# Patient Record
Sex: Male | Born: 1935 | Race: White | Hispanic: No | Marital: Married | Smoking: Former smoker
Health system: Southern US, Community
[De-identification: ages and names within clinical notes are randomized; demographics above are authoritative.]

## PROBLEM LIST (undated history)

## (undated) DIAGNOSIS — J449 Chronic obstructive pulmonary disease, unspecified: Secondary | ICD-10-CM

## (undated) DIAGNOSIS — Z85820 Personal history of malignant melanoma of skin: Secondary | ICD-10-CM

## (undated) DIAGNOSIS — I10 Essential (primary) hypertension: Secondary | ICD-10-CM

## (undated) DIAGNOSIS — G8194 Hemiplegia, unspecified affecting left nondominant side: Secondary | ICD-10-CM

## (undated) DIAGNOSIS — M25559 Pain in unspecified hip: Secondary | ICD-10-CM

## (undated) DIAGNOSIS — R7881 Bacteremia: Secondary | ICD-10-CM

## (undated) DIAGNOSIS — I255 Ischemic cardiomyopathy: Secondary | ICD-10-CM

## (undated) DIAGNOSIS — K219 Gastro-esophageal reflux disease without esophagitis: Secondary | ICD-10-CM

## (undated) DIAGNOSIS — E079 Disorder of thyroid, unspecified: Secondary | ICD-10-CM

## (undated) DIAGNOSIS — I639 Cerebral infarction, unspecified: Secondary | ICD-10-CM

## (undated) DIAGNOSIS — R1310 Dysphagia, unspecified: Secondary | ICD-10-CM

## (undated) DIAGNOSIS — G8190 Hemiplegia, unspecified affecting unspecified side: Secondary | ICD-10-CM

## (undated) DIAGNOSIS — E119 Type 2 diabetes mellitus without complications: Secondary | ICD-10-CM

## (undated) DIAGNOSIS — M1990 Unspecified osteoarthritis, unspecified site: Secondary | ICD-10-CM

## (undated) DIAGNOSIS — I4891 Unspecified atrial fibrillation: Secondary | ICD-10-CM

## (undated) DIAGNOSIS — R0789 Other chest pain: Secondary | ICD-10-CM

## (undated) DIAGNOSIS — R21 Rash and other nonspecific skin eruption: Secondary | ICD-10-CM

## (undated) DIAGNOSIS — C801 Malignant (primary) neoplasm, unspecified: Secondary | ICD-10-CM

## (undated) DIAGNOSIS — Z7189 Other specified counseling: Secondary | ICD-10-CM

## (undated) DIAGNOSIS — I2510 Atherosclerotic heart disease of native coronary artery without angina pectoris: Secondary | ICD-10-CM

## (undated) HISTORY — DX: Other specified counseling: Z71.89

## (undated) HISTORY — PX: CORONARY STENT PLACEMENT: SHX1402

## (undated) HISTORY — PX: BACK SURGERY: SHX140

## (undated) HISTORY — PX: SPLENECTOMY: SUR1306

## (undated) HISTORY — DX: Hemiplegia, unspecified affecting left nondominant side: G81.94

## (undated) HISTORY — DX: Rash and other nonspecific skin eruption: R21

## (undated) HISTORY — PX: BRAIN SURGERY: SHX531

## (undated) HISTORY — DX: Pain in unspecified hip: M25.559

## (undated) HISTORY — DX: Other chest pain: R07.89

## (undated) HISTORY — DX: Bacteremia: R78.81

## (undated) HISTORY — PX: CARDIAC SURGERY: SHX584

---

## 2001-10-09 ENCOUNTER — Emergency Department (HOSPITAL_COMMUNITY): Admission: EM | Admit: 2001-10-09 | Discharge: 2001-10-09 | Payer: Self-pay | Admitting: Emergency Medicine

## 2013-03-13 DIAGNOSIS — J209 Acute bronchitis, unspecified: Secondary | ICD-10-CM | POA: Insufficient documentation

## 2013-03-13 DIAGNOSIS — J189 Pneumonia, unspecified organism: Secondary | ICD-10-CM | POA: Insufficient documentation

## 2013-06-16 ENCOUNTER — Encounter (HOSPITAL_COMMUNITY): Payer: Self-pay | Admitting: Emergency Medicine

## 2013-06-16 ENCOUNTER — Emergency Department (HOSPITAL_COMMUNITY)
Admission: EM | Admit: 2013-06-16 | Discharge: 2013-06-16 | Disposition: A | Discharge status: Home / Self Care | Payer: Medicare Other | Attending: Emergency Medicine | Admitting: Emergency Medicine

## 2013-06-16 ENCOUNTER — Emergency Department (HOSPITAL_COMMUNITY): Payer: Medicare Other

## 2013-06-16 DIAGNOSIS — Z87891 Personal history of nicotine dependence: Secondary | ICD-10-CM | POA: Insufficient documentation

## 2013-06-16 DIAGNOSIS — I10 Essential (primary) hypertension: Secondary | ICD-10-CM | POA: Insufficient documentation

## 2013-06-16 DIAGNOSIS — M129 Arthropathy, unspecified: Secondary | ICD-10-CM | POA: Insufficient documentation

## 2013-06-16 DIAGNOSIS — R0600 Dyspnea, unspecified: Secondary | ICD-10-CM

## 2013-06-16 DIAGNOSIS — Z9889 Other specified postprocedural states: Secondary | ICD-10-CM | POA: Insufficient documentation

## 2013-06-16 DIAGNOSIS — J441 Chronic obstructive pulmonary disease with (acute) exacerbation: Secondary | ICD-10-CM | POA: Insufficient documentation

## 2013-06-16 DIAGNOSIS — Z9861 Coronary angioplasty status: Secondary | ICD-10-CM | POA: Insufficient documentation

## 2013-06-16 DIAGNOSIS — R079 Chest pain, unspecified: Secondary | ICD-10-CM

## 2013-06-16 DIAGNOSIS — E119 Type 2 diabetes mellitus without complications: Secondary | ICD-10-CM | POA: Insufficient documentation

## 2013-06-16 HISTORY — DX: Unspecified osteoarthritis, unspecified site: M19.90

## 2013-06-16 HISTORY — DX: Essential (primary) hypertension: I10

## 2013-06-16 HISTORY — DX: Type 2 diabetes mellitus without complications: E11.9

## 2013-06-16 HISTORY — DX: Chronic obstructive pulmonary disease, unspecified: J44.9

## 2013-06-16 LAB — BASIC METABOLIC PANEL
BUN: 19 mg/dL (ref 6–23)
CO2: 26 mEq/L (ref 19–32)
CREATININE: 1.18 mg/dL (ref 0.50–1.35)
Calcium: 9.4 mg/dL (ref 8.4–10.5)
Chloride: 101 mEq/L (ref 96–112)
GFR calc Af Amer: 67 mL/min — ABNORMAL LOW (ref >90)
GFR, EST NON AFRICAN AMERICAN: 58 mL/min — AB (ref >90)
Glucose, Bld: 124 mg/dL — ABNORMAL HIGH (ref 70–99)
Potassium: 4.6 mEq/L (ref 3.7–5.3)
Sodium: 140 mEq/L (ref 137–147)

## 2013-06-16 LAB — TROPONIN I
Troponin I: <0.3 ng/mL (ref <0.30)
Troponin I: <0.3 ng/mL (ref <0.30)

## 2013-06-16 LAB — LACTIC ACID, PLASMA: Lactic Acid, Venous: 1.7 mmol/L (ref 0.5–2.2)

## 2013-06-16 LAB — CBC
HCT: 41.1 % (ref 39.0–52.0)
HEMOGLOBIN: 13.2 g/dL (ref 13.0–17.0)
MCH: 27.8 pg (ref 26.0–34.0)
MCHC: 32.1 g/dL (ref 30.0–36.0)
MCV: 86.5 fL (ref 78.0–100.0)
Platelets: 322 10*3/uL (ref 150–400)
RBC: 4.75 MIL/uL (ref 4.22–5.81)
RDW: 18.5 % — ABNORMAL HIGH (ref 11.5–15.5)
WBC: 12.6 10*3/uL — ABNORMAL HIGH (ref 4.0–10.5)

## 2013-06-16 LAB — PRO B NATRIURETIC PEPTIDE: Pro B Natriuretic peptide (BNP): 568.3 pg/mL — ABNORMAL HIGH (ref 0–450)

## 2013-06-16 MED ORDER — PREDNISONE 20 MG PO TABS
60.0000 mg | ORAL_TABLET | ORAL | Status: AC
  Administered 2013-06-16: 60 mg via ORAL
  Filled 2013-06-16: qty 3

## 2013-06-16 MED ORDER — ALBUTEROL SULFATE HFA 108 (90 BASE) MCG/ACT IN AERS: 1.0000 | INHALATION_SPRAY | Freq: Four times a day (QID) | RESPIRATORY_TRACT | Status: DC | PRN

## 2013-06-16 MED ORDER — PREDNISONE 20 MG PO TABS: 60.0000 mg | ORAL_TABLET | ORAL | Status: AC

## 2013-06-16 MED ORDER — ASPIRIN 325 MG PO TABS
325.0000 mg | ORAL_TABLET | ORAL | Status: AC
  Administered 2013-06-16: 325 mg via ORAL
  Filled 2013-06-16: qty 1

## 2013-06-16 NOTE — ED Notes (Signed)
Dr.Lockwood at bedside  

## 2013-06-16 NOTE — ED Notes (Signed)
Pt states he has bilateral toe (all toes) numbness and tingling.  Pt has free movement of toes.  Pt states he has had this feeling in the past, usually occurs after the chest pain subsides.  MD Vanita Panda notified.

## 2013-06-16 NOTE — Discharge Instructions (Signed)
As discussed, your evaluation today has been largely reassuring.  But, it is important that you monitor your condition carefully, and do not hesitate to return to the ED if you develop new, or concerning changes in your condition.  Otherwise, please follow-up with your physician for appropriate ongoing care.  Please take all medication as directed.   Chest Pain (Nonspecific) It is often hard to give a specific diagnosis for the cause of chest pain. There is always a chance that your pain could be related to something serious, such as a heart attack or a blood clot in the lungs. You need to follow up with your caregiver for further evaluation. CAUSES   Heartburn.  Pneumonia or bronchitis.  Anxiety or stress.  Inflammation around your heart (pericarditis) or lung (pleuritis or pleurisy).  A blood clot in the lung.  A collapsed lung (pneumothorax). It can develop suddenly on its own (spontaneous pneumothorax) or from injury (trauma) to the chest.  Shingles infection (herpes zoster virus). The chest wall is composed of bones, muscles, and cartilage. Any of these can be the source of the pain.  The bones can be bruised by injury.  The muscles or cartilage can be strained by coughing or overwork.  The cartilage can be affected by inflammation and become sore (costochondritis). DIAGNOSIS  Lab tests or other studies, such as X-rays, electrocardiography, stress testing, or cardiac imaging, may be needed to find the cause of your pain.  TREATMENT   Treatment depends on what may be causing your chest pain. Treatment may include:  Acid blockers for heartburn.  Anti-inflammatory medicine.  Pain medicine for inflammatory conditions.  Antibiotics if an infection is present.  You may be advised to change lifestyle habits. This includes stopping smoking and avoiding alcohol, caffeine, and chocolate.  You may be advised to keep your head raised (elevated) when sleeping. This reduces the  chance of acid going backward from your stomach into your esophagus.  Most of the time, nonspecific chest pain will improve within 2 to 3 days with rest and mild pain medicine. HOME CARE INSTRUCTIONS   If antibiotics were prescribed, take your antibiotics as directed. Finish them even if you start to feel better.  For the next few days, avoid physical activities that bring on chest pain. Continue physical activities as directed.  Do not smoke.  Avoid drinking alcohol.  Only take over-the-counter or prescription medicine for pain, discomfort, or fever as directed by your caregiver.  Follow your caregiver's suggestions for further testing if your chest pain does not go away.  Keep any follow-up appointments you made. If you do not go to an appointment, you could develop lasting (chronic) problems with pain. If there is any problem keeping an appointment, you must call to reschedule. SEEK MEDICAL CARE IF:   You think you are having problems from the medicine you are taking. Read your medicine instructions carefully.  Your chest pain does not go away, even after treatment.  You develop a rash with blisters on your chest. SEEK IMMEDIATE MEDICAL CARE IF:   You have increased chest pain or pain that spreads to your arm, neck, jaw, back, or abdomen.  You develop shortness of breath, an increasing cough, or you are coughing up blood.  You have severe back or abdominal pain, feel nauseous, or vomit.  You develop severe weakness, fainting, or chills.  You have a fever. THIS IS AN EMERGENCY. Do not wait to see if the pain will go away. Get medical help  at once. Call your local emergency services (911 in [place] Do not drive yourself to the hospital. MAKE SURE YOU:   Understand these instructions.  Will watch your condition.  Will get help right away if you are not doing well or get worse. Document Released: 12/16/2004 Document Revised: 05/31/2011 Document Reviewed:  10/12/2007 [facility] Patient Information 2014 [place].

## 2013-06-16 NOTE — ED Notes (Signed)
Pt presents from home via [facility] with c/o of chest pain that started at 2am and again at 6am, pt was awoken from sleep.  Pt did take 1 Nitro with each episode of chest pain.  Per EMS pt was having wheezing in all lung fields, given 5mg  Albuterol in route.  Pt was c/o of radiating pain down the left arm, SOB, and dizziness.   In the ED, pt is denying chest pain, no radiation.  Pt has mild expiratory wheezing present.  Pt denies being sick, no fevers.

## 2013-06-16 NOTE — ED Provider Notes (Signed)
CSN: [id number]     Arrival date & time 06/16/13  0703 History   First MD Initiated Contact with Patient 06/16/13 [phone]     Chief Complaint  Patient presents with  . Chest Pain  . Shortness of Breath     HPI  Patient presents with CP / SOB. Episode began approximately 5 hours prior to calling EMS.  The patient awoke with dyspnea, left-sided chest pain that radiated to his left arm. Symptoms improved with nitroglycerin, and then it recurred once more with similar resolution following nitroglycerin. On my exam the patient has no chest pain, does complain of mild ongoing dyspnea. Also complains of mild dizziness, with no syncope, no vomiting, no incontinence. Patient received one albuterol treatment in route via EMS, with mild improvement in his dyspnea.   Past Medical History  Diagnosis Date  . Diabetes mellitus without complication   . Hypertension   . Arthritis   . COPD (chronic obstructive pulmonary disease)    Past Surgical History  Procedure Laterality Date  . Coronary stent placement    . Cardiac surgery    . Brain surgery     History reviewed. No pertinent family history. History  Substance Use Topics  . Smoking status: Former Research scientist (life sciences)  . Smokeless tobacco: Never Used  . Alcohol Use: No    Review of Systems  Constitutional:       Per HPI, otherwise negative  HENT:       Per HPI, otherwise negative  Respiratory:       Per HPI, otherwise negative  Cardiovascular:       Per HPI, otherwise negative  Gastrointestinal: Negative for vomiting.  Endocrine:       Negative aside from HPI  Genitourinary:       Neg aside from HPI   Musculoskeletal:       Per HPI, otherwise negative  Skin: Negative.   Neurological: Negative for syncope.      Allergies  Review of patient's allergies indicates no known allergies.  Home Medications  No current outpatient prescriptions on file. BP 93/41  Temp(Src) 97.5 F (36.4 C) (Oral)  Resp 16  SpO2 96% Physical Exam   Nursing note and vitals reviewed. Constitutional: He is oriented to person, place, and time. He appears well-developed. No distress.  HENT:  Head: Normocephalic and atraumatic.  Eyes: Conjunctivae and EOM are normal.  Cardiovascular: Normal rate and regular rhythm.   Pulmonary/Chest: Effort normal. No stridor. No respiratory distress.  Abdominal: He exhibits no distension.  Musculoskeletal: He exhibits no edema and no tenderness.  Neurological: He is alert and oriented to person, place, and time.  Skin: Skin is warm and dry.  Psychiatric: He has a normal mood and affect.    ED Course  Procedures (including critical care time) Labs Review Labs Reviewed  CBC  BASIC METABOLIC PANEL  PRO B NATRIURETIC PEPTIDE  TROPONIN I  LACTIC ACID, PLASMA   Imaging Review No results found.   EKG Interpretation   Date/Time:  Saturday June 16 2013 07:09:04 EDT Ventricular Rate:  68 PR Interval:  180 QRS Duration: 96 QT Interval:  453 QTC Calculation: 482 R Axis:   79 Text Interpretation:  Sinus rhythm Low voltage, extremity leads  Nonspecific T abnormalities, lateral leads Borderline prolonged QT  interval Sinus rhythm T wave abnormality Artifact Abnormal ekg Confirmed  by Carmin Muskrat  MD ([id number]) on 06/16/2013 7:49:01 AM       9:19 AM On re-exam the patient appears comfortable. No new  CP VS improved Repeat trop at 1200  12:57 PM Trop #2 - negative No new complaints, VS stable  1:19 PM Patient sitting upright, in no complaints.  We had a lengthy conversation about presumptive diagnosis of COPD exacerbation, and the patient remained chest pain free. Patient will follow up with his cardiologist in 4 days.  He voices understanding of return precautions, as do his family members.   MDM   Final diagnoses:  Dyspnea  Chest pain    Patient presents after an episode of chest pain, but with more persistent dyspnea.  Patient has history of COPD, and asbestos  exposure. Patient's evaluation here including EKG, serial troponins, labs was largely reassuring, and he remained chest pain-free throughout his stay here. With his improvement here, he was discharged to follow up as previously scheduled with his cardiologist, after initiation of steroids, breathing treatment.     Carmin Muskrat, MD 06/16/13 1321

## 2013-06-20 DIAGNOSIS — I2510 Atherosclerotic heart disease of native coronary artery without angina pectoris: Secondary | ICD-10-CM | POA: Insufficient documentation

## 2013-07-20 DIAGNOSIS — I350 Nonrheumatic aortic (valve) stenosis: Secondary | ICD-10-CM | POA: Insufficient documentation

## 2013-07-20 DIAGNOSIS — E119 Type 2 diabetes mellitus without complications: Secondary | ICD-10-CM | POA: Insufficient documentation

## 2013-08-02 DIAGNOSIS — E1151 Type 2 diabetes mellitus with diabetic peripheral angiopathy without gangrene: Secondary | ICD-10-CM | POA: Insufficient documentation

## 2013-11-14 DIAGNOSIS — I714 Abdominal aortic aneurysm, without rupture, unspecified: Secondary | ICD-10-CM | POA: Insufficient documentation

## 2014-03-22 DIAGNOSIS — I639 Cerebral infarction, unspecified: Secondary | ICD-10-CM

## 2014-03-22 HISTORY — DX: Cerebral infarction, unspecified: I63.9

## 2014-03-28 DIAGNOSIS — I252 Old myocardial infarction: Secondary | ICD-10-CM

## 2014-03-28 DIAGNOSIS — I2510 Atherosclerotic heart disease of native coronary artery without angina pectoris: Secondary | ICD-10-CM | POA: Insufficient documentation

## 2014-04-21 DIAGNOSIS — IMO0002 Reserved for concepts with insufficient information to code with codable children: Secondary | ICD-10-CM | POA: Insufficient documentation

## 2014-06-12 DIAGNOSIS — I209 Angina pectoris, unspecified: Secondary | ICD-10-CM | POA: Diagnosis not present

## 2014-06-12 DIAGNOSIS — D649 Anemia, unspecified: Secondary | ICD-10-CM | POA: Diagnosis not present

## 2014-06-12 DIAGNOSIS — J449 Chronic obstructive pulmonary disease, unspecified: Secondary | ICD-10-CM | POA: Diagnosis not present

## 2014-06-12 DIAGNOSIS — R1310 Dysphagia, unspecified: Secondary | ICD-10-CM | POA: Diagnosis not present

## 2014-06-12 DIAGNOSIS — I69322 Dysarthria following cerebral infarction: Secondary | ICD-10-CM | POA: Diagnosis not present

## 2014-06-12 DIAGNOSIS — G8190 Hemiplegia, unspecified affecting unspecified side: Secondary | ICD-10-CM | POA: Diagnosis not present

## 2014-06-12 DIAGNOSIS — J189 Pneumonia, unspecified organism: Secondary | ICD-10-CM | POA: Diagnosis not present

## 2014-06-12 DIAGNOSIS — E559 Vitamin D deficiency, unspecified: Secondary | ICD-10-CM | POA: Diagnosis not present

## 2014-06-12 DIAGNOSIS — Z79899 Other long term (current) drug therapy: Secondary | ICD-10-CM | POA: Diagnosis not present

## 2014-06-12 DIAGNOSIS — E119 Type 2 diabetes mellitus without complications: Secondary | ICD-10-CM | POA: Diagnosis not present

## 2014-06-12 DIAGNOSIS — E785 Hyperlipidemia, unspecified: Secondary | ICD-10-CM | POA: Diagnosis not present

## 2014-06-12 DIAGNOSIS — I639 Cerebral infarction, unspecified: Secondary | ICD-10-CM | POA: Diagnosis not present

## 2014-06-12 DIAGNOSIS — D72829 Elevated white blood cell count, unspecified: Secondary | ICD-10-CM | POA: Diagnosis not present

## 2014-06-12 DIAGNOSIS — E039 Hypothyroidism, unspecified: Secondary | ICD-10-CM | POA: Diagnosis not present

## 2014-06-12 DIAGNOSIS — G4700 Insomnia, unspecified: Secondary | ICD-10-CM | POA: Diagnosis not present

## 2014-06-13 DIAGNOSIS — J449 Chronic obstructive pulmonary disease, unspecified: Secondary | ICD-10-CM | POA: Diagnosis not present

## 2014-06-13 DIAGNOSIS — J189 Pneumonia, unspecified organism: Secondary | ICD-10-CM | POA: Diagnosis not present

## 2014-06-13 DIAGNOSIS — E559 Vitamin D deficiency, unspecified: Secondary | ICD-10-CM | POA: Diagnosis not present

## 2014-06-13 DIAGNOSIS — D649 Anemia, unspecified: Secondary | ICD-10-CM | POA: Diagnosis not present

## 2014-06-14 DIAGNOSIS — R918 Other nonspecific abnormal finding of lung field: Secondary | ICD-10-CM | POA: Diagnosis not present

## 2014-06-14 DIAGNOSIS — J189 Pneumonia, unspecified organism: Secondary | ICD-10-CM | POA: Diagnosis not present

## 2014-06-14 DIAGNOSIS — E119 Type 2 diabetes mellitus without complications: Secondary | ICD-10-CM | POA: Diagnosis not present

## 2014-06-14 DIAGNOSIS — I10 Essential (primary) hypertension: Secondary | ICD-10-CM | POA: Diagnosis not present

## 2014-06-15 DIAGNOSIS — J449 Chronic obstructive pulmonary disease, unspecified: Secondary | ICD-10-CM | POA: Diagnosis not present

## 2014-06-15 DIAGNOSIS — D649 Anemia, unspecified: Secondary | ICD-10-CM | POA: Diagnosis not present

## 2014-06-15 DIAGNOSIS — G4700 Insomnia, unspecified: Secondary | ICD-10-CM | POA: Diagnosis not present

## 2014-06-15 DIAGNOSIS — R1310 Dysphagia, unspecified: Secondary | ICD-10-CM | POA: Diagnosis not present

## 2014-06-15 DIAGNOSIS — J189 Pneumonia, unspecified organism: Secondary | ICD-10-CM | POA: Diagnosis not present

## 2014-06-15 DIAGNOSIS — D72829 Elevated white blood cell count, unspecified: Secondary | ICD-10-CM | POA: Diagnosis not present

## 2014-06-18 ENCOUNTER — Ambulatory Visit (HOSPITAL_COMMUNITY)
Admission: RE | Admit: 2014-06-18 | Discharge: 2014-06-18 | Disposition: A | Discharge status: Home / Self Care | Payer: Medicare Other | Source: Ambulatory Visit | Attending: Internal Medicine | Admitting: Internal Medicine

## 2014-06-18 ENCOUNTER — Encounter (HOSPITAL_COMMUNITY)
Admission: RE | Admit: 2014-06-18 | Discharge: 2014-06-18 | Disposition: A | Discharge status: Home / Self Care | Payer: Medicare Other | Source: Ambulatory Visit | Attending: Internal Medicine | Admitting: Internal Medicine

## 2014-06-18 DIAGNOSIS — G8190 Hemiplegia, unspecified affecting unspecified side: Secondary | ICD-10-CM | POA: Diagnosis not present

## 2014-06-18 DIAGNOSIS — Z5189 Encounter for other specified aftercare: Secondary | ICD-10-CM | POA: Diagnosis present

## 2014-06-18 DIAGNOSIS — I209 Angina pectoris, unspecified: Secondary | ICD-10-CM | POA: Diagnosis not present

## 2014-06-18 DIAGNOSIS — D72829 Elevated white blood cell count, unspecified: Secondary | ICD-10-CM | POA: Diagnosis not present

## 2014-06-18 DIAGNOSIS — R1310 Dysphagia, unspecified: Secondary | ICD-10-CM | POA: Diagnosis not present

## 2014-06-18 DIAGNOSIS — J189 Pneumonia, unspecified organism: Secondary | ICD-10-CM | POA: Insufficient documentation

## 2014-06-18 DIAGNOSIS — G4700 Insomnia, unspecified: Secondary | ICD-10-CM | POA: Diagnosis not present

## 2014-06-18 DIAGNOSIS — J449 Chronic obstructive pulmonary disease, unspecified: Secondary | ICD-10-CM | POA: Diagnosis not present

## 2014-06-18 MED ORDER — SODIUM CHLORIDE 0.9 % IJ SOLN: 10.0000 mL | INTRAMUSCULAR | Status: DC | PRN

## 2014-06-18 NOTE — Discharge Instructions (Signed)
Peripherally Inserted Central Catheter/Midline Placement  The IV Nurse has discussed with the patient and/or persons authorized to consent for the patient, the purpose of this procedure and the potential benefits and risks involved with this procedure.  The benefits include less needle sticks, lab draws from the catheter and patient may be discharged home with the catheter.  Risks include, but not limited to, infection, bleeding, blood clot (thrombus formation), and puncture of an artery; nerve damage and irregular heat beat.  Alternatives to this procedure were also discussed.  PICC/Midline Placement Documentation  PICC / Midline Single Lumen 73/22/02 PICC Right Basilic 47 cm 0 cm (Active)  Indication for Insertion or Continuance of Line Prolonged intravenous therapies 06/18/2014 10:28 AM  Exposed Catheter (cm) 0 cm 06/18/2014 10:28 AM  Site Assessment Clean;Dry;Intact 06/18/2014 10:28 AM  Line Status Flushed;Saline locked;Blood return noted 06/18/2014 10:28 AM  Dressing Type Transparent;Securing device 06/18/2014 10:28 AM  Dressing Status Clean;Dry;Intact;Antimicrobial disc in place 06/18/2014 10:28 AM  Line Care Connections checked and tightened 06/18/2014 10:28 AM  Dressing Intervention New dressing 06/18/2014 10:28 AM  Dressing Change Due 06/25/14 06/18/2014 10:28 AM     PICC Insertion, Care After Refer to this sheet in the next few weeks. These instructions provide you with information on caring for yourself after your procedure. Your health care provider may also give you more specific instructions. Your treatment has been planned according to current medical practices, but problems sometimes occur. Call your health care provider if you have any problems or questions after your procedure. WHAT TO EXPECT AFTER THE PROCEDURE After your procedure, it is typical to have the following:  Mild discomfort at the insertion site. This should not last more than a day. HOME CARE INSTRUCTIONS  Rest at  home for the remainder of the day after the procedure.  You may bend your arm and move it freely. If your PICC is near or at the bend of your elbow, avoid activity with repeated motion at the elbow.  Avoid lifting heavy objects as instructed by your health care provider.  Avoid using a crutch with the arm on the same side as your PICC. You may need to use a walker. Bandage Care  Keep your PICC bandage (dressing) clean and dry to prevent infection.  Ask your health care provider when you may shower. To keep the dressing dry, cover the PICC with plastic wrap and tape before showering. If the dressing does become wet, replace it right after the shower.  Do not soak in the bath, swim, or use hot tubs when you have a PICC.  Change the PICC dressing as instructed by your health care provider.  Change your PICC dressing if it becomes loose or wet. General PICC Care  Check the PICC insertion site daily for leakage, redness, swelling, or pain.  Flush the PICC as directed by your health care provider. Let your health care provider know right away if the PICC is difficult to flush or does not flush. Do not use force to flush the PICC.  Do not use a syringe that is less than 10 mL to flush the PICC.  Never pull or tug on the PICC.  Avoid blood pressure checks on the arm with the PICC.  Keep your PICC identification card with you at all times.  Do not take the PICC out yourself. Only a trained health care professional should remove the PICC. SEEK MEDICAL CARE IF:  You have pain in your arm, ear, face, or teeth.  You have fever or chills.  You have drainage from the PICC insertion site.  You have redness or palpate a "cord" around the PICC insertion site.  You cannot flush the catheter. SEEK IMMEDIATE MEDICAL CARE IF:  You have swelling in the arm in which the PICC is inserted. Document Released: 12/27/2012 Document Revised: 03/13/2013 Document Reviewed: 12/27/2012 [facility] Patient  Information 2015 [place], [place]. This information is not intended to replace advice given to you by your health care provider. Make sure you discuss any questions you have with your health care provider.

## 2014-06-18 NOTE — Progress Notes (Signed)
Peripherally Inserted Central Catheter/Midline Placement  The IV Nurse has discussed with the patient and/or persons authorized to consent for the patient, the purpose of this procedure and the potential benefits and risks involved with this procedure.  The benefits include less needle sticks, lab draws from the catheter and patient may be discharged home with the catheter.  Risks include, but not limited to, infection, bleeding, blood clot (thrombus formation), and puncture of an artery; nerve damage and irregular heat beat.  Alternatives to this procedure were also discussed.  PICC/Midline Placement Documentation  PICC / Midline Single Lumen 16/60/60 PICC Right Basilic 47 cm 0 cm (Active)  Indication for Insertion or Continuance of Line Prolonged intravenous therapies 06/18/2014 10:28 AM  Exposed Catheter (cm) 0 cm 06/18/2014 10:28 AM  Site Assessment Clean;Dry;Intact 06/18/2014 10:28 AM  Line Status Flushed;Saline locked;Blood return noted 06/18/2014 10:28 AM  Dressing Type Transparent;Securing device 06/18/2014 10:28 AM  Dressing Status Clean;Dry;Intact;Antimicrobial disc in place 06/18/2014 10:28 AM  Line Care Connections checked and tightened 06/18/2014 10:28 AM  Dressing Intervention New dressing 06/18/2014 10:28 AM  Dressing Change Due 06/25/14 06/18/2014 10:28 AM    Placement confirmed by CXR.   Alejandro Diaz 06/18/2014, 2:21 PM

## 2014-06-19 DIAGNOSIS — R1310 Dysphagia, unspecified: Secondary | ICD-10-CM | POA: Diagnosis not present

## 2014-06-19 DIAGNOSIS — G8190 Hemiplegia, unspecified affecting unspecified side: Secondary | ICD-10-CM | POA: Diagnosis not present

## 2014-06-19 DIAGNOSIS — I639 Cerebral infarction, unspecified: Secondary | ICD-10-CM | POA: Diagnosis not present

## 2014-06-19 DIAGNOSIS — J449 Chronic obstructive pulmonary disease, unspecified: Secondary | ICD-10-CM | POA: Diagnosis not present

## 2014-06-19 DIAGNOSIS — J189 Pneumonia, unspecified organism: Secondary | ICD-10-CM | POA: Diagnosis not present

## 2014-06-19 DIAGNOSIS — G4700 Insomnia, unspecified: Secondary | ICD-10-CM | POA: Diagnosis not present

## 2014-06-19 DIAGNOSIS — D72829 Elevated white blood cell count, unspecified: Secondary | ICD-10-CM | POA: Diagnosis not present

## 2014-06-19 DIAGNOSIS — I209 Angina pectoris, unspecified: Secondary | ICD-10-CM | POA: Diagnosis not present

## 2014-06-19 DIAGNOSIS — Z79899 Other long term (current) drug therapy: Secondary | ICD-10-CM | POA: Diagnosis not present

## 2014-06-19 DIAGNOSIS — D649 Anemia, unspecified: Secondary | ICD-10-CM | POA: Diagnosis not present

## 2014-06-19 DIAGNOSIS — E785 Hyperlipidemia, unspecified: Secondary | ICD-10-CM | POA: Diagnosis not present

## 2014-06-21 DIAGNOSIS — E11649 Type 2 diabetes mellitus with hypoglycemia without coma: Secondary | ICD-10-CM | POA: Diagnosis not present

## 2014-06-21 DIAGNOSIS — R630 Anorexia: Secondary | ICD-10-CM | POA: Diagnosis not present

## 2014-06-21 DIAGNOSIS — J984 Other disorders of lung: Secondary | ICD-10-CM | POA: Diagnosis not present

## 2014-06-21 DIAGNOSIS — I6350 Cerebral infarction due to unspecified occlusion or stenosis of unspecified cerebral artery: Secondary | ICD-10-CM | POA: Diagnosis not present

## 2014-06-21 DIAGNOSIS — I69354 Hemiplegia and hemiparesis following cerebral infarction affecting left non-dominant side: Secondary | ICD-10-CM | POA: Diagnosis not present

## 2014-06-21 DIAGNOSIS — R110 Nausea: Secondary | ICD-10-CM | POA: Diagnosis not present

## 2014-06-21 DIAGNOSIS — Z931 Gastrostomy status: Secondary | ICD-10-CM | POA: Diagnosis not present

## 2014-06-21 DIAGNOSIS — I4891 Unspecified atrial fibrillation: Secondary | ICD-10-CM | POA: Diagnosis not present

## 2014-06-21 DIAGNOSIS — I248 Other forms of acute ischemic heart disease: Secondary | ICD-10-CM | POA: Diagnosis not present

## 2014-06-21 DIAGNOSIS — I481 Persistent atrial fibrillation: Secondary | ICD-10-CM | POA: Diagnosis not present

## 2014-06-21 DIAGNOSIS — E079 Disorder of thyroid, unspecified: Secondary | ICD-10-CM | POA: Diagnosis not present

## 2014-06-21 DIAGNOSIS — M19012 Primary osteoarthritis, left shoulder: Secondary | ICD-10-CM | POA: Diagnosis not present

## 2014-06-21 DIAGNOSIS — R5381 Other malaise: Secondary | ICD-10-CM | POA: Diagnosis not present

## 2014-06-21 DIAGNOSIS — M25512 Pain in left shoulder: Secondary | ICD-10-CM | POA: Diagnosis not present

## 2014-06-21 DIAGNOSIS — R1013 Epigastric pain: Secondary | ICD-10-CM | POA: Diagnosis present

## 2014-06-21 DIAGNOSIS — R4182 Altered mental status, unspecified: Secondary | ICD-10-CM | POA: Diagnosis not present

## 2014-06-21 DIAGNOSIS — R197 Diarrhea, unspecified: Secondary | ICD-10-CM | POA: Diagnosis not present

## 2014-06-21 DIAGNOSIS — J189 Pneumonia, unspecified organism: Secondary | ICD-10-CM | POA: Diagnosis not present

## 2014-06-21 DIAGNOSIS — R1314 Dysphagia, pharyngoesophageal phase: Secondary | ICD-10-CM | POA: Diagnosis present

## 2014-06-21 DIAGNOSIS — I214 Non-ST elevation (NSTEMI) myocardial infarction: Secondary | ICD-10-CM | POA: Diagnosis not present

## 2014-06-21 DIAGNOSIS — I69391 Dysphagia following cerebral infarction: Secondary | ICD-10-CM | POA: Diagnosis not present

## 2014-06-21 DIAGNOSIS — R0989 Other specified symptoms and signs involving the circulatory and respiratory systems: Secondary | ICD-10-CM | POA: Diagnosis not present

## 2014-06-21 DIAGNOSIS — R079 Chest pain, unspecified: Secondary | ICD-10-CM | POA: Diagnosis not present

## 2014-06-21 DIAGNOSIS — K219 Gastro-esophageal reflux disease without esophagitis: Secondary | ICD-10-CM | POA: Diagnosis present

## 2014-06-21 DIAGNOSIS — G4700 Insomnia, unspecified: Secondary | ICD-10-CM | POA: Diagnosis not present

## 2014-06-21 DIAGNOSIS — I639 Cerebral infarction, unspecified: Secondary | ICD-10-CM | POA: Diagnosis not present

## 2014-06-21 DIAGNOSIS — R0789 Other chest pain: Secondary | ICD-10-CM | POA: Diagnosis present

## 2014-06-21 DIAGNOSIS — E0840 Diabetes mellitus due to underlying condition with diabetic neuropathy, unspecified: Secondary | ICD-10-CM | POA: Diagnosis not present

## 2014-06-21 DIAGNOSIS — R2681 Unsteadiness on feet: Secondary | ICD-10-CM | POA: Diagnosis not present

## 2014-06-21 DIAGNOSIS — Z9081 Acquired absence of spleen: Secondary | ICD-10-CM | POA: Diagnosis present

## 2014-06-21 DIAGNOSIS — W07XXXA Fall from chair, initial encounter: Secondary | ICD-10-CM | POA: Diagnosis not present

## 2014-06-21 DIAGNOSIS — N182 Chronic kidney disease, stage 2 (mild): Secondary | ICD-10-CM | POA: Diagnosis not present

## 2014-06-21 DIAGNOSIS — M1990 Unspecified osteoarthritis, unspecified site: Secondary | ICD-10-CM | POA: Diagnosis present

## 2014-06-21 DIAGNOSIS — J90 Pleural effusion, not elsewhere classified: Secondary | ICD-10-CM | POA: Diagnosis not present

## 2014-06-21 DIAGNOSIS — M79642 Pain in left hand: Secondary | ICD-10-CM | POA: Diagnosis not present

## 2014-06-21 DIAGNOSIS — G8190 Hemiplegia, unspecified affecting unspecified side: Secondary | ICD-10-CM | POA: Diagnosis not present

## 2014-06-21 DIAGNOSIS — Z7901 Long term (current) use of anticoagulants: Secondary | ICD-10-CM | POA: Diagnosis not present

## 2014-06-21 DIAGNOSIS — F339 Major depressive disorder, recurrent, unspecified: Secondary | ICD-10-CM | POA: Diagnosis not present

## 2014-06-21 DIAGNOSIS — M79602 Pain in left arm: Secondary | ICD-10-CM | POA: Diagnosis not present

## 2014-06-21 DIAGNOSIS — Z79899 Other long term (current) drug therapy: Secondary | ICD-10-CM | POA: Diagnosis not present

## 2014-06-21 DIAGNOSIS — R278 Other lack of coordination: Secondary | ICD-10-CM | POA: Diagnosis not present

## 2014-06-21 DIAGNOSIS — Z7982 Long term (current) use of aspirin: Secondary | ICD-10-CM | POA: Diagnosis not present

## 2014-06-21 DIAGNOSIS — G4730 Sleep apnea, unspecified: Secondary | ICD-10-CM | POA: Diagnosis not present

## 2014-06-21 DIAGNOSIS — J029 Acute pharyngitis, unspecified: Secondary | ICD-10-CM | POA: Diagnosis not present

## 2014-06-21 DIAGNOSIS — Z87891 Personal history of nicotine dependence: Secondary | ICD-10-CM | POA: Diagnosis not present

## 2014-06-21 DIAGNOSIS — F329 Major depressive disorder, single episode, unspecified: Secondary | ICD-10-CM | POA: Diagnosis not present

## 2014-06-21 DIAGNOSIS — Z955 Presence of coronary angioplasty implant and graft: Secondary | ICD-10-CM | POA: Diagnosis not present

## 2014-06-21 DIAGNOSIS — E785 Hyperlipidemia, unspecified: Secondary | ICD-10-CM | POA: Diagnosis not present

## 2014-06-21 DIAGNOSIS — Z66 Do not resuscitate: Secondary | ICD-10-CM | POA: Diagnosis present

## 2014-06-21 DIAGNOSIS — I69322 Dysarthria following cerebral infarction: Secondary | ICD-10-CM | POA: Diagnosis not present

## 2014-06-21 DIAGNOSIS — D72829 Elevated white blood cell count, unspecified: Secondary | ICD-10-CM | POA: Diagnosis not present

## 2014-06-21 DIAGNOSIS — M6281 Muscle weakness (generalized): Secondary | ICD-10-CM | POA: Diagnosis not present

## 2014-06-21 DIAGNOSIS — J188 Other pneumonia, unspecified organism: Secondary | ICD-10-CM | POA: Diagnosis not present

## 2014-06-21 DIAGNOSIS — R1312 Dysphagia, oropharyngeal phase: Secondary | ICD-10-CM | POA: Diagnosis not present

## 2014-06-21 DIAGNOSIS — D649 Anemia, unspecified: Secondary | ICD-10-CM | POA: Diagnosis not present

## 2014-06-21 DIAGNOSIS — I10 Essential (primary) hypertension: Secondary | ICD-10-CM | POA: Diagnosis present

## 2014-06-21 DIAGNOSIS — M25522 Pain in left elbow: Secondary | ICD-10-CM | POA: Diagnosis not present

## 2014-06-21 DIAGNOSIS — Z741 Need for assistance with personal care: Secondary | ICD-10-CM | POA: Diagnosis not present

## 2014-06-21 DIAGNOSIS — I429 Cardiomyopathy, unspecified: Secondary | ICD-10-CM | POA: Diagnosis not present

## 2014-06-21 DIAGNOSIS — R1310 Dysphagia, unspecified: Secondary | ICD-10-CM | POA: Diagnosis present

## 2014-06-21 DIAGNOSIS — I482 Chronic atrial fibrillation: Secondary | ICD-10-CM | POA: Diagnosis present

## 2014-06-21 DIAGNOSIS — I82621 Acute embolism and thrombosis of deep veins of right upper extremity: Secondary | ICD-10-CM | POA: Diagnosis not present

## 2014-06-21 DIAGNOSIS — I255 Ischemic cardiomyopathy: Secondary | ICD-10-CM | POA: Diagnosis present

## 2014-06-21 DIAGNOSIS — I480 Paroxysmal atrial fibrillation: Secondary | ICD-10-CM | POA: Diagnosis not present

## 2014-06-21 DIAGNOSIS — R262 Difficulty in walking, not elsewhere classified: Secondary | ICD-10-CM | POA: Diagnosis not present

## 2014-06-21 DIAGNOSIS — M79631 Pain in right forearm: Secondary | ICD-10-CM | POA: Diagnosis not present

## 2014-06-21 DIAGNOSIS — J449 Chronic obstructive pulmonary disease, unspecified: Secondary | ICD-10-CM | POA: Diagnosis present

## 2014-06-21 DIAGNOSIS — E119 Type 2 diabetes mellitus without complications: Secondary | ICD-10-CM | POA: Diagnosis not present

## 2014-06-21 DIAGNOSIS — Z85820 Personal history of malignant melanoma of skin: Secondary | ICD-10-CM | POA: Diagnosis not present

## 2014-06-21 DIAGNOSIS — I2510 Atherosclerotic heart disease of native coronary artery without angina pectoris: Secondary | ICD-10-CM | POA: Diagnosis present

## 2014-06-22 DIAGNOSIS — J189 Pneumonia, unspecified organism: Secondary | ICD-10-CM | POA: Diagnosis not present

## 2014-06-22 DIAGNOSIS — R110 Nausea: Secondary | ICD-10-CM | POA: Diagnosis not present

## 2014-06-22 DIAGNOSIS — R1310 Dysphagia, unspecified: Secondary | ICD-10-CM | POA: Diagnosis not present

## 2014-06-22 DIAGNOSIS — D72829 Elevated white blood cell count, unspecified: Secondary | ICD-10-CM | POA: Diagnosis not present

## 2014-06-26 DIAGNOSIS — F329 Major depressive disorder, single episode, unspecified: Secondary | ICD-10-CM | POA: Diagnosis not present

## 2014-06-26 DIAGNOSIS — D72829 Elevated white blood cell count, unspecified: Secondary | ICD-10-CM | POA: Diagnosis not present

## 2014-06-26 DIAGNOSIS — J189 Pneumonia, unspecified organism: Secondary | ICD-10-CM | POA: Diagnosis not present

## 2014-06-26 DIAGNOSIS — R630 Anorexia: Secondary | ICD-10-CM | POA: Diagnosis not present

## 2014-06-27 DIAGNOSIS — D72829 Elevated white blood cell count, unspecified: Secondary | ICD-10-CM | POA: Diagnosis not present

## 2014-06-27 DIAGNOSIS — J029 Acute pharyngitis, unspecified: Secondary | ICD-10-CM | POA: Diagnosis not present

## 2014-06-27 DIAGNOSIS — J189 Pneumonia, unspecified organism: Secondary | ICD-10-CM | POA: Diagnosis not present

## 2014-06-27 DIAGNOSIS — N182 Chronic kidney disease, stage 2 (mild): Secondary | ICD-10-CM | POA: Diagnosis not present

## 2014-06-27 DIAGNOSIS — R630 Anorexia: Secondary | ICD-10-CM | POA: Diagnosis not present

## 2014-06-29 DIAGNOSIS — N182 Chronic kidney disease, stage 2 (mild): Secondary | ICD-10-CM | POA: Diagnosis not present

## 2014-06-29 DIAGNOSIS — R630 Anorexia: Secondary | ICD-10-CM | POA: Diagnosis not present

## 2014-06-29 DIAGNOSIS — D72829 Elevated white blood cell count, unspecified: Secondary | ICD-10-CM | POA: Diagnosis not present

## 2014-06-29 DIAGNOSIS — J029 Acute pharyngitis, unspecified: Secondary | ICD-10-CM | POA: Diagnosis not present

## 2014-06-29 DIAGNOSIS — J189 Pneumonia, unspecified organism: Secondary | ICD-10-CM | POA: Diagnosis not present

## 2014-07-02 DIAGNOSIS — R110 Nausea: Secondary | ICD-10-CM | POA: Diagnosis not present

## 2014-07-02 DIAGNOSIS — R630 Anorexia: Secondary | ICD-10-CM | POA: Diagnosis not present

## 2014-07-02 DIAGNOSIS — F329 Major depressive disorder, single episode, unspecified: Secondary | ICD-10-CM | POA: Diagnosis not present

## 2014-07-02 DIAGNOSIS — G8190 Hemiplegia, unspecified affecting unspecified side: Secondary | ICD-10-CM | POA: Diagnosis not present

## 2014-07-03 DIAGNOSIS — N182 Chronic kidney disease, stage 2 (mild): Secondary | ICD-10-CM | POA: Diagnosis not present

## 2014-07-03 DIAGNOSIS — D649 Anemia, unspecified: Secondary | ICD-10-CM | POA: Diagnosis not present

## 2014-07-03 DIAGNOSIS — D72829 Elevated white blood cell count, unspecified: Secondary | ICD-10-CM | POA: Diagnosis not present

## 2014-07-03 DIAGNOSIS — J189 Pneumonia, unspecified organism: Secondary | ICD-10-CM | POA: Diagnosis not present

## 2014-07-10 DIAGNOSIS — R1310 Dysphagia, unspecified: Secondary | ICD-10-CM | POA: Diagnosis not present

## 2014-07-10 DIAGNOSIS — J449 Chronic obstructive pulmonary disease, unspecified: Secondary | ICD-10-CM | POA: Diagnosis not present

## 2014-07-10 DIAGNOSIS — D72829 Elevated white blood cell count, unspecified: Secondary | ICD-10-CM | POA: Diagnosis not present

## 2014-07-10 DIAGNOSIS — R630 Anorexia: Secondary | ICD-10-CM | POA: Diagnosis not present

## 2014-07-10 DIAGNOSIS — F329 Major depressive disorder, single episode, unspecified: Secondary | ICD-10-CM | POA: Diagnosis not present

## 2014-07-10 DIAGNOSIS — N182 Chronic kidney disease, stage 2 (mild): Secondary | ICD-10-CM | POA: Diagnosis not present

## 2014-07-11 DIAGNOSIS — D72829 Elevated white blood cell count, unspecified: Secondary | ICD-10-CM | POA: Diagnosis not present

## 2014-07-11 DIAGNOSIS — R630 Anorexia: Secondary | ICD-10-CM | POA: Diagnosis not present

## 2014-07-11 DIAGNOSIS — R1310 Dysphagia, unspecified: Secondary | ICD-10-CM | POA: Diagnosis not present

## 2014-07-11 DIAGNOSIS — J449 Chronic obstructive pulmonary disease, unspecified: Secondary | ICD-10-CM | POA: Diagnosis not present

## 2014-07-11 DIAGNOSIS — N182 Chronic kidney disease, stage 2 (mild): Secondary | ICD-10-CM | POA: Diagnosis not present

## 2014-07-11 DIAGNOSIS — F329 Major depressive disorder, single episode, unspecified: Secondary | ICD-10-CM | POA: Diagnosis not present

## 2014-07-17 DIAGNOSIS — N182 Chronic kidney disease, stage 2 (mild): Secondary | ICD-10-CM | POA: Diagnosis not present

## 2014-07-17 DIAGNOSIS — D649 Anemia, unspecified: Secondary | ICD-10-CM | POA: Diagnosis not present

## 2014-07-17 DIAGNOSIS — I10 Essential (primary) hypertension: Secondary | ICD-10-CM | POA: Diagnosis not present

## 2014-07-17 DIAGNOSIS — J449 Chronic obstructive pulmonary disease, unspecified: Secondary | ICD-10-CM | POA: Diagnosis not present

## 2014-07-24 DIAGNOSIS — D649 Anemia, unspecified: Secondary | ICD-10-CM | POA: Diagnosis not present

## 2014-07-24 DIAGNOSIS — N182 Chronic kidney disease, stage 2 (mild): Secondary | ICD-10-CM | POA: Diagnosis not present

## 2014-07-24 DIAGNOSIS — I6350 Cerebral infarction due to unspecified occlusion or stenosis of unspecified cerebral artery: Secondary | ICD-10-CM | POA: Diagnosis not present

## 2014-07-24 DIAGNOSIS — R1310 Dysphagia, unspecified: Secondary | ICD-10-CM | POA: Diagnosis not present

## 2014-07-30 DIAGNOSIS — J449 Chronic obstructive pulmonary disease, unspecified: Secondary | ICD-10-CM | POA: Diagnosis not present

## 2014-08-05 DIAGNOSIS — R5381 Other malaise: Secondary | ICD-10-CM | POA: Diagnosis not present

## 2014-08-07 DIAGNOSIS — N182 Chronic kidney disease, stage 2 (mild): Secondary | ICD-10-CM | POA: Diagnosis not present

## 2014-08-07 DIAGNOSIS — R4182 Altered mental status, unspecified: Secondary | ICD-10-CM | POA: Diagnosis not present

## 2014-08-07 DIAGNOSIS — G8190 Hemiplegia, unspecified affecting unspecified side: Secondary | ICD-10-CM | POA: Diagnosis not present

## 2014-08-07 DIAGNOSIS — D649 Anemia, unspecified: Secondary | ICD-10-CM | POA: Diagnosis not present

## 2014-08-07 DIAGNOSIS — R5381 Other malaise: Secondary | ICD-10-CM | POA: Diagnosis not present

## 2014-08-07 DIAGNOSIS — G4730 Sleep apnea, unspecified: Secondary | ICD-10-CM | POA: Diagnosis not present

## 2014-08-07 DIAGNOSIS — R1310 Dysphagia, unspecified: Secondary | ICD-10-CM | POA: Diagnosis not present

## 2014-08-07 DIAGNOSIS — R630 Anorexia: Secondary | ICD-10-CM | POA: Diagnosis not present

## 2014-08-12 DIAGNOSIS — N182 Chronic kidney disease, stage 2 (mild): Secondary | ICD-10-CM | POA: Diagnosis not present

## 2014-08-12 DIAGNOSIS — D649 Anemia, unspecified: Secondary | ICD-10-CM | POA: Diagnosis not present

## 2014-08-12 DIAGNOSIS — G4730 Sleep apnea, unspecified: Secondary | ICD-10-CM | POA: Diagnosis not present

## 2014-08-12 DIAGNOSIS — G8190 Hemiplegia, unspecified affecting unspecified side: Secondary | ICD-10-CM | POA: Diagnosis not present

## 2014-08-12 DIAGNOSIS — R630 Anorexia: Secondary | ICD-10-CM | POA: Diagnosis not present

## 2014-08-12 DIAGNOSIS — R4182 Altered mental status, unspecified: Secondary | ICD-10-CM | POA: Diagnosis not present

## 2014-08-12 DIAGNOSIS — R1310 Dysphagia, unspecified: Secondary | ICD-10-CM | POA: Diagnosis not present

## 2014-08-12 DIAGNOSIS — R5381 Other malaise: Secondary | ICD-10-CM | POA: Diagnosis not present

## 2014-08-14 DIAGNOSIS — W07XXXA Fall from chair, initial encounter: Secondary | ICD-10-CM | POA: Diagnosis not present

## 2014-08-14 DIAGNOSIS — M25512 Pain in left shoulder: Secondary | ICD-10-CM | POA: Diagnosis not present

## 2014-08-14 DIAGNOSIS — I10 Essential (primary) hypertension: Secondary | ICD-10-CM | POA: Diagnosis not present

## 2014-08-14 DIAGNOSIS — J449 Chronic obstructive pulmonary disease, unspecified: Secondary | ICD-10-CM | POA: Diagnosis not present

## 2014-08-14 DIAGNOSIS — D72829 Elevated white blood cell count, unspecified: Secondary | ICD-10-CM | POA: Diagnosis not present

## 2014-08-14 DIAGNOSIS — G8190 Hemiplegia, unspecified affecting unspecified side: Secondary | ICD-10-CM | POA: Diagnosis not present

## 2014-08-14 DIAGNOSIS — R4182 Altered mental status, unspecified: Secondary | ICD-10-CM | POA: Diagnosis not present

## 2014-08-14 DIAGNOSIS — N182 Chronic kidney disease, stage 2 (mild): Secondary | ICD-10-CM | POA: Diagnosis not present

## 2014-08-14 DIAGNOSIS — I6350 Cerebral infarction due to unspecified occlusion or stenosis of unspecified cerebral artery: Secondary | ICD-10-CM | POA: Diagnosis not present

## 2014-08-14 DIAGNOSIS — D649 Anemia, unspecified: Secondary | ICD-10-CM | POA: Diagnosis not present

## 2014-08-14 DIAGNOSIS — M79602 Pain in left arm: Secondary | ICD-10-CM | POA: Diagnosis not present

## 2014-08-14 DIAGNOSIS — I4891 Unspecified atrial fibrillation: Secondary | ICD-10-CM | POA: Diagnosis not present

## 2014-08-21 DIAGNOSIS — G8190 Hemiplegia, unspecified affecting unspecified side: Secondary | ICD-10-CM | POA: Diagnosis not present

## 2014-08-21 DIAGNOSIS — D649 Anemia, unspecified: Secondary | ICD-10-CM | POA: Diagnosis not present

## 2014-08-21 DIAGNOSIS — J449 Chronic obstructive pulmonary disease, unspecified: Secondary | ICD-10-CM | POA: Diagnosis not present

## 2014-08-21 DIAGNOSIS — R4182 Altered mental status, unspecified: Secondary | ICD-10-CM | POA: Diagnosis not present

## 2014-08-21 DIAGNOSIS — I10 Essential (primary) hypertension: Secondary | ICD-10-CM | POA: Diagnosis not present

## 2014-08-21 DIAGNOSIS — W07XXXA Fall from chair, initial encounter: Secondary | ICD-10-CM | POA: Diagnosis not present

## 2014-08-21 DIAGNOSIS — D72829 Elevated white blood cell count, unspecified: Secondary | ICD-10-CM | POA: Diagnosis not present

## 2014-08-21 DIAGNOSIS — M79602 Pain in left arm: Secondary | ICD-10-CM | POA: Diagnosis not present

## 2014-08-21 DIAGNOSIS — N182 Chronic kidney disease, stage 2 (mild): Secondary | ICD-10-CM | POA: Diagnosis not present

## 2014-08-21 DIAGNOSIS — I6350 Cerebral infarction due to unspecified occlusion or stenosis of unspecified cerebral artery: Secondary | ICD-10-CM | POA: Diagnosis not present

## 2014-08-21 DIAGNOSIS — I4891 Unspecified atrial fibrillation: Secondary | ICD-10-CM | POA: Diagnosis not present

## 2014-08-21 DIAGNOSIS — M25512 Pain in left shoulder: Secondary | ICD-10-CM | POA: Diagnosis not present

## 2014-08-22 ENCOUNTER — Other Ambulatory Visit (HOSPITAL_COMMUNITY): Payer: Self-pay | Admitting: Internal Medicine

## 2014-08-26 DIAGNOSIS — W07XXXA Fall from chair, initial encounter: Secondary | ICD-10-CM | POA: Diagnosis not present

## 2014-08-26 DIAGNOSIS — M25512 Pain in left shoulder: Secondary | ICD-10-CM | POA: Diagnosis not present

## 2014-08-26 DIAGNOSIS — M79642 Pain in left hand: Secondary | ICD-10-CM | POA: Diagnosis not present

## 2014-08-26 DIAGNOSIS — M25522 Pain in left elbow: Secondary | ICD-10-CM | POA: Diagnosis not present

## 2014-08-27 DIAGNOSIS — D72829 Elevated white blood cell count, unspecified: Secondary | ICD-10-CM | POA: Diagnosis not present

## 2014-08-27 DIAGNOSIS — D649 Anemia, unspecified: Secondary | ICD-10-CM | POA: Diagnosis not present

## 2014-08-27 DIAGNOSIS — I6350 Cerebral infarction due to unspecified occlusion or stenosis of unspecified cerebral artery: Secondary | ICD-10-CM | POA: Diagnosis not present

## 2014-08-27 DIAGNOSIS — N182 Chronic kidney disease, stage 2 (mild): Secondary | ICD-10-CM | POA: Diagnosis not present

## 2014-08-27 DIAGNOSIS — W07XXXA Fall from chair, initial encounter: Secondary | ICD-10-CM | POA: Diagnosis not present

## 2014-08-27 DIAGNOSIS — I4891 Unspecified atrial fibrillation: Secondary | ICD-10-CM | POA: Diagnosis not present

## 2014-08-27 DIAGNOSIS — M25512 Pain in left shoulder: Secondary | ICD-10-CM | POA: Diagnosis not present

## 2014-08-27 DIAGNOSIS — R4182 Altered mental status, unspecified: Secondary | ICD-10-CM | POA: Diagnosis not present

## 2014-08-27 DIAGNOSIS — G8190 Hemiplegia, unspecified affecting unspecified side: Secondary | ICD-10-CM | POA: Diagnosis not present

## 2014-08-27 DIAGNOSIS — J449 Chronic obstructive pulmonary disease, unspecified: Secondary | ICD-10-CM | POA: Diagnosis not present

## 2014-08-27 DIAGNOSIS — I10 Essential (primary) hypertension: Secondary | ICD-10-CM | POA: Diagnosis not present

## 2014-08-27 DIAGNOSIS — M79602 Pain in left arm: Secondary | ICD-10-CM | POA: Diagnosis not present

## 2014-08-28 DIAGNOSIS — W07XXXA Fall from chair, initial encounter: Secondary | ICD-10-CM | POA: Diagnosis not present

## 2014-08-28 DIAGNOSIS — D649 Anemia, unspecified: Secondary | ICD-10-CM | POA: Diagnosis not present

## 2014-08-28 DIAGNOSIS — I6350 Cerebral infarction due to unspecified occlusion or stenosis of unspecified cerebral artery: Secondary | ICD-10-CM | POA: Diagnosis not present

## 2014-08-28 DIAGNOSIS — N182 Chronic kidney disease, stage 2 (mild): Secondary | ICD-10-CM | POA: Diagnosis not present

## 2014-08-28 DIAGNOSIS — D72829 Elevated white blood cell count, unspecified: Secondary | ICD-10-CM | POA: Diagnosis not present

## 2014-08-28 DIAGNOSIS — R4182 Altered mental status, unspecified: Secondary | ICD-10-CM | POA: Diagnosis not present

## 2014-08-28 DIAGNOSIS — I4891 Unspecified atrial fibrillation: Secondary | ICD-10-CM | POA: Diagnosis not present

## 2014-08-28 DIAGNOSIS — J449 Chronic obstructive pulmonary disease, unspecified: Secondary | ICD-10-CM | POA: Diagnosis not present

## 2014-08-28 DIAGNOSIS — M25512 Pain in left shoulder: Secondary | ICD-10-CM | POA: Diagnosis not present

## 2014-08-28 DIAGNOSIS — I10 Essential (primary) hypertension: Secondary | ICD-10-CM | POA: Diagnosis not present

## 2014-08-28 DIAGNOSIS — M79602 Pain in left arm: Secondary | ICD-10-CM | POA: Diagnosis not present

## 2014-08-28 DIAGNOSIS — G8190 Hemiplegia, unspecified affecting unspecified side: Secondary | ICD-10-CM | POA: Diagnosis not present

## 2014-08-29 DIAGNOSIS — J189 Pneumonia, unspecified organism: Secondary | ICD-10-CM | POA: Diagnosis not present

## 2014-08-29 DIAGNOSIS — N182 Chronic kidney disease, stage 2 (mild): Secondary | ICD-10-CM | POA: Diagnosis not present

## 2014-08-29 DIAGNOSIS — D72829 Elevated white blood cell count, unspecified: Secondary | ICD-10-CM | POA: Diagnosis not present

## 2014-08-29 DIAGNOSIS — D649 Anemia, unspecified: Secondary | ICD-10-CM | POA: Diagnosis not present

## 2014-08-30 DIAGNOSIS — D649 Anemia, unspecified: Secondary | ICD-10-CM | POA: Diagnosis not present

## 2014-08-30 DIAGNOSIS — D72829 Elevated white blood cell count, unspecified: Secondary | ICD-10-CM | POA: Diagnosis not present

## 2014-08-30 DIAGNOSIS — J189 Pneumonia, unspecified organism: Secondary | ICD-10-CM | POA: Diagnosis not present

## 2014-08-30 DIAGNOSIS — N182 Chronic kidney disease, stage 2 (mild): Secondary | ICD-10-CM | POA: Diagnosis not present

## 2014-09-02 DIAGNOSIS — D649 Anemia, unspecified: Secondary | ICD-10-CM | POA: Diagnosis not present

## 2014-09-02 DIAGNOSIS — J189 Pneumonia, unspecified organism: Secondary | ICD-10-CM | POA: Diagnosis not present

## 2014-09-02 DIAGNOSIS — D72829 Elevated white blood cell count, unspecified: Secondary | ICD-10-CM | POA: Diagnosis not present

## 2014-09-02 DIAGNOSIS — N182 Chronic kidney disease, stage 2 (mild): Secondary | ICD-10-CM | POA: Diagnosis not present

## 2014-09-03 DIAGNOSIS — R197 Diarrhea, unspecified: Secondary | ICD-10-CM | POA: Diagnosis not present

## 2014-09-03 DIAGNOSIS — G8190 Hemiplegia, unspecified affecting unspecified side: Secondary | ICD-10-CM | POA: Diagnosis not present

## 2014-09-03 DIAGNOSIS — J189 Pneumonia, unspecified organism: Secondary | ICD-10-CM | POA: Diagnosis not present

## 2014-09-06 ENCOUNTER — Other Ambulatory Visit (HOSPITAL_COMMUNITY): Payer: Self-pay | Admitting: Internal Medicine

## 2014-09-06 DIAGNOSIS — R1310 Dysphagia, unspecified: Secondary | ICD-10-CM

## 2014-09-11 ENCOUNTER — Ambulatory Visit (HOSPITAL_COMMUNITY)
Admission: RE | Admit: 2014-09-11 | Discharge: 2014-09-11 | Disposition: A | Discharge status: Home / Self Care | Payer: No Typology Code available for payment source | Source: Ambulatory Visit | Attending: Internal Medicine | Admitting: Internal Medicine

## 2014-09-11 ENCOUNTER — Ambulatory Visit (HOSPITAL_COMMUNITY): Payer: No Typology Code available for payment source | Attending: Internal Medicine | Admitting: Speech Pathology

## 2014-09-11 DIAGNOSIS — I69391 Dysphagia following cerebral infarction: Secondary | ICD-10-CM | POA: Diagnosis not present

## 2014-09-11 DIAGNOSIS — R1310 Dysphagia, unspecified: Secondary | ICD-10-CM | POA: Insufficient documentation

## 2014-09-11 DIAGNOSIS — R1314 Dysphagia, pharyngoesophageal phase: Secondary | ICD-10-CM | POA: Diagnosis not present

## 2014-09-11 NOTE — Therapy (Signed)
[facility] [place], [place], [address] Phone: [phone]   Fax:  [phone]  Modified Barium Swallow  Patient Details  Name: Alejandro Diaz MRN: [id number] Date of Birth: [birth year] Referring Provider:  Jani Gravel, MD  Encounter Date: 09/11/2014      End of Session - 09/11/14 1418    Visit Number 1   Number of Visits 1   Authorization Type BCBS Medicare   SLP Start Time 5573   SLP Stop Time  2202   SLP Time Calculation (min) 42 min   Activity Tolerance Patient tolerated treatment well      Past Medical History  Diagnosis Date  . Diabetes mellitus without complication   . Hypertension   . Arthritis   . COPD (chronic obstructive pulmonary disease)     Past Surgical History  Procedure Laterality Date  . Coronary stent placement    . Cardiac surgery    . Brain surgery      There were no vitals filed for this visit.  Visit Diagnosis: Dysphagia, pharyngoesophageal phase      Subjective Assessment - 09/11/14 1349    Subjective Pt seen upright in [place] chair for MBSS   Special Tests MBSS   Currently in Pain? No/denies             General - 09/11/14 1351    General Information   Date of Onset 04/17/14   Other Pertinent Information [name] is a 80 yo male resident at [facility] facility who was referred by Dr. Jani Diaz for MBSS. [name] sustained a stroke 04/17/2014 (right basal ganglia, chronic right temporoparietal infarction). He has been at [facility] for rehab and consuming a mech soft diet with NTL. Pt has PEG, but he states it is only used for some medications.   Type of Study Bedside pediatric feeding/swallowing evaluation  MBSS   Reason for Referral Objectively evaluate swallowing function   Previous Swallow Assessment None on record for Alejandro Diaz, previously seen at [facility] Prior to this Study Dysphagia 3 (soft);Nectar-thick liquids   Temperature Spikes  Noted No   Respiratory Status Room air   History of Recent Intubation No   Behavior/Cognition Alert;Cooperative;Pleasant mood   Oral Cavity - Dentition Edentulous   Oral Motor / Sensory Function Impaired  left sided weakness   Self-Feeding Abilities Able to feed self   Patient Positioning Upright in chair/Tumbleform   Baseline Vocal Quality Breathy   Volitional Cough Strong   Volitional Swallow Able to elicit   Anatomy Within functional limits   Pharyngeal Secretions Not observed secondary MBS            Oral Preparation/Oral Phase - 09/11/14 1412    Oral Preparation/Oral Phase   Oral Phase Impaired   Oral - Solids   Oral - Mechanical Soft Weak ligual manipulation;Piecemeal swallowing;Oral residue;Delayed A-P transit   Oral - Regular Weak ligual manipulation;Piecemeal swallowing;Oral residue;Delayed A-P transit   Electrical stimulation - Oral Phase   Was Electrical Stimulation Used No          Pharyngeal Phase - 09/11/14 1413    Pharyngeal Phase   Pharyngeal Phase Impaired   Pharyngeal - Nectar   Pharyngeal- Nectar Cup Swallow initiation at pyriform sinus;Delayed swallow initiation;Reduced epiglottic inversion;Reduced tongue base retraction;Swallow initiation at vallecula;Pharyngeal residue - valleculae;Lateral channel residue   Pharyngeal - Thin   Pharyngeal- Thin Cup Swallow initiation at pyriform sinus;Delayed swallow initiation;Reduced epiglottic inversion;Reduced  airway/laryngeal closure;Reduced tongue base retraction;Penetration/Aspiration during swallow;Penetration/Apiration after swallow;Trace aspiration;Pharyngeal residue - valleculae;Lateral channel residue;Compensatory strategies attempted (with notebox)  chin tuck   Pharyngeal - Solids   Pharyngeal- Puree Swallow initiation at vallecula;Reduced epiglottic inversion;Reduced tongue base retraction;Pharyngeal residue - valleculae   Pharyngeal- Mechanical Soft Swallow initiation at vallecula;Reduced epiglottic  inversion;Reduced tongue base retraction;Pharyngeal residue - valleculae   Pharyngeal- Multi-consistency --  adequate with m/s and NTL   Pharyngeal- Pill Not tested   Electrical Stimulation - Pharyngeal Phase   Was Electrical Stimulation Used No          Cricopharyngeal Phase - 09/27/14 1416    Cervical Esophageal Phase   Cervical Esophageal Phase Within functional limits  could not see much below UES due to body habitus                  Plan - 09/27/14 1419    Clinical Impression Statement Alejandro Diaz presents with mild/mod oral phase and moderate pharyngeal phase dysphagia characterized by weak lingual manipulation and bolos propulsion for for AP transit, piecemeal deglutition, delay in swallow initiation with swallow trigger after spilling to pyriforms with nectar and thin and filling valleculae with puree and solids, decreased tongue base retraction and epiglottic deflection, decreased laryngeal closure and decreased sensation for residuals and aspiration resulting in delayed swallow initiation, residuals post swallow along tongue (solids) and in pharynx (valleculae and lateral channels), penetration of thins before/during the swallow, and silent aspiration during/after the swallow with thins. Chin tuck was initially effective in preventing aspiration, but not effective  as study went on. Pt benefitted from cue to "swallow hard" with puree and solids and this greatly reduced vallecular residue post swallow. Pt without awareness of aspiration on 3/3 occurrences and required verbal cues to cough (mostly cleared but not all). Recommend D3/mech soft diet with nectar-thick liquids with cues for effortful swallow and pt to swallow 2-3x per bite/sip. Consider trials of thin water after oral care with SLP.   Treatment/Interventions Pharyngeal strengthening exercises;Diet toleration management by SLP   Potential to Achieve Goals Fair          G-Codes - 09-27-14 1428    Functional  Assessment Tool Used MBSS; clinical judgment   Functional Limitations Swallowing   Swallow Current Status (I9518) At least 20 percent but less than 40 percent impaired, limited or restricted   Swallow Goal Status (A4166) At least 1 percent but less than 20 percent impaired, limited or restricted   Swallow Discharge Status [phone]) At least 20 percent but less than 40 percent impaired, limited or restricted          Recommendations/Treatment - 09-27-14 1417    Swallow Evaluation Recommendations   SLP Diet Recommendations Dysphagia 3 (Mech soft);Nectar   Liquid Administration via Cup   Medication Administration Crushed with puree   Supervision Patient able to self feed;Full supervision/cueing for compensatory strategies   Compensations Minimize environmental distractions;Slow rate;Small sips/bites;Check for pocketing;Multiple dry swallows after each bite/sip;Effortful swallow  chin tuck with trials thin   Postural Changes Seated upright at 90 degrees          Prognosis - 09/27/14 1418    Prognosis   Prognosis for Safe Diet Advancement Fair   Barriers to Reach Goals Severity of deficits;Time post onset   Individuals Consulted   Consulted and Agree with Results and Recommendations Patient   Report Sent to  Primary SLP;Facility (Comment);Referring physician      Problem List There are no active problems to display for  this patient.  Thank you,  Genene Churn, Rosita  [facility] 09/11/2014, 2:29 PM  [facility] [address] [place], [place], [address] Phone: [phone]   Fax:  [phone]

## 2014-09-20 DIAGNOSIS — G4700 Insomnia, unspecified: Secondary | ICD-10-CM | POA: Diagnosis not present

## 2014-09-20 DIAGNOSIS — E119 Type 2 diabetes mellitus without complications: Secondary | ICD-10-CM | POA: Diagnosis not present

## 2014-09-20 DIAGNOSIS — Z79899 Other long term (current) drug therapy: Secondary | ICD-10-CM | POA: Diagnosis not present

## 2014-09-20 DIAGNOSIS — I69322 Dysarthria following cerebral infarction: Secondary | ICD-10-CM | POA: Diagnosis not present

## 2014-09-20 DIAGNOSIS — Z7982 Long term (current) use of aspirin: Secondary | ICD-10-CM | POA: Diagnosis not present

## 2014-09-20 DIAGNOSIS — F339 Major depressive disorder, recurrent, unspecified: Secondary | ICD-10-CM | POA: Diagnosis not present

## 2014-09-20 DIAGNOSIS — I10 Essential (primary) hypertension: Secondary | ICD-10-CM | POA: Diagnosis present

## 2014-09-20 DIAGNOSIS — J449 Chronic obstructive pulmonary disease, unspecified: Secondary | ICD-10-CM | POA: Diagnosis present

## 2014-09-20 DIAGNOSIS — R0789 Other chest pain: Secondary | ICD-10-CM | POA: Diagnosis present

## 2014-09-20 DIAGNOSIS — Z87891 Personal history of nicotine dependence: Secondary | ICD-10-CM | POA: Diagnosis not present

## 2014-09-20 DIAGNOSIS — I69354 Hemiplegia and hemiparesis following cerebral infarction affecting left non-dominant side: Secondary | ICD-10-CM | POA: Diagnosis not present

## 2014-09-20 DIAGNOSIS — Z9081 Acquired absence of spleen: Secondary | ICD-10-CM | POA: Diagnosis present

## 2014-09-20 DIAGNOSIS — Z85820 Personal history of malignant melanoma of skin: Secondary | ICD-10-CM | POA: Diagnosis not present

## 2014-09-20 DIAGNOSIS — R1310 Dysphagia, unspecified: Secondary | ICD-10-CM | POA: Diagnosis present

## 2014-09-20 DIAGNOSIS — R262 Difficulty in walking, not elsewhere classified: Secondary | ICD-10-CM | POA: Diagnosis not present

## 2014-09-20 DIAGNOSIS — K219 Gastro-esophageal reflux disease without esophagitis: Secondary | ICD-10-CM | POA: Diagnosis present

## 2014-09-20 DIAGNOSIS — E785 Hyperlipidemia, unspecified: Secondary | ICD-10-CM | POA: Diagnosis not present

## 2014-09-20 DIAGNOSIS — R1013 Epigastric pain: Secondary | ICD-10-CM | POA: Diagnosis present

## 2014-09-20 DIAGNOSIS — I429 Cardiomyopathy, unspecified: Secondary | ICD-10-CM | POA: Diagnosis not present

## 2014-09-20 DIAGNOSIS — R1312 Dysphagia, oropharyngeal phase: Secondary | ICD-10-CM | POA: Diagnosis not present

## 2014-09-20 DIAGNOSIS — I255 Ischemic cardiomyopathy: Secondary | ICD-10-CM | POA: Diagnosis present

## 2014-09-20 DIAGNOSIS — I482 Chronic atrial fibrillation: Secondary | ICD-10-CM | POA: Diagnosis present

## 2014-09-20 DIAGNOSIS — Z7901 Long term (current) use of anticoagulants: Secondary | ICD-10-CM | POA: Diagnosis not present

## 2014-09-20 DIAGNOSIS — I2510 Atherosclerotic heart disease of native coronary artery without angina pectoris: Secondary | ICD-10-CM | POA: Diagnosis present

## 2014-09-20 DIAGNOSIS — Z66 Do not resuscitate: Secondary | ICD-10-CM | POA: Diagnosis present

## 2014-09-20 DIAGNOSIS — Z955 Presence of coronary angioplasty implant and graft: Secondary | ICD-10-CM | POA: Diagnosis not present

## 2014-09-20 DIAGNOSIS — M1990 Unspecified osteoarthritis, unspecified site: Secondary | ICD-10-CM | POA: Diagnosis present

## 2014-09-20 DIAGNOSIS — E11649 Type 2 diabetes mellitus with hypoglycemia without coma: Secondary | ICD-10-CM | POA: Diagnosis not present

## 2014-09-20 DIAGNOSIS — E079 Disorder of thyroid, unspecified: Secondary | ICD-10-CM | POA: Diagnosis not present

## 2014-09-20 DIAGNOSIS — I4891 Unspecified atrial fibrillation: Secondary | ICD-10-CM | POA: Diagnosis not present

## 2014-09-20 DIAGNOSIS — M6281 Muscle weakness (generalized): Secondary | ICD-10-CM | POA: Diagnosis not present

## 2014-09-20 DIAGNOSIS — R2681 Unsteadiness on feet: Secondary | ICD-10-CM | POA: Diagnosis not present

## 2014-09-20 DIAGNOSIS — Z741 Need for assistance with personal care: Secondary | ICD-10-CM | POA: Diagnosis not present

## 2014-09-20 DIAGNOSIS — Z931 Gastrostomy status: Secondary | ICD-10-CM | POA: Diagnosis not present

## 2014-09-20 DIAGNOSIS — I639 Cerebral infarction, unspecified: Secondary | ICD-10-CM | POA: Diagnosis not present

## 2014-09-20 DIAGNOSIS — J188 Other pneumonia, unspecified organism: Secondary | ICD-10-CM | POA: Diagnosis not present

## 2014-09-20 DIAGNOSIS — D72829 Elevated white blood cell count, unspecified: Secondary | ICD-10-CM | POA: Diagnosis not present

## 2014-09-20 DIAGNOSIS — R079 Chest pain, unspecified: Secondary | ICD-10-CM | POA: Diagnosis not present

## 2014-09-20 DIAGNOSIS — G4730 Sleep apnea, unspecified: Secondary | ICD-10-CM | POA: Diagnosis not present

## 2014-09-20 DIAGNOSIS — I214 Non-ST elevation (NSTEMI) myocardial infarction: Secondary | ICD-10-CM | POA: Diagnosis not present

## 2014-09-20 DIAGNOSIS — R278 Other lack of coordination: Secondary | ICD-10-CM | POA: Diagnosis not present

## 2014-09-20 DIAGNOSIS — I82621 Acute embolism and thrombosis of deep veins of right upper extremity: Secondary | ICD-10-CM | POA: Diagnosis not present

## 2014-09-24 ENCOUNTER — Encounter (HOSPITAL_COMMUNITY): Payer: Self-pay | Admitting: *Deleted

## 2014-09-24 ENCOUNTER — Emergency Department (HOSPITAL_COMMUNITY): Payer: Medicare Other

## 2014-09-24 ENCOUNTER — Inpatient Hospital Stay (HOSPITAL_COMMUNITY)
Admission: EM | Admit: 2014-09-24 | Discharge: 2014-09-26 | DRG: 392 | Disposition: A | Discharge status: Skilled Nursing Facility | Payer: Medicare Other | Attending: Internal Medicine | Admitting: Internal Medicine

## 2014-09-24 DIAGNOSIS — Z66 Do not resuscitate: Secondary | ICD-10-CM | POA: Diagnosis present

## 2014-09-24 DIAGNOSIS — I480 Paroxysmal atrial fibrillation: Secondary | ICD-10-CM | POA: Diagnosis not present

## 2014-09-24 DIAGNOSIS — I255 Ischemic cardiomyopathy: Secondary | ICD-10-CM | POA: Diagnosis present

## 2014-09-24 DIAGNOSIS — R1312 Dysphagia, oropharyngeal phase: Secondary | ICD-10-CM | POA: Diagnosis not present

## 2014-09-24 DIAGNOSIS — Z7901 Long term (current) use of anticoagulants: Secondary | ICD-10-CM | POA: Diagnosis not present

## 2014-09-24 DIAGNOSIS — G4700 Insomnia, unspecified: Secondary | ICD-10-CM | POA: Diagnosis not present

## 2014-09-24 DIAGNOSIS — J449 Chronic obstructive pulmonary disease, unspecified: Secondary | ICD-10-CM | POA: Diagnosis present

## 2014-09-24 DIAGNOSIS — K219 Gastro-esophageal reflux disease without esophagitis: Secondary | ICD-10-CM | POA: Diagnosis present

## 2014-09-24 DIAGNOSIS — Z79899 Other long term (current) drug therapy: Secondary | ICD-10-CM

## 2014-09-24 DIAGNOSIS — R1310 Dysphagia, unspecified: Secondary | ICD-10-CM | POA: Diagnosis present

## 2014-09-24 DIAGNOSIS — I214 Non-ST elevation (NSTEMI) myocardial infarction: Secondary | ICD-10-CM

## 2014-09-24 DIAGNOSIS — I639 Cerebral infarction, unspecified: Secondary | ICD-10-CM | POA: Diagnosis not present

## 2014-09-24 DIAGNOSIS — E079 Disorder of thyroid, unspecified: Secondary | ICD-10-CM | POA: Diagnosis not present

## 2014-09-24 DIAGNOSIS — Z7982 Long term (current) use of aspirin: Secondary | ICD-10-CM

## 2014-09-24 DIAGNOSIS — Z87891 Personal history of nicotine dependence: Secondary | ICD-10-CM

## 2014-09-24 DIAGNOSIS — Z955 Presence of coronary angioplasty implant and graft: Secondary | ICD-10-CM

## 2014-09-24 DIAGNOSIS — R278 Other lack of coordination: Secondary | ICD-10-CM | POA: Diagnosis not present

## 2014-09-24 DIAGNOSIS — I69354 Hemiplegia and hemiparesis following cerebral infarction affecting left non-dominant side: Secondary | ICD-10-CM

## 2014-09-24 DIAGNOSIS — I248 Other forms of acute ischemic heart disease: Secondary | ICD-10-CM | POA: Diagnosis not present

## 2014-09-24 DIAGNOSIS — Z85820 Personal history of malignant melanoma of skin: Secondary | ICD-10-CM

## 2014-09-24 DIAGNOSIS — E119 Type 2 diabetes mellitus without complications: Secondary | ICD-10-CM | POA: Diagnosis not present

## 2014-09-24 DIAGNOSIS — I4891 Unspecified atrial fibrillation: Secondary | ICD-10-CM | POA: Diagnosis present

## 2014-09-24 DIAGNOSIS — Z931 Gastrostomy status: Secondary | ICD-10-CM

## 2014-09-24 DIAGNOSIS — I2510 Atherosclerotic heart disease of native coronary artery without angina pectoris: Secondary | ICD-10-CM | POA: Diagnosis present

## 2014-09-24 DIAGNOSIS — L8990 Pressure ulcer of unspecified site, unspecified stage: Secondary | ICD-10-CM | POA: Diagnosis not present

## 2014-09-24 DIAGNOSIS — Z741 Need for assistance with personal care: Secondary | ICD-10-CM | POA: Diagnosis not present

## 2014-09-24 DIAGNOSIS — R1013 Epigastric pain: Principal | ICD-10-CM | POA: Diagnosis present

## 2014-09-24 DIAGNOSIS — I481 Persistent atrial fibrillation: Secondary | ICD-10-CM | POA: Diagnosis not present

## 2014-09-24 DIAGNOSIS — M1990 Unspecified osteoarthritis, unspecified site: Secondary | ICD-10-CM | POA: Diagnosis present

## 2014-09-24 DIAGNOSIS — I82621 Acute embolism and thrombosis of deep veins of right upper extremity: Secondary | ICD-10-CM | POA: Diagnosis not present

## 2014-09-24 DIAGNOSIS — I69322 Dysarthria following cerebral infarction: Secondary | ICD-10-CM | POA: Diagnosis not present

## 2014-09-24 DIAGNOSIS — E785 Hyperlipidemia, unspecified: Secondary | ICD-10-CM | POA: Diagnosis not present

## 2014-09-24 DIAGNOSIS — M6281 Muscle weakness (generalized): Secondary | ICD-10-CM | POA: Diagnosis not present

## 2014-09-24 DIAGNOSIS — I10 Essential (primary) hypertension: Secondary | ICD-10-CM | POA: Diagnosis present

## 2014-09-24 DIAGNOSIS — E11649 Type 2 diabetes mellitus with hypoglycemia without coma: Secondary | ICD-10-CM | POA: Diagnosis not present

## 2014-09-24 DIAGNOSIS — R0789 Other chest pain: Secondary | ICD-10-CM | POA: Diagnosis present

## 2014-09-24 DIAGNOSIS — R2681 Unsteadiness on feet: Secondary | ICD-10-CM | POA: Diagnosis not present

## 2014-09-24 DIAGNOSIS — Z9081 Acquired absence of spleen: Secondary | ICD-10-CM | POA: Diagnosis present

## 2014-09-24 DIAGNOSIS — R262 Difficulty in walking, not elsewhere classified: Secondary | ICD-10-CM | POA: Diagnosis not present

## 2014-09-24 DIAGNOSIS — R079 Chest pain, unspecified: Secondary | ICD-10-CM | POA: Diagnosis present

## 2014-09-24 DIAGNOSIS — I482 Chronic atrial fibrillation: Secondary | ICD-10-CM | POA: Diagnosis present

## 2014-09-24 DIAGNOSIS — E0840 Diabetes mellitus due to underlying condition with diabetic neuropathy, unspecified: Secondary | ICD-10-CM | POA: Diagnosis not present

## 2014-09-24 DIAGNOSIS — J188 Other pneumonia, unspecified organism: Secondary | ICD-10-CM | POA: Diagnosis not present

## 2014-09-24 DIAGNOSIS — I429 Cardiomyopathy, unspecified: Secondary | ICD-10-CM | POA: Diagnosis not present

## 2014-09-24 DIAGNOSIS — I959 Hypotension, unspecified: Secondary | ICD-10-CM | POA: Diagnosis not present

## 2014-09-24 DIAGNOSIS — D72829 Elevated white blood cell count, unspecified: Secondary | ICD-10-CM | POA: Diagnosis not present

## 2014-09-24 HISTORY — DX: Ischemic cardiomyopathy: I25.5

## 2014-09-24 HISTORY — DX: Gastro-esophageal reflux disease without esophagitis: K21.9

## 2014-09-24 HISTORY — DX: Atherosclerotic heart disease of native coronary artery without angina pectoris: I25.10

## 2014-09-24 HISTORY — DX: Cerebral infarction, unspecified: I63.9

## 2014-09-24 HISTORY — DX: Malignant (primary) neoplasm, unspecified: C80.1

## 2014-09-24 HISTORY — DX: Unspecified atrial fibrillation: I48.91

## 2014-09-24 LAB — COMPREHENSIVE METABOLIC PANEL
ALT: 11 U/L — ABNORMAL LOW (ref 17–63)
AST: 27 U/L (ref 15–41)
Albumin: 2.8 g/dL — ABNORMAL LOW (ref 3.5–5.0)
Alkaline Phosphatase: 60 U/L (ref 38–126)
Anion gap: 8 (ref 5–15)
BUN: 12 mg/dL (ref 6–20)
CHLORIDE: 105 mmol/L (ref 101–111)
CO2: 27 mmol/L (ref 22–32)
Calcium: 8.9 mg/dL (ref 8.9–10.3)
Creatinine, Ser: 1.61 mg/dL — ABNORMAL HIGH (ref 0.61–1.24)
GFR, EST AFRICAN AMERICAN: 46 mL/min — AB (ref >60)
GFR, EST NON AFRICAN AMERICAN: 39 mL/min — AB (ref >60)
Glucose, Bld: 100 mg/dL — ABNORMAL HIGH (ref 65–99)
Potassium: 3.5 mmol/L (ref 3.5–5.1)
Sodium: 140 mmol/L (ref 135–145)
Total Bilirubin: 0.9 mg/dL (ref 0.3–1.2)
Total Protein: 5.9 g/dL — ABNORMAL LOW (ref 6.5–8.1)

## 2014-09-24 LAB — CBC WITH DIFFERENTIAL/PLATELET
BASOS PCT: 1 % (ref 0–1)
Basophils Absolute: 0 10*3/uL (ref 0.0–0.1)
Eosinophils Absolute: 0.5 10*3/uL (ref 0.0–0.7)
Eosinophils Relative: 7 % — ABNORMAL HIGH (ref 0–5)
HCT: 43.7 % (ref 39.0–52.0)
Hemoglobin: 14 g/dL (ref 13.0–17.0)
Lymphocytes Relative: 30 % (ref 12–46)
Lymphs Abs: 2 10*3/uL (ref 0.7–4.0)
MCH: 31.4 pg (ref 26.0–34.0)
MCHC: 32 g/dL (ref 30.0–36.0)
MCV: 98 fL (ref 78.0–100.0)
Monocytes Absolute: 1 10*3/uL (ref 0.1–1.0)
Monocytes Relative: 16 % — ABNORMAL HIGH (ref 3–12)
Neutro Abs: 3.1 10*3/uL (ref 1.7–7.7)
Neutrophils Relative %: 46 % (ref 43–77)
PLATELETS: 259 10*3/uL (ref 150–400)
RBC: 4.46 MIL/uL (ref 4.22–5.81)
RDW: 15.5 % (ref 11.5–15.5)
WBC: 6.6 10*3/uL (ref 4.0–10.5)

## 2014-09-24 LAB — GLUCOSE, CAPILLARY
GLUCOSE-CAPILLARY: 109 mg/dL — AB (ref 65–99)
GLUCOSE-CAPILLARY: 84 mg/dL (ref 65–99)
Glucose-Capillary: 67 mg/dL (ref 65–99)

## 2014-09-24 LAB — TROPONIN I
TROPONIN I: 0.05 ng/mL — AB (ref <0.031)
Troponin I: 0.04 ng/mL — ABNORMAL HIGH (ref <0.031)

## 2014-09-24 LAB — MRSA PCR SCREENING: MRSA BY PCR: POSITIVE — AB

## 2014-09-24 MED ORDER — RANITIDINE HCL 150 MG/10ML PO SYRP
300.0000 mg | ORAL_SOLUTION | Freq: Every day | ORAL | Status: DC
  Administered 2014-09-25 – 2014-09-26 (×2): 300 mg
  Filled 2014-09-24 (×5): qty 20

## 2014-09-24 MED ORDER — SODIUM CHLORIDE 0.9 % IV BOLUS (SEPSIS)
500.0000 mL | Freq: Once | INTRAVENOUS | Status: AC
  Administered 2014-09-24: 500 mL via INTRAVENOUS

## 2014-09-24 MED ORDER — INSULIN ASPART 100 UNIT/ML ~~LOC~~ SOLN
0.0000 [IU] | Freq: Every day | SUBCUTANEOUS | Status: DC
Start: 2014-09-24 — End: 2014-09-26

## 2014-09-24 MED ORDER — PROMETHAZINE HCL 6.25 MG/5ML PO SYRP
12.5000 mg | ORAL_SOLUTION | Freq: Four times a day (QID) | ORAL | Status: DC | PRN
  Filled 2014-09-24: qty 10

## 2014-09-24 MED ORDER — CETYLPYRIDINIUM CHLORIDE 0.05 % MT LIQD
7.0000 mL | Freq: Two times a day (BID) | OROMUCOSAL | Status: DC
  Administered 2014-09-24 – 2014-09-26 (×4): 7 mL via OROMUCOSAL

## 2014-09-24 MED ORDER — KETOROLAC TROMETHAMINE 30 MG/ML IJ SOLN
INTRAMUSCULAR | Status: AC
  Filled 2014-09-24: qty 1

## 2014-09-24 MED ORDER — VITAMIN D 1000 UNITS PO TABS
1000.0000 [IU] | ORAL_TABLET | Freq: Every day | ORAL | Status: DC
  Administered 2014-09-25 – 2014-09-26 (×2): 1000 [IU] via ORAL
  Filled 2014-09-24 (×3): qty 1

## 2014-09-24 MED ORDER — FERROUS SULFATE 300 (60 FE) MG/5ML PO SYRP
220.0000 mg | ORAL_SOLUTION | Freq: Every day | ORAL | Status: DC
  Administered 2014-09-25 – 2014-09-26 (×2): 220 mg
  Filled 2014-09-24 (×5): qty 5

## 2014-09-24 MED ORDER — TRAZODONE HCL 50 MG PO TABS: 100.0000 mg | ORAL_TABLET | Freq: Every evening | ORAL | Status: DC | PRN

## 2014-09-24 MED ORDER — INSULIN ASPART 100 UNIT/ML ~~LOC~~ SOLN
0.0000 [IU] | Freq: Three times a day (TID) | SUBCUTANEOUS | Status: DC
Start: 2014-09-25 — End: 2014-09-26
  Administered 2014-09-25 – 2014-09-26 (×2): 2 [IU] via SUBCUTANEOUS

## 2014-09-24 MED ORDER — MELATONIN 3 MG PO TABS: 3.0000 mg | ORAL_TABLET | Freq: Every day | ORAL | Status: DC

## 2014-09-24 MED ORDER — TIOTROPIUM BROMIDE MONOHYDRATE 18 MCG IN CAPS
18.0000 ug | ORAL_CAPSULE | Freq: Every day | RESPIRATORY_TRACT | Status: DC
  Administered 2014-09-26: 18 ug via RESPIRATORY_TRACT
  Filled 2014-09-24: qty 5

## 2014-09-24 MED ORDER — HYDROCODONE-ACETAMINOPHEN 5-325 MG PO TABS: 1.0000 | ORAL_TABLET | Freq: Four times a day (QID) | ORAL | Status: DC | PRN

## 2014-09-24 MED ORDER — DOFETILIDE 500 MCG PO CAPS
500.0000 ug | ORAL_CAPSULE | Freq: Two times a day (BID) | ORAL | Status: DC
  Administered 2014-09-24 – 2014-09-26 (×4): 500 ug
  Filled 2014-09-24 (×7): qty 1

## 2014-09-24 MED ORDER — ONDANSETRON HCL 4 MG/2ML IJ SOLN: 4.0000 mg | Freq: Four times a day (QID) | INTRAMUSCULAR | Status: DC | PRN

## 2014-09-24 MED ORDER — ENSURE ENLIVE PO LIQD
120.0000 mL | Freq: Three times a day (TID) | ORAL | Status: DC
  Administered 2014-09-24 – 2014-09-26 (×6): 120 mL via ORAL

## 2014-09-24 MED ORDER — IPRATROPIUM-ALBUTEROL 0.5-2.5 (3) MG/3ML IN SOLN
3.0000 mL | Freq: Four times a day (QID) | RESPIRATORY_TRACT | Status: DC | PRN
  Administered 2014-09-24: 3 mL via RESPIRATORY_TRACT
  Filled 2014-09-24: qty 3

## 2014-09-24 MED ORDER — DEXTROSE 50 % IV SOLN
INTRAVENOUS | Status: AC
Start: 2014-09-24 — End: 2014-09-24
  Administered 2014-09-24: 50 mL
  Filled 2014-09-24: qty 50

## 2014-09-24 MED ORDER — FAMOTIDINE 20 MG PO TABS
20.0000 mg | ORAL_TABLET | Freq: Two times a day (BID) | ORAL | Status: DC
  Administered 2014-09-24 – 2014-09-26 (×4): 20 mg
  Filled 2014-09-24 (×4): qty 1

## 2014-09-24 MED ORDER — PAROXETINE HCL 20 MG PO TABS
40.0000 mg | ORAL_TABLET | Freq: Every day | ORAL | Status: DC
  Administered 2014-09-25 – 2014-09-26 (×2): 40 mg
  Filled 2014-09-24 (×3): qty 2

## 2014-09-24 MED ORDER — GUAIFENESIN 100 MG/5ML PO SOLN
600.0000 mg | Freq: Two times a day (BID) | ORAL | Status: DC
  Administered 2014-09-24 – 2014-09-26 (×4): 600 mg
  Filled 2014-09-24 (×2): qty 30
  Filled 2014-09-24: qty 5
  Filled 2014-09-24 (×7): qty 30

## 2014-09-24 MED ORDER — ALBUTEROL SULFATE (2.5 MG/3ML) 0.083% IN NEBU
2.5000 mg | INHALATION_SOLUTION | Freq: Two times a day (BID) | RESPIRATORY_TRACT | Status: DC
  Administered 2014-09-25 – 2014-09-26 (×3): 2.5 mg via RESPIRATORY_TRACT
  Filled 2014-09-24 (×3): qty 3

## 2014-09-24 MED ORDER — PROMETHAZINE HCL 12.5 MG PO TABS: 12.5000 mg | ORAL_TABLET | Freq: Four times a day (QID) | ORAL | Status: DC | PRN

## 2014-09-24 MED ORDER — GLIPIZIDE 5 MG PO TABS
5.0000 mg | ORAL_TABLET | Freq: Every day | ORAL | Status: DC
  Administered 2014-09-25 – 2014-09-26 (×2): 5 mg
  Filled 2014-09-24 (×2): qty 1

## 2014-09-24 MED ORDER — NITROGLYCERIN 0.4 MG SL SUBL: 0.4000 mg | SUBLINGUAL_TABLET | SUBLINGUAL | Status: DC | PRN

## 2014-09-24 MED ORDER — ASPIRIN EC 81 MG PO TBEC
81.0000 mg | DELAYED_RELEASE_TABLET | Freq: Every day | ORAL | Status: DC
  Administered 2014-09-25 – 2014-09-26 (×2): 81 mg via ORAL
  Filled 2014-09-24 (×3): qty 1

## 2014-09-24 MED ORDER — BACITRACIN ZINC 500 UNIT/GM EX OINT
TOPICAL_OINTMENT | Freq: Three times a day (TID) | CUTANEOUS | Status: DC | PRN
  Administered 2014-09-24: 1 via TOPICAL
  Filled 2014-09-24: qty 0.9

## 2014-09-24 MED ORDER — PRAVASTATIN SODIUM 40 MG PO TABS
40.0000 mg | ORAL_TABLET | Freq: Every day | ORAL | Status: DC
  Administered 2014-09-24 – 2014-09-25 (×2): 40 mg
  Filled 2014-09-24 (×2): qty 1

## 2014-09-24 MED ORDER — POLYETHYLENE GLYCOL 3350 17 G PO PACK
17.0000 g | PACK | Freq: Every day | ORAL | Status: DC
  Administered 2014-09-25 – 2014-09-26 (×2): 17 g
  Filled 2014-09-24 (×3): qty 1

## 2014-09-24 MED ORDER — METOPROLOL TARTRATE 50 MG PO TABS
100.0000 mg | ORAL_TABLET | Freq: Two times a day (BID) | ORAL | Status: DC
  Administered 2014-09-24 – 2014-09-26 (×4): 100 mg
  Filled 2014-09-24 (×4): qty 2

## 2014-09-24 MED ORDER — BUDESONIDE-FORMOTEROL FUMARATE 160-4.5 MCG/ACT IN AERO
INHALATION_SPRAY | RESPIRATORY_TRACT | Status: AC
  Filled 2014-09-24: qty 6

## 2014-09-24 MED ORDER — SODIUM CHLORIDE 0.9 % IV SOLN
INTRAVENOUS | Status: DC
  Administered 2014-09-24 – 2014-09-26 (×5): via INTRAVENOUS

## 2014-09-24 MED ORDER — BUDESONIDE-FORMOTEROL FUMARATE 160-4.5 MCG/ACT IN AERO
2.0000 | INHALATION_SPRAY | Freq: Two times a day (BID) | RESPIRATORY_TRACT | Status: DC
  Administered 2014-09-24 – 2014-09-26 (×4): 2 via RESPIRATORY_TRACT
  Filled 2014-09-24: qty 6

## 2014-09-24 MED ORDER — KETOROLAC TROMETHAMINE 30 MG/ML IJ SOLN: 15.0000 mg | Freq: Once | INTRAMUSCULAR | Status: DC

## 2014-09-24 MED ORDER — APIXABAN 5 MG PO TABS
5.0000 mg | ORAL_TABLET | Freq: Two times a day (BID) | ORAL | Status: DC
  Administered 2014-09-24 – 2014-09-26 (×4): 5 mg
  Filled 2014-09-24 (×4): qty 1

## 2014-09-24 MED ORDER — ISOSORBIDE DINITRATE 20 MG PO TABS
30.0000 mg | ORAL_TABLET | Freq: Three times a day (TID) | ORAL | Status: DC
  Administered 2014-09-24 – 2014-09-26 (×5): 30 mg
  Filled 2014-09-24 (×5): qty 2

## 2014-09-24 MED ORDER — SODIUM CHLORIDE 0.9 % IJ SOLN: 3.0000 mL | Freq: Two times a day (BID) | INTRAMUSCULAR | Status: DC

## 2014-09-24 MED ORDER — LEVOTHYROXINE SODIUM 25 MCG PO TABS
125.0000 ug | ORAL_TABLET | Freq: Every day | ORAL | Status: DC
  Administered 2014-09-25 – 2014-09-26 (×2): 125 ug
  Filled 2014-09-24 (×4): qty 1

## 2014-09-24 MED ORDER — SENNOSIDES-DOCUSATE SODIUM 8.6-50 MG PO TABS: 1.0000 | ORAL_TABLET | Freq: Every day | ORAL | Status: DC | PRN

## 2014-09-24 MED ORDER — ONDANSETRON HCL 4 MG PO TABS: 4.0000 mg | ORAL_TABLET | Freq: Four times a day (QID) | ORAL | Status: DC | PRN

## 2014-09-24 NOTE — Progress Notes (Signed)
Hypoglycemic Event  CBG: 67  Treatment: D50 IV 25 mL  Symptoms: None  Follow-up CBG: Time:1841 CBG Result: 109  Possible Reasons for Event: Patient has not had any intake all day due to peg tube and not receiving his daily feedings.   Comments/MD notified: Yes. Dr. Bronson Curb, Venita Sheffield  Remember to initiate Hypoglycemia Order Set & complete

## 2014-09-24 NOTE — ED Notes (Signed)
Pt comes in from [facility] with chest pain starting today.  Per patient chest pain comes and goes. BP per EMS is 90/60. IV in place by [facility]. NAD noted.   Pt has Pink MOST form at bedside.

## 2014-09-24 NOTE — H&P (Signed)
[facility] History and Physical  [facility] [place] ASN:[id number] DOB: 1935/02/22 DOA: 09/24/2014  Referring physician: ER PCP: Jani Gravel, MD   Chief Complaint: Chest pain  HPI: Alejandro Diaz is a 80 y.o. 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 He presents today with lower chest/epigastric pain which came on at approximately noon today after he had been given PEG tube feeding. It was associated with dyspnea and some nausea but there was no vomiting. He was sweating to some degree but not significantly so. The pain seemed to radiate to his back. It apparently lasted approximately an hour and seems to have gone now since being in the emergency room. On arrival to the emergency room, he was found to be somewhat hypotensive and had a tachycardia with his atrial fibrillation. He was given IV fluids and this has improved. There were no signs of any bleeding via the PEG tube. He is diabetic. He is now being admitted for further investigation and management.   Review of Systems:  Apart from symptoms above, all systems negative.   Past Medical History  Diagnosis Date  . Diabetes mellitus without complication   . Hypertension   . Arthritis   . COPD (chronic obstructive pulmonary disease)   . Cancer     melanoma  . Atrial fibrillation   . GERD (gastroesophageal reflux disease)    Past Surgical History  Procedure Laterality Date  . Coronary stent placement    . Cardiac surgery    . Brain surgery    . Back surgery    . Splenectomy     Social History:  reports that he has quit smoking. He has never used smokeless tobacco. He reports that he does not drink alcohol or use illicit drugs.  No Known Allergies   Family history: Both his parents had heart disease.  Prior to Admission medications   Medication Sig Start Date End Date  Taking? Authorizing Provider  apixaban (ELIQUIS) 5 MG TABS tablet 5 mg by PEG Tube route 2 (two) times daily.   Yes Historical Provider, MD  aspirin EC 81 MG tablet 81 mg by PEG Tube route daily.   Yes Historical Provider, MD  budesonide-formoterol (SYMBICORT) 160-4.5 MCG/ACT inhaler Inhale 2 puffs into the lungs 2 (two) times daily.   Yes Historical Provider, MD  cholecalciferol (VITAMIN D) 1000 UNITS tablet 1,000 Units by PEG Tube route daily.    Yes Historical Provider, MD  dofetilide (TIKOSYN) 500 MCG capsule 500 mcg by PEG Tube route every 12 (twelve) hours.    Yes Historical Provider, MD  famotidine (PEPCID) 20 MG tablet 20 mg by PEG Tube route 2 (two) times daily.   Yes Historical Provider, MD  ferrous sulfate 220 (44 FE) MG/5ML solution Place 220 mg into feeding tube daily.   Yes Historical Provider, MD  glipiZIDE (GLUCOTROL) 5 MG tablet 5 mg by PEG Tube route daily before breakfast.   Yes Historical Provider, MD  guaifenesin (ROBITUSSIN) 100 MG/5ML syrup 600 mg by PEG Tube route 2 (two) times daily.   Yes Historical Provider, MD  HYDROcodone-acetaminophen (NORCO/VICODIN) 5-325 MG per tablet 1 tablet by PEG Tube route every 6 (six) hours as needed for moderate pain.   Yes Historical Provider, MD  ipratropium-albuterol (DUONEB) 0.5-2.5 (3) MG/3ML SOLN Take 3 mLs by nebulization every 6 (six)  hours as needed (shortness of breath/wheezing).   Yes Historical Provider, MD  isosorbide dinitrate (ISORDIL) 30 MG tablet 30 mg by PEG Tube route 3 (three) times daily.   Yes Historical Provider, MD  levothyroxine (SYNTHROID, LEVOTHROID) 125 MCG tablet 125 mcg by PEG Tube route daily before breakfast.   Yes Historical Provider, MD  Melatonin 3 MG TABS Take 3 mg by mouth at bedtime.   Yes Historical Provider, MD  metoprolol (LOPRESSOR) 100 MG tablet 100 mg by PEG Tube route 2 (two) times daily.   Yes Historical Provider, MD  nitroGLYCERIN (NITROSTAT) 0.4 MG SL tablet Place 0.4 mg under the tongue every 5  (five) minutes as needed for chest pain.   Yes Historical Provider, MD  Nutritional Supplements (FEEDING SUPPLEMENT, JEVITY 1.5 CAL,) LIQD Place 120 mLs into feeding tube 3 (three) times daily.   Yes Historical Provider, MD  paroxetine (PAXIL) 10 MG/5ML suspension 40 mg by PEG Tube route daily.   Yes Historical Provider, MD  polyethylene glycol (MIRALAX / GLYCOLAX) packet 17 g by PEG Tube route daily.   Yes Historical Provider, MD  pravastatin (PRAVACHOL) 40 MG tablet 40 mg by PEG Tube route at bedtime.   Yes Historical Provider, MD  promethazine (PHENERGAN) 6.25 MG/5ML syrup 12.5 mg by PEG Tube route every 6 (six) hours as needed for nausea or vomiting.   Yes Historical Provider, MD  ranitidine (ZANTAC) 150 MG/10ML syrup Place 300 mg into feeding tube daily.   Yes Historical Provider, MD  tiotropium (SPIRIVA) 18 MCG inhalation capsule Place 18 mcg into inhaler and inhale daily.   Yes Historical Provider, MD  traZODone (DESYREL) 100 MG tablet 100 mg by PEG Tube route at bedtime as needed for sleep.   Yes Historical Provider, MD  albuterol (PROVENTIL HFA;VENTOLIN HFA) 108 (90 BASE) MCG/ACT inhaler Inhale 1-2 puffs into the lungs every 6 (six) hours as needed for wheezing or shortness of breath. Patient not taking: Reported on 09/24/2014 06/16/13   Carmin Muskrat, MD  sennosides-docusate sodium (SENOKOT-S) 8.6-50 MG tablet 1 tablet by PEG Tube route daily as needed for constipation.     Historical Provider, MD   Physical Exam: Filed Vitals:   09/24/14 1530 09/24/14 1545 09/24/14 1600 09/24/14 1706  BP: 112/62 88/74 108/70 112/75  Pulse: 141 89 169 103  Temp:    98 F (36.7 C)  TempSrc:    Oral  Resp: 20 0 20   Height:    5\' 11"  (1.803 m)  Weight:    93.622 kg (206 lb 6.4 oz)  SpO2: 96% 97% 100% 98%    Wt Readings from Last 3 Encounters:  09/24/14 93.622 kg (206 lb 6.4 oz)    General:  Appears clinically dehydrated. Eyes: PERRL, normal lids, irises & conjunctiva ENT: grossly normal  hearing, lips & tongue Neck: no LAD, masses or thyromegaly Cardiovascular: Irregularly irregular, consistent with atrial fibrillation. He appears to be hemodynamically stable at the present time. There are no clinical signs of heart failure. Telemetry: Atrial fibrillation. Respiratory: CTA bilaterally, no w/r/r. Normal respiratory effort. Abdomen: soft, ntnd Skin: no rash or induration seen on limited exam Musculoskeletal: grossly normal tone BUE/BLE Psychiatric: grossly normal mood and affect, speech fluent and appropriate Neurologic: grossly non-focal.          Labs on Admission:  Basic Metabolic Panel:  Recent Labs Lab 09/24/14 1333  NA 140  K 3.5  CL 105  CO2 27  GLUCOSE 100*  BUN 12  CREATININE 1.61*  CALCIUM 8.9  Liver Function Tests:  Recent Labs Lab 09/24/14 1333  AST 27  ALT 11*  ALKPHOS 60  BILITOT 0.9  PROT 5.9*  ALBUMIN 2.8*   No results for input(s): LIPASE, AMYLASE in the last 168 hours. No results for input(s): AMMONIA in the last 168 hours. CBC:  Recent Labs Lab 09/24/14 1333  WBC 6.6  NEUTROABS 3.1  HGB 14.0  HCT 43.7  MCV 98.0  PLT 259   Cardiac Enzymes:  Recent Labs Lab 09/24/14 1333  TROPONINI 0.04*    BNP (last 3 results) No results for input(s): BNP in the last 8760 hours.  ProBNP (last 3 results) No results for input(s): PROBNP in the last 8760 hours.  CBG: No results for input(s): GLUCAP in the last 168 hours.  Radiological Exams on Admission: Dg Chest Portable 1 View  09/24/2014   CLINICAL DATA:  Chest pain, history of COPD, atrial fibrillation, and diabetes ; history of coronary stent placement  EXAM: PORTABLE CHEST - 1 VIEW  COMPARISON:  Portable chest x-ray dated June 18, 2014  FINDINGS: The lungs are adequately inflated. There is no focal infiltrate. There is minimal subsegmental atelectasis or scarring at the right lung base. The heart is top-normal in size. The pulmonary vascularity is not engorged. The  mediastinum is normal in width. No significant pleural effusion is observed. The bony thorax exhibits no acute abnormality.  IMPRESSION: COPD with minimal right basilar atelectasis or scarring. There is no CHF nor pneumonia.   Electronically Signed   By: David  Martinique M.D.   On: 09/24/2014 15:06    EKG: Independently reviewed. Atrial fibrillation. No acute ST-T wave changes.  Assessment/Plan   1. Chest pain. Although his initial troponin is slightly elevated, his history is not typical for cardiac pain. The pain came on soon after he was fed via the PEG tube. However, he does have risk factors for coronary artery disease and we will cycle cardiac enzymes. Cardiology consultation in the morning. The other consideration is that this pain is gastric in origin. Consider gastroenterology evaluation if pain recurs/persists and troponin levels are not impressive. 2. Hypotension. This may reflect hypovolemia, especially in view of his elevated creatinine. IV fluids is appropriate. Monitor renal function closely. 3. Atrial fibrillation. His ventricular rate is acceptable and does not require further reduction. Continue with chronic anticoagulation therapy. 4. Hypertension. Continue with home medications. 5. Diabetes. Continue with home medications and sliding scale of insulin.  Further recommendations will depend on patient's hospital progress.   Code Status: DO NOT RESUSCITATE. This was confirmed by his daughter at the bedside.  DVT Prophylaxis: Chronic anticoagulation.  Family Communication: I discussed the plan with the patient's family at the bedside.   Disposition Plan: Back to the skilled nursing facility when medically stable.   Time spent: 60 minutes.  Doree Albee [facility] Pager [phone].

## 2014-09-24 NOTE — ED Provider Notes (Addendum)
CSN: [id number]     Arrival date & time 09/24/14  1256 History   First MD Initiated Contact with Patient 09/24/14 1257     Chief Complaint  Patient presents with  . Chest Pain     (Consider location/radiation/quality/duration/timing/severity/associated sxs/prior Treatment) Patient is a 80 y.o. male presenting with chest pain. The history is provided by the patient (the pt has had some chest pain today).  Chest Pain Pain location:  Substernal area Pain quality: aching   Pain radiates to:  Does not radiate Pain radiates to the back: yes   Pain severity:  Moderate Onset quality:  Sudden Timing:  Intermittent Chronicity:  Recurrent Context: not breathing   Associated symptoms: no abdominal pain, no back pain, no cough, no fatigue and no headache     Past Medical History  Diagnosis Date  . Diabetes mellitus without complication   . Hypertension   . Arthritis   . COPD (chronic obstructive pulmonary disease)   . Cancer     melanoma  . Atrial fibrillation   . GERD (gastroesophageal reflux disease)    Past Surgical History  Procedure Laterality Date  . Coronary stent placement    . Cardiac surgery    . Brain surgery    . Back surgery    . Splenectomy     No family history on file. History  Substance Use Topics  . Smoking status: Former Research scientist (life sciences)  . Smokeless tobacco: Never Used  . Alcohol Use: No    Review of Systems  Constitutional: Negative for appetite change and fatigue.  HENT: Negative for congestion, ear discharge and sinus pressure.   Eyes: Negative for discharge.  Respiratory: Negative for cough.   Cardiovascular: Positive for chest pain.  Gastrointestinal: Negative for abdominal pain and diarrhea.  Genitourinary: Negative for frequency and hematuria.  Musculoskeletal: Negative for back pain.  Skin: Negative for rash.  Neurological: Negative for seizures and headaches.  Psychiatric/Behavioral: Negative for hallucinations.      Allergies  Review of  patient's allergies indicates no known allergies.  Home Medications   Prior to Admission medications   Medication Sig Start Date End Date Taking? Authorizing Provider  albuterol (PROVENTIL HFA;VENTOLIN HFA) 108 (90 BASE) MCG/ACT inhaler Inhale 1-2 puffs into the lungs every 6 (six) hours as needed for wheezing or shortness of breath. 06/16/13   Carmin Muskrat, MD  cholecalciferol (VITAMIN D) 1000 UNITS tablet Take 1,000 Units by mouth daily.    Historical Provider, MD  dofetilide (TIKOSYN) 500 MCG capsule Take 500 mcg by mouth every 12 (twelve) hours.    Historical Provider, MD  Ferrous Sulfate 27 MG TABS Take 1 tablet by mouth daily.    Historical Provider, MD  furosemide (LASIX) 40 MG tablet Take 40 mg by mouth daily as needed for fluid.    Historical Provider, MD  glipiZIDE (GLUCOTROL XL) 5 MG 24 hr tablet Take 5 mg by mouth daily with breakfast.    Historical Provider, MD  isosorbide mononitrate (IMDUR) 60 MG 24 hr tablet Take 60 mg by mouth daily.    Historical Provider, MD  levothyroxine (SYNTHROID, LEVOTHROID) 137 MCG tablet Take 137 mcg by mouth daily before breakfast.    Historical Provider, MD  losartan (COZAAR) 25 MG tablet Take 25 mg by mouth daily.    Historical Provider, MD  magnesium oxide (MAG-OX) 400 MG tablet Take 400 mg by mouth daily.    Historical Provider, MD  metFORMIN (GLUCOPHAGE) 500 MG tablet Take 500 mg by mouth 2 (two) times  daily with a meal.    Historical Provider, MD  metoprolol succinate (TOPROL-XL) 100 MG 24 hr tablet Take 100 mg by mouth daily. Take with or immediately following a meal.    Historical Provider, MD  Omega-3 Fatty Acids (FISH OIL PO) Take 1 capsule by mouth daily.    Historical Provider, MD  omeprazole (PRILOSEC) 20 MG capsule Take 20 mg by mouth 2 (two) times daily before a meal.    Historical Provider, MD  sennosides-docusate sodium (SENOKOT-S) 8.6-50 MG tablet Take 1 tablet by mouth daily as needed for constipation.    Historical Provider, MD   simvastatin (ZOCOR) 20 MG tablet Take 20 mg by mouth daily.    Historical Provider, MD   BP 107/59 mmHg  Pulse 95  Temp(Src) 97.7 F (36.5 C) (Oral)  Resp 21  SpO2 100% Physical Exam  Constitutional: He is oriented to person, place, and time. He appears well-developed.  HENT:  Head: Normocephalic.  Eyes: Conjunctivae and EOM are normal. No scleral icterus.  Neck: Neck supple. No thyromegaly present.  Cardiovascular: Normal rate and regular rhythm.  Exam reveals no gallop and no friction rub.   No murmur heard. Pulmonary/Chest: No stridor. He has no wheezes. He has no rales. He exhibits no tenderness.  Abdominal: He exhibits no distension. There is no tenderness. There is no rebound.  Musculoskeletal:  Pt unable to move left arm and leg from old stroke  Lymphadenopathy:    He has no cervical adenopathy.  Neurological: He is oriented to person, place, and time. He exhibits normal muscle tone. Coordination normal.  Skin: No rash noted. No erythema.  Psychiatric: He has a normal mood and affect. His behavior is normal.    ED Course  Procedures (including critical care time) Labs Review Labs Reviewed  CBC WITH DIFFERENTIAL/PLATELET - Abnormal; Notable for the following:    Monocytes Relative 16 (*)    Eosinophils Relative 7 (*)    All other components within normal limits  COMPREHENSIVE METABOLIC PANEL - Abnormal; Notable for the following:    Glucose, Bld 100 (*)    Creatinine, Ser 1.61 (*)    Total Protein 5.9 (*)    Albumin 2.8 (*)    ALT 11 (*)    GFR calc non Af Amer 39 (*)    GFR calc Af Amer 46 (*)    All other components within normal limits  TROPONIN I - Abnormal; Notable for the following:    Troponin I 0.04 (*)    All other components within normal limits    Imaging Review Dg Chest Portable 1 View  09/24/2014   CLINICAL DATA:  Chest pain, history of COPD, atrial fibrillation, and diabetes ; history of coronary stent placement  EXAM: PORTABLE CHEST - 1 VIEW   COMPARISON:  Portable chest x-ray dated June 18, 2014  FINDINGS: The lungs are adequately inflated. There is no focal infiltrate. There is minimal subsegmental atelectasis or scarring at the right lung base. The heart is top-normal in size. The pulmonary vascularity is not engorged. The mediastinum is normal in width. No significant pleural effusion is observed. The bony thorax exhibits no acute abnormality.  IMPRESSION: COPD with minimal right basilar atelectasis or scarring. There is no CHF nor pneumonia.   Electronically Signed   By: David  Martinique M.D.   On: 09/24/2014 15:06     EKG Interpretation   Date/Time:  Tuesday September 24 2014 13:00:53 EDT Ventricular Rate:  123 PR Interval:    QRS Duration:  94 QT Interval:  395 QTC Calculation: 565 R Axis:   10 Text Interpretation:  Atrial fibrillation Low voltage, extremity and  precordial leads Prolonged QT interval Confirmed by Shylee Durrett  MD, Emlyn Maves  [phone]) on 09/24/2014 1:04:45 PM     CRITICAL CARE Performed by: Geroge Gilliam L Total critical care time 35 Critical care time was exclusive of separately billable procedures and treating other patients. Critical care was necessary to treat or prevent imminent or life-threatening deterioration. Critical care was time spent personally by me on the following activities: development of treatment plan with patient and/or surrogate as well as nursing, discussions with consultants, evaluation of patient's response to treatment, examination of patient, obtaining history from patient or surrogate, ordering and performing treatments and interventions, ordering and review of laboratory studies, ordering and review of radiographic studies, pulse oximetry and re-evaluation of patient's condition.  MDM   Final diagnoses:  NSTEMI (non-ST elevated myocardial infarction)    nstemi with chronic afib.  Pt is a DNR,  Will be admitted to [facility]    Milton Ferguson, MD 09/24/14 1552  Milton Ferguson, MD 09/24/14  [phone]

## 2014-09-25 DIAGNOSIS — I482 Chronic atrial fibrillation: Secondary | ICD-10-CM

## 2014-09-25 DIAGNOSIS — L8990 Pressure ulcer of unspecified site, unspecified stage: Secondary | ICD-10-CM | POA: Insufficient documentation

## 2014-09-25 DIAGNOSIS — I4891 Unspecified atrial fibrillation: Secondary | ICD-10-CM | POA: Diagnosis not present

## 2014-09-25 DIAGNOSIS — R0789 Other chest pain: Secondary | ICD-10-CM

## 2014-09-25 DIAGNOSIS — I10 Essential (primary) hypertension: Secondary | ICD-10-CM

## 2014-09-25 DIAGNOSIS — I959 Hypotension, unspecified: Secondary | ICD-10-CM | POA: Diagnosis not present

## 2014-09-25 LAB — COMPREHENSIVE METABOLIC PANEL
ALK PHOS: 61 U/L (ref 38–126)
ALT: 11 U/L — ABNORMAL LOW (ref 17–63)
AST: 26 U/L (ref 15–41)
Albumin: 2.8 g/dL — ABNORMAL LOW (ref 3.5–5.0)
Anion gap: 7 (ref 5–15)
BUN: 14 mg/dL (ref 6–20)
CHLORIDE: 107 mmol/L (ref 101–111)
CO2: 27 mmol/L (ref 22–32)
Calcium: 8.7 mg/dL — ABNORMAL LOW (ref 8.9–10.3)
Creatinine, Ser: 1.55 mg/dL — ABNORMAL HIGH (ref 0.61–1.24)
GFR calc non Af Amer: 41 mL/min — ABNORMAL LOW (ref >60)
GFR, EST AFRICAN AMERICAN: 48 mL/min — AB (ref >60)
Glucose, Bld: 121 mg/dL — ABNORMAL HIGH (ref 65–99)
Potassium: 3.6 mmol/L (ref 3.5–5.1)
Sodium: 141 mmol/L (ref 135–145)
TOTAL PROTEIN: 6 g/dL — AB (ref 6.5–8.1)
Total Bilirubin: 0.9 mg/dL (ref 0.3–1.2)

## 2014-09-25 LAB — GLUCOSE, CAPILLARY
GLUCOSE-CAPILLARY: 120 mg/dL — AB (ref 65–99)
GLUCOSE-CAPILLARY: 133 mg/dL — AB (ref 65–99)
GLUCOSE-CAPILLARY: 60 mg/dL — AB (ref 65–99)
Glucose-Capillary: 82 mg/dL (ref 65–99)
Glucose-Capillary: 81 mg/dL (ref 65–99)

## 2014-09-25 LAB — CBC
HCT: 42.4 % (ref 39.0–52.0)
Hemoglobin: 13.7 g/dL (ref 13.0–17.0)
MCH: 31.8 pg (ref 26.0–34.0)
MCHC: 32.3 g/dL (ref 30.0–36.0)
MCV: 98.4 fL (ref 78.0–100.0)
PLATELETS: 270 10*3/uL (ref 150–400)
RBC: 4.31 MIL/uL (ref 4.22–5.81)
RDW: 15.4 % (ref 11.5–15.5)
WBC: 9.1 10*3/uL (ref 4.0–10.5)

## 2014-09-25 LAB — TROPONIN I
TROPONIN I: 0.06 ng/mL — AB (ref <0.031)
Troponin I: 0.05 ng/mL — ABNORMAL HIGH (ref <0.031)

## 2014-09-25 MED ORDER — CHLORHEXIDINE GLUCONATE CLOTH 2 % EX PADS
6.0000 | MEDICATED_PAD | Freq: Every day | CUTANEOUS | Status: DC
  Administered 2014-09-25 – 2014-09-26 (×2): 6 via TOPICAL

## 2014-09-25 MED ORDER — MUPIROCIN 2 % EX OINT
1.0000 "application " | TOPICAL_OINTMENT | Freq: Two times a day (BID) | CUTANEOUS | Status: DC
  Administered 2014-09-25 – 2014-09-26 (×3): 1 via NASAL
  Filled 2014-09-25 (×2): qty 22

## 2014-09-25 NOTE — Progress Notes (Signed)
Initial Nutrition Assessment  DOCUMENTATION CODES: Not applicable  INTERVENTION: Recommend Swallow eval to determine best texture diet, will follow recs.   Once diet advanced-Ensure Enlive (each supplement provides 350kcal and 20 grams of protein)-if placed on dysphagia level one will order magic cup  Recommend MVI with minerals  Monitor PO intake once diet advanced. Will evaluate if pt needs more structured TF regimen.   NUTRITION DIAGNOSIS: Inadequate oral intake related to inability to eat as evidenced by NPO status.  GOAL: Patient will meet greater than or equal to 90% of their needs  MONITOR: PO intake, Supplement acceptance, Diet advancement, Labs, Skin, I & O's, SLT recs  REASON FOR ASSESSMENT: Malnutrition Screening Tool    ASSESSMENT: 80-y/o male PMHx: DM, HTN, COPD, melanoma, Gerd-lived in SNF since January 2016 after large stroke affecting left side of body with significant hemiplegia. Presents with with lower chest/epigastric pain after he had been given PEG tube feeding.  Pt is hard to understand and seems confused. Mostly spoke to family members at bedside:  Pt had stoke in January. Since that time pt has been a resident at [facility]. They report his appetite has been Sub par; has hx of  of aspiration PNA and has been on mechanically altered diets (Pt reports nectar thick liquids/mech soft). They think his poor PO intake is due to an aversion to the abnormal textures. Because of this reason, he had a PEG placed. Per their report, whenever pt doesn't eat well at a meal the nursing staff will bolus an Ensure through his PEG. Family member does not think he receives scheduled PEG feeding. They do not know any vit/minerals he takes  He does have hx of high blood pressure and is supposed to be on a heart healthy diet. Pt does not like the foods compliant with the diet. He states his favorite foods are "things we dont want him to have".   Family members report his weight at  time of stroke was ~242 lbs. They do think he has lost a significant amount of weight. He no longer is ambulatory. Atrophy of lower muscles may account for some wt loss.   They do not believe pt has had Ensure orally, but think he may like chocolate. Will order once diet advanced. Will follow speech recs, oral intake of meals and determine if he needs more structured TF regimen.  Note poor skin status - would benefit from quicker diet advancement  Height: Ht Readings from Last 1 Encounters:  09/24/14 5\' 11"  (1.803 m)    Weight: Wt Readings from Last 1 Encounters:  09/24/14 206 lb 6.4 oz (93.622 kg)    Ideal Body Weight:  78.2 kg  Wt Readings from Last 10 Encounters:  09/24/14 206 lb 6.4 oz (93.622 kg)    BMI:  Body mass index is 28.8 kg/(m^2).  Estimated Nutritional Needs: Kcal:  1700-1900 (18-20 kcal/kg) Protein:  94-110 (1.2-1.4 g/kg IBW) Fluid:  1.7-1.9 liters  Skin:  PU stage 2 buttocks, PU stage 1 ankle. Also MSAD groin. +Abrasions, excoriation, ecchymosis, dryness  Diet Order:  Diet NPO time specified  EDUCATION NEEDS: No education needs identified at this time   Intake/Output Summary (Last 24 hours) at 09/25/14 1209 Last data filed at 09/25/14 1157  Gross per 24 hour  Intake 1273.33 ml  Output    200 ml  Net 1073.33 ml    Last BM:  7/6  Burtis Junes RD, LDN Nutrition Pager: [phone] 09/25/2014 12:09 PM

## 2014-09-25 NOTE — Progress Notes (Signed)
Hypoglycemic Event  CBG: 60  Treatment: 15 GM carbohydrate snack  Symptoms: None  Follow-up CBG: Time:1737 CBG Result:82  Possible Reasons for Event: Inadequate meal intake; Patient is NPO & has PEG tube   Comments/MD notified: Yes    Camary Sosa L  Remember to initiate Hypoglycemia Order Set & complete

## 2014-09-25 NOTE — Clinical Social Work Note (Signed)
Clinical Social Work Assessment  Patient Details  Name: Alejandro Diaz MRN: [id number] Date of Birth: December 23, 1934  Date of referral:  09/25/14               Reason for consult:  Facility Placement                Permission sought to share information with:    Permission granted to share information::     Name::        Agency::     Relationship::     Contact Information:     Housing/Transportation Living arrangements for the past 2 months:  [place] of Information:  Adult Children Patient Interpreter Needed:  None Criminal Activity/Legal Involvement Pertinent to Current Situation/Hospitalization:  No - Comment as needed Significant Relationships:  Adult Children Lives with:    Do you feel safe going back to the place where you live?  Yes Need for family participation in patient care:  Yes (Comment)  Care giving concerns:  Facility resident.    Social Worker assessment / plan:  CSW met with patient, his daughter, Alejandro Diaz, was at bedside and provided the history.  Mrs. Pruitt advised that patient was has been in [facility] since March 2016, she advised that he uses a wheelchair and that facility staff assists with ADLs.  She stated that she and other family members visit patient very frequently. Mrs. Pruitt advised that she desires for patient to return to [facility] upon discharge.  CSW spoke with Jackelyn Poling at [facility].  Debbie confirmed Mrs. Pruitt's statements.  She advised that patient could return to the facility upon discharge.   Employment status:  Retired Forensic scientist:  Medicare PT Recommendations:  Not assessed at this time Information / Referral to community resources:     Patient/Family's Response to care:  Family is agreeable for patient to return to SNF.   Patient/Family's Understanding of and Emotional Response to Diagnosis, Current Treatment, and Prognosis: Family realizes that patient will need SNF care due to his diagnosis, treatment needs and  prognosis.    Emotional Assessment Appearance:  Appears older than stated age Attitude/Demeanor/Rapport:  Combative, Unable to Assess Affect (typically observed):  Unable to Assess Orientation:    Alcohol / Substance use:  Not Applicable Psych involvement (Current and /or in the community):  No (Comment)  Discharge Needs  Concerns to be addressed:  Discharge Planning Concerns Readmission within the last 30 days:  No Current discharge risk:  None Barriers to Discharge:  No Barriers Identified   Ihor Gully, LCSW 09/25/2014, 3:26 PM [phone]

## 2014-09-25 NOTE — Progress Notes (Signed)
[place] reported that patient c/o headache and had an episode where he was shaking. Upon assessment, patient denied any current head pain but did report a brief headache on the LEFT side of his head, no shaking noted, no RIGHT sided deficit noted at this time. Old LEFT sided deficit noted, pre-admission baseline. MD notified.

## 2014-09-25 NOTE — Care Management Note (Signed)
Case Management Note  Patient Details  Name: Alejandro Diaz MRN: [id number] Date of Birth: [birth year]  Subjective/Objective:                  Pt admitted from [facility] with CP. Anticipate pt will discharge back to facility.  Action/Plan: CSW is aware and will arrange discharge back to facility when medically stable.  Expected Discharge Date:   09/26/14               Expected Discharge Plan:  Skilled Nursing Facility  In-House Referral:  Clinical Social Work  Discharge planning Services  CM Consult  Post Acute Care Choice:  NA Choice offered to:  NA  DME Arranged:    DME Agency:     HH Arranged:    HH Agency:     Status of Service:  In process, will continue to follow  Medicare Important Message Given:    Date Medicare IM Given:    Medicare IM give by:    Date Additional Medicare IM Given:    Additional Medicare Important Message give by:     If discussed at [place] of Stay Meetings, dates discussed:    Additional Comments:  Joylene Draft, RN 09/25/2014, 8:07 AM

## 2014-09-25 NOTE — Progress Notes (Signed)
[facility] PROGRESS NOTE  Alejandro Diaz OIT:[id number] DOB: Jul 20, 1934    PCP:   Jani Gravel, MD   HPI: Alejandro Diaz is an 80 y.o. male  Admitted for typical CP, suspicious for GI etiology, with slight elevated of troponins (insignificant), along with afib with slight and intermittent RVR, hx of prior CVA, GI bleed, HTN, DM, on anticoagulation with Eliquis.  Cardiology was consulted and wait any further recommnedation.  He has a PEG tube, but able to eat.    Rewiew of Systems:  Constitutional: Negative for malaise, fever and chills. No significant weight loss or weight gain Eyes: Negative for eye pain, redness and discharge, diplopia, visual changes, or flashes of light. ENMT: Negative for ear pain, hoarseness, nasal congestion, sinus pressure and sore throat. No headaches; tinnitus, drooling, or problem swallowing. Cardiovascular: Negative for chest pain, palpitations, diaphoresis, dyspnea and peripheral edema. ; No orthopnea, PND Respiratory: Negative for cough, hemoptysis, wheezing and stridor. No pleuritic chestpain. Gastrointestinal: Negative for nausea, vomiting, diarrhea, constipation, abdominal pain, melena, blood in stool, hematemesis, jaundice and rectal bleeding.    Genitourinary: Negative for frequency, dysuria, incontinence,flank pain and hematuria; Musculoskeletal: Negative for back pain and neck pain. Negative for swelling and trauma.;  Skin: . Negative for pruritus, rash, abrasions, bruising and skin lesion.; ulcerations Neuro: Negative for headache, lightheadedness and neck stiffness. Negative for weakness, altered level of consciousness , altered mental status, extremity weakness, burning feet, involuntary movement, seizure and syncope.  Psych: negative for anxiety, depression, insomnia, tearfulness, panic attacks, hallucinations, paranoia, suicidal or homicidal ideation    Past Medical History  Diagnosis Date  . Diabetes mellitus without complication   . Hypertension    . Arthritis   . COPD (chronic obstructive pulmonary disease)   . Cancer     melanoma  . Atrial fibrillation   . GERD (gastroesophageal reflux disease)     Past Surgical History  Procedure Laterality Date  . Coronary stent placement    . Cardiac surgery    . Brain surgery    . Back surgery    . Splenectomy      Medications:  HOME MEDS: Prior to Admission medications   Medication Sig Start Date End Date Taking? Authorizing Provider  apixaban (ELIQUIS) 5 MG TABS tablet 5 mg by PEG Tube route 2 (two) times daily.   Yes Historical Provider, MD  aspirin EC 81 MG tablet 81 mg by PEG Tube route daily.   Yes Historical Provider, MD  budesonide-formoterol (SYMBICORT) 160-4.5 MCG/ACT inhaler Inhale 2 puffs into the lungs 2 (two) times daily.   Yes Historical Provider, MD  cholecalciferol (VITAMIN D) 1000 UNITS tablet 1,000 Units by PEG Tube route daily.    Yes Historical Provider, MD  dofetilide (TIKOSYN) 500 MCG capsule 500 mcg by PEG Tube route every 12 (twelve) hours.    Yes Historical Provider, MD  famotidine (PEPCID) 20 MG tablet 20 mg by PEG Tube route 2 (two) times daily.   Yes Historical Provider, MD  ferrous sulfate 220 (44 FE) MG/5ML solution Place 220 mg into feeding tube daily.   Yes Historical Provider, MD  glipiZIDE (GLUCOTROL) 5 MG tablet 5 mg by PEG Tube route daily before breakfast.   Yes Historical Provider, MD  guaifenesin (ROBITUSSIN) 100 MG/5ML syrup 600 mg by PEG Tube route 2 (two) times daily.   Yes Historical Provider, MD  HYDROcodone-acetaminophen (NORCO/VICODIN) 5-325 MG per tablet 1 tablet by PEG Tube route every 6 (six) hours as needed for moderate pain.  Yes Historical Provider, MD  ipratropium-albuterol (DUONEB) 0.5-2.5 (3) MG/3ML SOLN Take 3 mLs by nebulization every 6 (six) hours as needed (shortness of breath/wheezing).   Yes Historical Provider, MD  isosorbide dinitrate (ISORDIL) 30 MG tablet 30 mg by PEG Tube route 3 (three) times daily.   Yes Historical  Provider, MD  levothyroxine (SYNTHROID, LEVOTHROID) 125 MCG tablet 125 mcg by PEG Tube route daily before breakfast.   Yes Historical Provider, MD  Melatonin 3 MG TABS Take 3 mg by mouth at bedtime.   Yes Historical Provider, MD  metoprolol (LOPRESSOR) 100 MG tablet 100 mg by PEG Tube route 2 (two) times daily.   Yes Historical Provider, MD  nitroGLYCERIN (NITROSTAT) 0.4 MG SL tablet Place 0.4 mg under the tongue every 5 (five) minutes as needed for chest pain.   Yes Historical Provider, MD  Nutritional Supplements (FEEDING SUPPLEMENT, JEVITY 1.5 CAL,) LIQD Place 120 mLs into feeding tube 3 (three) times daily.   Yes Historical Provider, MD  paroxetine (PAXIL) 10 MG/5ML suspension 40 mg by PEG Tube route daily.   Yes Historical Provider, MD  polyethylene glycol (MIRALAX / GLYCOLAX) packet 17 g by PEG Tube route daily.   Yes Historical Provider, MD  pravastatin (PRAVACHOL) 40 MG tablet 40 mg by PEG Tube route at bedtime.   Yes Historical Provider, MD  promethazine (PHENERGAN) 6.25 MG/5ML syrup 12.5 mg by PEG Tube route every 6 (six) hours as needed for nausea or vomiting.   Yes Historical Provider, MD  ranitidine (ZANTAC) 150 MG/10ML syrup Place 300 mg into feeding tube daily.   Yes Historical Provider, MD  tiotropium (SPIRIVA) 18 MCG inhalation capsule Place 18 mcg into inhaler and inhale daily.   Yes Historical Provider, MD  traZODone (DESYREL) 100 MG tablet 100 mg by PEG Tube route at bedtime as needed for sleep.   Yes Historical Provider, MD  albuterol (PROVENTIL HFA;VENTOLIN HFA) 108 (90 BASE) MCG/ACT inhaler Inhale 1-2 puffs into the lungs every 6 (six) hours as needed for wheezing or shortness of breath. Patient not taking: Reported on 09/24/2014 06/16/13   Carmin Muskrat, MD  sennosides-docusate sodium (SENOKOT-S) 8.6-50 MG tablet 1 tablet by PEG Tube route daily as needed for constipation.     Historical Provider, MD     Allergies:  No Known Allergies  Social History:   reports that he  has quit smoking. He has never used smokeless tobacco. He reports that he does not drink alcohol or use illicit drugs.  Family History: History reviewed. No pertinent family history.   Physical Exam: Filed Vitals:   09/24/14 1706 09/24/14 1943 09/25/14 0420 09/25/14 0648  BP: 112/75  123/82   Pulse: 103  120   Temp: 98 F (36.7 C)  97.9 F (36.6 C)   TempSrc: Oral  Oral   Resp:   22   Height: 5\' 11"  (1.803 m)     Weight: 93.622 kg (206 lb 6.4 oz)     SpO2: 98% 97% 99% 98%   Blood pressure 123/82, pulse 120, temperature 97.9 F (36.6 C), temperature source Oral, resp. rate 22, height 5\' 11"  (1.803 m), weight 93.622 kg (206 lb 6.4 oz), SpO2 98 %.  GEN:  Pleasant  patient lying in the stretcher in no acute distress; cooperative with exam. PSYCH:  alert and oriented x4; does not appear anxious or depressed; affect is appropriate. HEENT: Mucous membranes pink and anicteric; PERRLA; EOM intact; no cervical lymphadenopathy nor thyromegaly or carotid bruit; no JVD; There were no stridor.  Neck is very supple. Breasts:: Not examined CHEST WALL: No tenderness CHEST: Normal respiration, clear to auscultation bilaterally.  HEART: Regular rate and rhythm.  There are no murmur, rub, or gallops.   BACK: No kyphosis or scoliosis; no CVA tenderness ABDOMEN: soft and non-tender; no masses, no organomegaly, normal abdominal bowel sounds; no pannus; no intertriginous candida. There is no rebound and no distention. Rectal Exam: Not done EXTREMITIES: No bone or joint deformity; age-appropriate arthropathy of the hands and knees; no edema; no ulcerations.  There is no calf tenderness. Genitalia: not examined PULSES: 2+ and symmetric SKIN: Normal hydration no rash or ulceration CNS: Cranial nerves 2-12 grossly intact no focal lateralizing neurologic deficit.  Speech is fluent; uvula elevated with phonation, facial symmetry and tongue midline. DTR are normal bilaterally, cerebella exam is intact,  barbinski is negative and strengths are equaled bilaterally.  No sensory loss.   Labs on Admission:  Basic Metabolic Panel:  Recent Labs Lab 09/24/14 1333 09/25/14 0243  NA 140 141  K 3.5 3.6  CL 105 107  CO2 27 27  GLUCOSE 100* 121*  BUN 12 14  CREATININE 1.61* 1.55*  CALCIUM 8.9 8.7*   Liver Function Tests:  Recent Labs Lab 09/24/14 1333 09/25/14 0243  AST 27 26  ALT 11* 11*  ALKPHOS 60 61  BILITOT 0.9 0.9  PROT 5.9* 6.0*  ALBUMIN 2.8* 2.8*   CBC:  Recent Labs Lab 09/24/14 1333 09/25/14 0243  WBC 6.6 9.1  NEUTROABS 3.1  --   HGB 14.0 13.7  HCT 43.7 42.4  MCV 98.0 98.4  PLT 259 270   Cardiac Enzymes:  Recent Labs Lab 09/24/14 1333 09/24/14 2003 09/25/14 0243  TROPONINI 0.04* 0.05* 0.06*    CBG:  Recent Labs Lab 09/24/14 1736 09/24/14 1841 09/24/14 2139 09/25/14 0751  GLUCAP 67 109* 84 120*     Radiological Exams on Admission: Dg Chest Portable 1 View  09/24/2014   CLINICAL DATA:  Chest pain, history of COPD, atrial fibrillation, and diabetes ; history of coronary stent placement  EXAM: PORTABLE CHEST - 1 VIEW  COMPARISON:  Portable chest x-ray dated June 18, 2014  FINDINGS: The lungs are adequately inflated. There is no focal infiltrate. There is minimal subsegmental atelectasis or scarring at the right lung base. The heart is top-normal in size. The pulmonary vascularity is not engorged. The mediastinum is normal in width. No significant pleural effusion is observed. The bony thorax exhibits no acute abnormality.  IMPRESSION: COPD with minimal right basilar atelectasis or scarring. There is no CHF nor pneumonia.   Electronically Signed   By: David  Martinique M.D.   On: 09/24/2014 15:06    EKG: Independently reviewed.    Assessment/Plan Present on Admission:  . Chest pain . AF (atrial fibrillation) . Essential hypertension   PLAN:  Chest pain:  Doubt ACS.  Will continue with current therapy. AF: Already on Tykosin, and 200mg  Lopressor  per day.  On Eliquis.  Continue with current therapy.  HR is a little high.  Await cardiology consultation. HTN:  BP is controlled.  Other plans as per orders.  Code Status: Oda Kilts, MD. [facility] Pager [phone] 7pm to 7am.  09/25/2014, 9:52 AM

## 2014-09-25 NOTE — Progress Notes (Signed)
38 Spoke with Dr.Le regarding patient's symptoms & the following new order given: 1.) Obtain blood cultures for temp > 101.0  Patient and family made aware

## 2014-09-26 ENCOUNTER — Encounter (HOSPITAL_COMMUNITY): Payer: Self-pay | Admitting: Adult Health

## 2014-09-26 DIAGNOSIS — R2681 Unsteadiness on feet: Secondary | ICD-10-CM | POA: Diagnosis not present

## 2014-09-26 DIAGNOSIS — L03113 Cellulitis of right upper limb: Secondary | ICD-10-CM | POA: Diagnosis not present

## 2014-09-26 DIAGNOSIS — R278 Other lack of coordination: Secondary | ICD-10-CM | POA: Diagnosis not present

## 2014-09-26 DIAGNOSIS — E162 Hypoglycemia, unspecified: Secondary | ICD-10-CM | POA: Diagnosis not present

## 2014-09-26 DIAGNOSIS — I4891 Unspecified atrial fibrillation: Secondary | ICD-10-CM | POA: Diagnosis not present

## 2014-09-26 DIAGNOSIS — Z741 Need for assistance with personal care: Secondary | ICD-10-CM | POA: Diagnosis not present

## 2014-09-26 DIAGNOSIS — E079 Disorder of thyroid, unspecified: Secondary | ICD-10-CM | POA: Diagnosis not present

## 2014-09-26 DIAGNOSIS — E119 Type 2 diabetes mellitus without complications: Secondary | ICD-10-CM | POA: Diagnosis not present

## 2014-09-26 DIAGNOSIS — I959 Hypotension, unspecified: Secondary | ICD-10-CM | POA: Diagnosis not present

## 2014-09-26 DIAGNOSIS — I429 Cardiomyopathy, unspecified: Secondary | ICD-10-CM | POA: Diagnosis not present

## 2014-09-26 DIAGNOSIS — E11649 Type 2 diabetes mellitus with hypoglycemia without coma: Secondary | ICD-10-CM | POA: Diagnosis not present

## 2014-09-26 DIAGNOSIS — I69322 Dysarthria following cerebral infarction: Secondary | ICD-10-CM | POA: Diagnosis not present

## 2014-09-26 DIAGNOSIS — I214 Non-ST elevation (NSTEMI) myocardial infarction: Secondary | ICD-10-CM | POA: Diagnosis not present

## 2014-09-26 DIAGNOSIS — I739 Peripheral vascular disease, unspecified: Secondary | ICD-10-CM

## 2014-09-26 DIAGNOSIS — I2510 Atherosclerotic heart disease of native coronary artery without angina pectoris: Secondary | ICD-10-CM

## 2014-09-26 DIAGNOSIS — Z79899 Other long term (current) drug therapy: Secondary | ICD-10-CM | POA: Diagnosis not present

## 2014-09-26 DIAGNOSIS — I350 Nonrheumatic aortic (valve) stenosis: Secondary | ICD-10-CM | POA: Diagnosis not present

## 2014-09-26 DIAGNOSIS — L03211 Cellulitis of face: Secondary | ICD-10-CM | POA: Diagnosis not present

## 2014-09-26 DIAGNOSIS — R1312 Dysphagia, oropharyngeal phase: Secondary | ICD-10-CM | POA: Diagnosis not present

## 2014-09-26 DIAGNOSIS — M6281 Muscle weakness (generalized): Secondary | ICD-10-CM | POA: Diagnosis not present

## 2014-09-26 DIAGNOSIS — D72829 Elevated white blood cell count, unspecified: Secondary | ICD-10-CM | POA: Diagnosis not present

## 2014-09-26 DIAGNOSIS — K219 Gastro-esophageal reflux disease without esophagitis: Secondary | ICD-10-CM | POA: Diagnosis not present

## 2014-09-26 DIAGNOSIS — W07XXXA Fall from chair, initial encounter: Secondary | ICD-10-CM | POA: Diagnosis not present

## 2014-09-26 DIAGNOSIS — E785 Hyperlipidemia, unspecified: Secondary | ICD-10-CM | POA: Diagnosis not present

## 2014-09-26 DIAGNOSIS — D649 Anemia, unspecified: Secondary | ICD-10-CM | POA: Diagnosis not present

## 2014-09-26 DIAGNOSIS — R531 Weakness: Secondary | ICD-10-CM | POA: Diagnosis not present

## 2014-09-26 DIAGNOSIS — J188 Other pneumonia, unspecified organism: Secondary | ICD-10-CM | POA: Diagnosis not present

## 2014-09-26 DIAGNOSIS — I639 Cerebral infarction, unspecified: Secondary | ICD-10-CM

## 2014-09-26 DIAGNOSIS — I69354 Hemiplegia and hemiparesis following cerebral infarction affecting left non-dominant side: Secondary | ICD-10-CM | POA: Diagnosis not present

## 2014-09-26 DIAGNOSIS — I480 Paroxysmal atrial fibrillation: Secondary | ICD-10-CM

## 2014-09-26 DIAGNOSIS — R404 Transient alteration of awareness: Secondary | ICD-10-CM | POA: Diagnosis not present

## 2014-09-26 DIAGNOSIS — Z7401 Bed confinement status: Secondary | ICD-10-CM | POA: Diagnosis not present

## 2014-09-26 DIAGNOSIS — I82621 Acute embolism and thrombosis of deep veins of right upper extremity: Secondary | ICD-10-CM | POA: Diagnosis not present

## 2014-09-26 DIAGNOSIS — R630 Anorexia: Secondary | ICD-10-CM | POA: Diagnosis not present

## 2014-09-26 DIAGNOSIS — G4700 Insomnia, unspecified: Secondary | ICD-10-CM | POA: Diagnosis not present

## 2014-09-26 DIAGNOSIS — R1310 Dysphagia, unspecified: Secondary | ICD-10-CM | POA: Diagnosis not present

## 2014-09-26 DIAGNOSIS — R262 Difficulty in walking, not elsewhere classified: Secondary | ICD-10-CM | POA: Diagnosis not present

## 2014-09-26 DIAGNOSIS — E0840 Diabetes mellitus due to underlying condition with diabetic neuropathy, unspecified: Secondary | ICD-10-CM

## 2014-09-26 DIAGNOSIS — I481 Persistent atrial fibrillation: Secondary | ICD-10-CM

## 2014-09-26 DIAGNOSIS — I6350 Cerebral infarction due to unspecified occlusion or stenosis of unspecified cerebral artery: Secondary | ICD-10-CM | POA: Diagnosis not present

## 2014-09-26 DIAGNOSIS — Z13220 Encounter for screening for lipoid disorders: Secondary | ICD-10-CM | POA: Diagnosis not present

## 2014-09-26 DIAGNOSIS — N182 Chronic kidney disease, stage 2 (mild): Secondary | ICD-10-CM | POA: Diagnosis not present

## 2014-09-26 DIAGNOSIS — I10 Essential (primary) hypertension: Secondary | ICD-10-CM | POA: Diagnosis not present

## 2014-09-26 DIAGNOSIS — F329 Major depressive disorder, single episode, unspecified: Secondary | ICD-10-CM | POA: Diagnosis not present

## 2014-09-26 DIAGNOSIS — G8190 Hemiplegia, unspecified affecting unspecified side: Secondary | ICD-10-CM | POA: Diagnosis not present

## 2014-09-26 DIAGNOSIS — L8990 Pressure ulcer of unspecified site, unspecified stage: Secondary | ICD-10-CM | POA: Diagnosis not present

## 2014-09-26 DIAGNOSIS — L89302 Pressure ulcer of unspecified buttock, stage 2: Secondary | ICD-10-CM | POA: Diagnosis not present

## 2014-09-26 DIAGNOSIS — I248 Other forms of acute ischemic heart disease: Secondary | ICD-10-CM

## 2014-09-26 DIAGNOSIS — M1990 Unspecified osteoarthritis, unspecified site: Secondary | ICD-10-CM | POA: Diagnosis not present

## 2014-09-26 DIAGNOSIS — R0789 Other chest pain: Secondary | ICD-10-CM | POA: Diagnosis not present

## 2014-09-26 DIAGNOSIS — R1013 Epigastric pain: Secondary | ICD-10-CM | POA: Diagnosis not present

## 2014-09-26 DIAGNOSIS — R079 Chest pain, unspecified: Secondary | ICD-10-CM | POA: Diagnosis not present

## 2014-09-26 LAB — GLUCOSE, CAPILLARY
Glucose-Capillary: 101 mg/dL — ABNORMAL HIGH (ref 65–99)
Glucose-Capillary: 121 mg/dL — ABNORMAL HIGH (ref 65–99)

## 2014-09-26 MED ORDER — INSULIN ASPART 100 UNIT/ML ~~LOC~~ SOLN: 0.0000 [IU] | Freq: Three times a day (TID) | SUBCUTANEOUS | Status: DC

## 2014-09-26 MED ORDER — POTASSIUM CHLORIDE CRYS ER 20 MEQ PO TBCR
40.0000 meq | EXTENDED_RELEASE_TABLET | Freq: Once | ORAL | Status: AC
  Administered 2014-09-26: 40 meq via ORAL
  Filled 2014-09-26: qty 2

## 2014-09-26 NOTE — Consult Note (Signed)
CARDIOLOGY CONSULT NOTE   Patient ID: Alejandro Diaz MRN: [id number] DOB/AGE: 78/09/1934 78 y.o.  Admit Date: 09/24/2014 Referring Physician: PTH Primary Physician: Jani Gravel, MD Consulting Cardiologist: Kate Sable  Primary Cardiologist: Beacher May MD [facility]) Reason for Consultation: Chest Pain, Atrial fib  Clinical Summary [name] is a 80 y.o.male with known history of CAD, (stents placed in unknown arteries in 2006 and 2009 in [place], along with aortic stent- [facility]) ICM EF of 40-45% per last echo in 04/18/2014,  atrial fib, embolic CVA with left sided hemiplegia, CHADS VASC Score of 5, on Eliquis,  with PEG tube in situ. He received a nutritional feeding at lunch and began to experience chest and epigastric pain with associated nausea and dyspnea with mild diaphoresis.   In ER he was found to be hypotensive with BP of 81/64, HR 89, O2 sat 95%, CXR demonstrated COPD, without CHF or pneumonia. EKG revealed atrial fib with rate of 123 bpm, which was inaccurate, HR 80's. Potassium 3.6, creatinine 1.55, troponin 0.04. Subsequent troponin 0.05;0.06; 0.05 respectively. There was not evidence of leukocytosis, or anemia. He was treated with IV fluids after bolus, and toradol with improvement in symptoms and BP. We are asked to see him for cardiac evaluation of troponin elevation and atrial fib with uncontrolled rate. On Tikosyn..  Family states he wishes to be established with our practice as he is now living in [place]. He is still NPO in order for Speech Therapy to see him concerning swallowing issues. Family also states that he has some tremors on the right side with a severe left temple headache. Lasted several seconds.   No Known Allergies  Medications Scheduled Medications: . albuterol  2.5 mg Nebulization BID  . antiseptic oral rinse  7 mL Mouth Rinse BID  . apixaban  5 mg Per Tube BID  . aspirin EC  81 mg Oral Daily  . budesonide-formoterol  2 puff  Inhalation BID  . Chlorhexidine Gluconate Cloth  6 each Topical Q0600  . cholecalciferol  1,000 Units Oral Daily  . dofetilide  500 mcg Per Tube Q12H  . famotidine  20 mg Per Tube BID  . feeding supplement (ENSURE ENLIVE)  120 mL Oral TID  . ferrous sulfate  220 mg Per Tube Daily  . glipiZIDE  5 mg Per Tube QAC breakfast  . guaiFENesin  600 mg Per Tube BID  . insulin aspart  0-15 Units Subcutaneous TID WC  . insulin aspart  0-5 Units Subcutaneous QHS  . isosorbide dinitrate  30 mg Per Tube TID  . ketorolac  15 mg Intravenous Once  . levothyroxine  125 mcg Per Tube QAC breakfast  . metoprolol  100 mg Per Tube BID  . mupirocin ointment  1 application Nasal BID  . PARoxetine  40 mg Per Tube Daily  . polyethylene glycol  17 g Per Tube Daily  . pravastatin  40 mg Per Tube QHS  . ranitidine  300 mg Per Tube Daily  . sodium chloride  3 mL Intravenous Q12H  . tiotropium  18 mcg Inhalation Daily    Infusions: . sodium chloride 100 mL/hr at 09/26/14 0843    PRN Medications: bacitracin, HYDROcodone-acetaminophen, ipratropium-albuterol, nitroGLYCERIN, ondansetron **OR** ondansetron (ZOFRAN) IV, promethazine, senna-docusate, traZODone   Past Medical History  Diagnosis Date  . Diabetes mellitus without complication   . Hypertension   . Arthritis   . COPD (chronic obstructive pulmonary disease)   . Cancer     melanoma  .  Atrial fibrillation   . GERD (gastroesophageal reflux disease)   . CAD (coronary artery disease)     Stents to unknown arteries in 2006,2009  . CVA (cerebral infarction) 05/8935    Embolic Left sided hemiplegia.   . Ischemic cardiomyopathy     EF of 40-45%    Past Surgical History  Procedure Laterality Date  . Coronary stent placement    . Cardiac surgery    . Brain surgery    . Back surgery    . Splenectomy      Family History  Problem Relation Age of Onset  . Heart attack Father     Social History [name] reports that he has quit smoking. He has  never used smokeless tobacco. [name] reports that he does not drink alcohol.  Review of Systems Complete review of systems are found to be negative unless outlined in H&P above.  Physical Examination Blood pressure 172/75, pulse 60, temperature 97.3 F (36.3 C), temperature source Oral, resp. rate 20, height 5\' 11"  (1.803 m), weight 206 lb 6.4 oz (93.622 kg), SpO2 98 %.  Intake/Output Summary (Last 24 hours) at 09/26/14 0856 Last data filed at 09/26/14 0649  Gross per 24 hour  Intake   2445 ml  Output      1 ml  Net   2444 ml    Telemetry: NSR 60's  GEN: Awake alert, no acute distress.  HEENT: Conjunctiva and lids normal, oropharynx clear with moist mucosa. Facial drooping on the left.  Neck: Supple, no elevated JVP or carotid bruits, no thyromegaly. Lungs: Clear to auscultation, nonlabored breathing at rest. Cardiac: Regular rate and rhythm, no S3 or significant systolic murmur, no pericardial rub. Abdomen: Soft, nontender, no hepatomegaly, bowel sounds present, no guarding or rebound.PEG tube in place.  Extremities: No pitting edema, distal pulses 2+.Hemaplegia on the left. Cool extremities without pallor. Skin: Warm and dry. Musculoskeletal: No kyphosis. Left sides weakness.  Neuropsychiatric: Alert and oriented x3, with some confusion of recent dates, memory deficit.  Prior Cardiac Testing/Procedures (Records from Care Everywhere documentation from [place]) 1. Echocardiogram 04/18/2014 The study was technically difficult. There is no comparison study available. The left ventricle is normal in size. There is borderline left ventricular hypertrophy. There is a moderate sized apical, posterior, and lateral wall motion abnormality with hypokinesis of the segments. The left ventricular ejection fraction is moderately reduced (40-45%). Estimation of right ventricular systolic pressure is not possible. There is heavy calcification of the aortic valve. The left atrium is mildly dilated.  There is mild [1+] aortic regurgitation present. Unable to adequately determine diastolic dysfunction. Injection of contrast documented no interatrial shunt . Left Ventricle The left ventricle is normal in size. There is borderline left ventricular hypertrophy. The left ventricular ejection fraction is moderately reduced (40-45%). Unable to adequately determine diastolic dysfunction.  2. MRI Angio W/O Contrast 04/17/2014 Mri Angio Brain Wo Contrast  04/17/2014 INDICATION: stroke. Left-sided weakness. COMPARISON: None. TECHNIQUE: MRA circle of Willis performed using time-of-flight technique without contrast. MIP imaging performed. FINDINGS: # Motion artifact is degrading some of the imaging. # Distal internal carotid arteries: There is decreased flow signal intensity in the RIGHT internal carotid artery. The LEFT internal carotid artery is patent. # Anterior cerebral arteries: Decreased flow signal intensity in the proximal RIGHT A1 segment. Rest the vessels are patent. # Middle cerebral arteries: Occlusion of the RIGHT MCA. The LEFT MCA is patent. # Posterior cerebral arteries: No gross aneurysm, significant stenosis or occlusion. # Basilar artery:  No gross aneurysm, significant stenosis or occlusion. # Distal vertebral arteries: LEFT vertebral artery is hypoplastic. RIGHT vertebral artery is patent. # Additional comments: None.   04/17/2014 IMPRESSION: Occluded RIGHT MCA and RIGHT A1 segment.  3.04/17/2014 IMPRESSION: 1. Acute infarctions in the RIGHT MCA vascular territory as described above. 2. Chronic RIGHT temporoparietal infarction. 3. Mild cerebral atrophy.   4. Ir Transcatheter Therapy Embolization   04/22/2014 IMPRESSION: 1. Occlusion the RIGHT MCA/A1 segment, TICI 0 flow. 2. Thrombectomy of the RIGHT MCA/A1 with Solitaire retrieval device, 4 x 20 mm, 5 passes. TICI 3 flow restored.   Lab Results  Basic Metabolic Panel:  Recent Labs Lab 09/24/14 1333 09/25/14 0243  NA 140 141  K 3.5 3.6    CL 105 107  CO2 27 27  GLUCOSE 100* 121*  BUN 12 14  CREATININE 1.61* 1.55*  CALCIUM 8.9 8.7*    Liver Function Tests:  Recent Labs Lab 09/24/14 1333 09/25/14 0243  AST 27 26  ALT 11* 11*  ALKPHOS 60 61  BILITOT 0.9 0.9  PROT 5.9* 6.0*  ALBUMIN 2.8* 2.8*    CBC:  Recent Labs Lab 09/24/14 1333 09/25/14 0243  WBC 6.6 9.1  NEUTROABS 3.1  --   HGB 14.0 13.7  HCT 43.7 42.4  MCV 98.0 98.4  PLT 259 270    Cardiac Enzymes:  Recent Labs Lab 09/24/14 1333 09/24/14 2003 09/25/14 0243 09/25/14 0936  TROPONINI 0.04* 0.05* 0.06* 0.05*    Radiology: Dg Chest Portable 1 View  09/24/2014   CLINICAL DATA:  Chest pain, history of COPD, atrial fibrillation, and diabetes ; history of coronary stent placement  EXAM: PORTABLE CHEST - 1 VIEW  COMPARISON:  Portable chest x-ray dated June 18, 2014  FINDINGS: The lungs are adequately inflated. There is no focal infiltrate. There is minimal subsegmental atelectasis or scarring at the right lung base. The heart is top-normal in size. The pulmonary vascularity is not engorged. The mediastinum is normal in width. No significant pleural effusion is observed. The bony thorax exhibits no acute abnormality.  IMPRESSION: COPD with minimal right basilar atelectasis or scarring. There is no CHF nor pneumonia.   Electronically Signed   By: David  Martinique M.D.   On: 09/24/2014 15:06     ECG: Atrial fib, rate of 120 bpm.    Impression and Recommendations 1. Demand ischemia with atypical chest pain: Likely related to hypotension and HR. His troponin is stable. Would continue ASA, statin, metoprolol. Replace potassium to keep it at 4.0 in cardiac patients.   2. Atrial fib: Rate is better controlled now with hydration, and continuation of Tikosyn. Will repeat EKG for intervals. Continue Eliquis.   3. CAD: Has been seen by 3 different cardiologists over the last 2 years. Now being followed by Dr. Hamilton Capri in [place] but now wishes to be established in  our practice. Stents to unknown coronaries and aorta. Will get records from [facility] in [place] for further information on geography of stents as they were placed there.   4. Hx of Embolic CVA: Right MCA infarct. Will get him out of bed to chair. He is undergoing PT at [facility], and will defer to PTH to resume in the hospital.    Signed: Phill Myron. Lawrence NP [place]  09/26/2014, 8:56 AM Co-Sign MD  The patient was seen and examined, and I agree with the assessment and plan as documented above, with modifications as noted below. 80 yr old male with complex cardiovascular history as mentioned above (  CAD with stents in unknown arteries in 2006 and 2009, persistent atrial fibrillation maintained on Eliquis and Tikosyn along with metoprolol, CVA, moderate aortic stenosis, distal aorta stenting/EVAR in 01/2013).   He had been living in [place] for 15 years and then moved to [facility]. Daughter (POA) and husband live in [place]. He suffered a stroke on 04/17/14 and is now residing at [facility] in [place]. When he saw Dr. Mauricio Po in 03/2014, coronary angiography was recommended due to a newly diagnosed cardiomyopathy, in his note reported in 35% range but echo from 03/2014 (noted above) showed EF 40-45%.  He then saw Dr. Hamilton Capri with [facility] in February and was stable.  Today he is feeling well and I spoke with Dr. Marin Comment who plans on discharging him back to [facility]. With respect to medical therapy, would continue Tikosyn, Eliquis, ASA, nitrates, metoprolol, and statin therapy. However, I would consider using an alternative to Tikosyn in the future. I will arrange for outpatient follow up in our office (as per his request for convenience sake) and also have him see on of our electrophysiologists. Will attempt to obtain records from [facility] with respect to coronary stents.  No further recommendations at this time.

## 2014-09-26 NOTE — Care Management Note (Signed)
Case Management Note  Patient Details  Name: BILLYJOE GO MRN: [id number] Date of Birth: [birth year]  Subjective/Objective:                    Action/Plan:   Expected Discharge Date:                  Expected Discharge Plan:  Skilled Nursing Facility  In-House Referral:  Clinical Social Work  Discharge planning Services  CM Consult  Post Acute Care Choice:  NA Choice offered to:  NA  DME Arranged:    DME Agency:     HH Arranged:    [place] Agency:     Status of Service:  Completed, signed off  Medicare Important Message Given:  N/A - LOS <3 / Initial given by admissions Date Medicare IM Given:    Medicare IM give by:    Date Additional Medicare IM Given:    Additional Medicare Important Message give by:     If discussed at [place] of Stay Meetings, dates discussed:    Additional Comments: Anticipate discharge back to [facility] today. CSW to arrange discharge to facility. Christinia Gully [place], RN 09/26/2014, 10:36 AM

## 2014-09-26 NOTE — Clinical Documentation Improvement (Signed)
  Documentation indicates patient's "chest pain" is "not typical for cardiac pain" and consider gastric origin. If possible, please indicate likely/possible/suspected origin of  "chest pain" to increase specificity of Primary Diagnosis.  Possible Conditions -- GERD -- Esophageal spasm -- Epigastric pain -- Chest pain atypical -- Other -- Not able to determine  Risk Factors: -- PEG feeds s/p CVA -- Diabetes -- Chronic Afib  Sign & Symptoms: -- slight rise in Troponins Treatment: -- Pepcid per PEG -- Serial enzymes  Thank you,  Ezekiel Ina ,RN Clinical Documentation Specialist:  [facility]

## 2014-09-26 NOTE — Care Management (Signed)
Important Message  Patient Details  Name: JAHSIR RAMA MRN: [id number] Date of Birth: 1934/08/12   Medicare Important Message Given:  N/A - LOS <3 / Initial given by admissions    Joylene Draft, RN 09/26/2014, 10:36 AM

## 2014-09-26 NOTE — Progress Notes (Signed)
Patient discharged to [facility], report called,and given to [facility] [facility] [employer] LPN. Vital signs stable. No c/o pain or discomfort. Transported by [facility] transporters to facility.Marland Kitchen

## 2014-09-26 NOTE — Clinical Social Work Note (Signed)
Pt d/c today back to [facility]. Facility and pt's daughter [name] aware and agreeable. D/C summary faxed. Family setting up transport with [facility], [place]

## 2014-09-26 NOTE — Discharge Summary (Signed)
Physician Discharge Summary  Alejandro Diaz ZOX:[id number] DOB: [birth year] DOA: 09/24/2014  PCP: Jani Gravel, MD  Admit date: 09/24/2014 Discharge date: 09/26/2014  Time spent: 35 minutes  Recommendations for Outpatient Follow-up:  1. Follow up with PCP in one week. 2. Follow up with cardiology as discussed.  Discharge Diagnoses:  Active Problems:   Chest pain   Diabetes   AF (atrial fibrillation)   Essential hypertension   Pressure ulcer   Discharge Condition: Same.   Diet recommendation: See recommendation below:    Recommendations/Treatment - 09/11/14 1417    Swallow Evaluation Recommendations   SLP Diet Recommendations Dysphagia 3 (Mech soft);Nectar   Liquid Administration via Cup   Medication Administration Crushed with puree   Supervision Patient able to self feed;Full supervision/cueing for compensatory strategies   Compensations Minimize environmental distractions;Slow rate;Small sips/bites;Check for pocketing;Multiple dry swallows after each bite/sip;Effortful swallow  chin tuck with trials thin   Postural Changes Seated upright at 90 degrees         Filed Weights   09/24/14 1706  Weight: 93.622 kg (206 lb 6.4 oz)    History of present illness: Patient was admitted for atypical chest pain after receiving TF on July 5th, 2016 by Dr Anastasio Champion.  As per his H and P:  "  Alejandro Diaz is a 80 year old man who lives in a skilled nursing facility since January of this year when he had a large stroke affecting the left side of his body with significant hemiplegia on that side. He presents today with lower chest/epigastric pain which came on at approximately noon today after he had been given PEG tube feeding. It was associated with dyspnea and some nausea but there was no vomiting. He was sweating to some degree but not significantly so. The pain seemed to radiate to his back. It apparently lasted approximately an hour and seems to have gone now since being in the  emergency room. On arrival to the emergency room, he was found to be somewhat hypotensive and had a tachycardia with his atrial fibrillation. He was given IV fluids and this has improved. There were no signs of any bleeding via the PEG tube. He is diabetic. He is now being admitted for further investigation and management.   Hospital Course: patient was admitted into the hospital, and his troponins were cycled.  It was slightly elevated to 0.04 to 0.06.  It was not felt that his chest pain was cardiac in etiology. He was seen in consultation with cardiology, and recommendation was to continue current tx.  He has hx of afib, and his rate was slightly elevated, but finally controlled under same medication regimen.  He was continued on his Eliquis, along with Tykosin for his CHADS VASC of 5.  He and his family expressed desires to be followed by our cardiology group here, as it will safe driving time.   With respect to his CVA, we reviewed the speech recommendation for his diet.  He has severe dysphagia, and has silent aspiration.  He and his family, however, desired for him to have pleasure eating, and they will feed him according to the SLP recommnedation as best as possible.  I reviewed that diet recommendation, as reported above.  He is stable and will follow up with PCP in one week, and with cardiology also, as he will need to have his cardiac medication adjusted.  Dr Burke Keels recommended follow up to see if Tykosin should be discontinued in the future.  I concurred.  Side effects of Tykison is numerous, including tremor of the extremities.    He is stable, and will be discharged back to SNF today. Thank you and good day,   Consultations:  Cardiology.   Discharge Exam: Filed Vitals:   09/26/14 0830  BP: 172/75  Pulse:   Temp:   Resp:     General: No chest pain. Cardiovascular: Doing well. Respiratory: No SOB.   Discharge Instructions   Discharge Instructions    Discharge instructions     Complete by:  As directed   Follow Dysphagia 3, nectar thickened liquid, with coaching to swallow.  See recommendations given by SLP.          Current Discharge Medication List    START taking these medications   Details  insulin aspart (NOVOLOG) 100 UNIT/ML injection Inject 0-15 Units into the skin 3 (three) times daily with meals. Qty: 10 mL, Refills: 11      CONTINUE these medications which have NOT CHANGED   Details  apixaban (ELIQUIS) 5 MG TABS tablet 5 mg by PEG Tube route 2 (two) times daily.    aspirin EC 81 MG tablet 81 mg by PEG Tube route daily.    budesonide-formoterol (SYMBICORT) 160-4.5 MCG/ACT inhaler Inhale 2 puffs into the lungs 2 (two) times daily.    cholecalciferol (VITAMIN D) 1000 UNITS tablet 1,000 Units by PEG Tube route daily.     dofetilide (TIKOSYN) 500 MCG capsule 500 mcg by PEG Tube route every 12 (twelve) hours.     famotidine (PEPCID) 20 MG tablet 20 mg by PEG Tube route 2 (two) times daily.    ferrous sulfate 220 (44 FE) MG/5ML solution Place 220 mg into feeding tube daily.    glipiZIDE (GLUCOTROL) 5 MG tablet 5 mg by PEG Tube route daily before breakfast.    HYDROcodone-acetaminophen (NORCO/VICODIN) 5-325 MG per tablet 1 tablet by PEG Tube route every 6 (six) hours as needed for moderate pain.    ipratropium-albuterol (DUONEB) 0.5-2.5 (3) MG/3ML SOLN Take 3 mLs by nebulization every 6 (six) hours as needed (shortness of breath/wheezing).    isosorbide dinitrate (ISORDIL) 30 MG tablet 30 mg by PEG Tube route 3 (three) times daily.    levothyroxine (SYNTHROID, LEVOTHROID) 125 MCG tablet 125 mcg by PEG Tube route daily before breakfast.    Melatonin 3 MG TABS Take 3 mg by mouth at bedtime.    metoprolol (LOPRESSOR) 100 MG tablet 100 mg by PEG Tube route 2 (two) times daily.    Nutritional Supplements (FEEDING SUPPLEMENT, JEVITY 1.5 CAL,) LIQD Place 120 mLs into feeding tube 3 (three) times daily.    paroxetine (PAXIL) 10 MG/5ML suspension 40  mg by PEG Tube route daily.    polyethylene glycol (MIRALAX / GLYCOLAX) packet 17 g by PEG Tube route daily.    pravastatin (PRAVACHOL) 40 MG tablet 40 mg by PEG Tube route at bedtime.    ranitidine (ZANTAC) 150 MG/10ML syrup Place 300 mg into feeding tube daily.    tiotropium (SPIRIVA) 18 MCG inhalation capsule Place 18 mcg into inhaler and inhale daily.    traZODone (DESYREL) 100 MG tablet 100 mg by PEG Tube route at bedtime as needed for sleep.    albuterol (PROVENTIL HFA;VENTOLIN HFA) 108 (90 BASE) MCG/ACT inhaler Inhale 1-2 puffs into the lungs every 6 (six) hours as needed for wheezing or shortness of breath. Qty: 1 Inhaler, Refills: 0      STOP taking these medications     guaifenesin (ROBITUSSIN) 100 MG/5ML syrup  nitroGLYCERIN (NITROSTAT) 0.4 MG SL tablet      promethazine (PHENERGAN) 6.25 MG/5ML syrup      sennosides-docusate sodium (SENOKOT-S) 8.6-50 MG tablet        No Known Allergies    The results of significant diagnostics from this hospitalization (including imaging, microbiology, ancillary and laboratory) are listed below for reference.    Significant Diagnostic Studies: Dg Op Swallowing Func-medicare/speech Path  09/11/2014    Objective Swallowing Evaluation:    Patient Details  Name: BILAAL LEIB MRN: [id number] Date of Birth: 1934-05-16  Today's Date: 09/11/2014 Time: No Data Recorded-No Data Recorded No Data Recorded  Past Medical History:  Past Medical History  Diagnosis Date  . Diabetes mellitus without complication   . Hypertension   . Arthritis   . COPD (chronic obstructive pulmonary disease)    Past Surgical History:  Past Surgical History  Procedure Laterality Date  . Coronary stent placement    . Cardiac surgery    . Brain surgery     HPI:  Other Pertinent Information: Mr. Esaiah Wanless is a 80 yo male resident at  [facility] facility who was referred by Dr. Jani Gravel for  MBSS. Mr. Gras sustained a stroke 04/17/2014 (right basal ganglia, chronic   right temporoparietal infarction). He has been at [facility] for rehab and  consuming a mech soft diet with NTL. Pt has PEG, but he states it is only  used for some medications.  Subjective: Pt seen upright in [place] chair for MBSS Special Tests: MBSS  Assessment / Plan / Recommendation Mr. Reali presents with mild/mod oral phase and moderate pharyngeal phase  dysphagia characterized by weak lingual manipulation and bolos propulsion  for for AP transit, piecemeal deglutition, delay in swallow initiation  with swallow trigger after spilling to pyriforms with nectar and thin and  filling valleculae with puree and solids, decreased tongue base retraction  and epiglottic deflection, decreased laryngeal closure and decreased  sensation for residuals and aspiration resulting in delayed swallow  initiation, residuals post swallow along tongue (solids) and in pharynx  (valleculae and lateral channels), penetration of thins before/during the  swallow, and silent aspiration during/after the swallow with thins. Chin  tuck was initially effective in preventing aspiration, but not effective  as study went on. Pt benefitted from cue to "swallow hard" with puree and  solids and this greatly reduced vallecular residue post swallow. Pt  without awareness of aspiration on 3/3 occurrences and required verbal  cues to cough (mostly cleared but not all). Recommend D3/mech soft diet  with nectar-thick liquids with cues for effortful swallow and pt to  swallow 2-3x per bite/sip. Consider trials of thin water after oral care  with SLP.    No flowsheet data found.   CHL IP DIET RECOMMENDATION 09/11/2014  SLP Diet Recommendations Dysphagia 3 (Mech soft);Nectar  Liquid Administration via (None)  Medication Administration Crushed with puree  Compensations Minimize environmental distractions;Slow rate;Small  sips/bites;Check for pocketing;Multiple dry swallows after each  bite/sip;Effortful swallow  Postural Changes and/or Swallow Maneuvers  (None)      Thank you,  Genene Churn, [place]  [place] 09/11/2014, 3:38 PM    CLINICAL DATA:  Dysphagia, recent pneumonia, history stroke  EXAM: MODIFIED BARIUM SWALLOW  TECHNIQUE: Different consistencies of barium were administered orally to the patient by the Speech Pathologist. Imaging of the pharynx was performed in the lateral projection.  FLUOROSCOPY TIME:  Radiation Exposure Index (as provided by the fluoroscopic device): Not provided  If the  device does not provide the exposure index:  Fluoroscopy Time:  3 minutes 30 seconds  Number of Acquired Images: None; single screen capture during fluoroscopy  COMPARISON:  None  FINDINGS: Thin liquid- with thin barium by cup, initial swallows demonstrated flash laryngeal penetration without aspiration. As additional swallows were evaluated, premature spillover of contrast to the vallecular and piriform sinuses was identified with delayed initiation of swallow. Laryngeal penetration and silent aspiration of contrast were identified on several swallows. No spontaneous cough reflex identified. Minimal vallecular residuals noted.  Nectar thick liquid- premature spillover to the vallecula. Delayed initiation. Flash laryngeal penetration occurred on a single swallow but did not recur. No aspiration or significant residuals.  Honey- not evaluated  Pure- slightly prolonged oral phase. No premature spillover or delayed initiation. No laryngeal penetration or aspiration. Vallecular residuals were noted, decreased with hard swallow.  Cracker-prolonged oral phase. Mild delayed initiation with spillover to the vallecula. No laryngeal penetration or aspiration. Vallecular residual noted.  Pure with cracker- within normal limits  Barium tablet -  not evaluated  IMPRESSION: Swallowing dysfunction particularly with thin liquids as above.  Please refer to the Speech Pathologists report for complete details and recommendations.   Electronically Signed   By: Lavonia Dana M.D.   On: 09/11/2014 13:57   Dg Chest Portable 1 View  09/24/2014   CLINICAL DATA:  Chest pain, history of COPD, atrial fibrillation, and diabetes ; history of coronary stent placement  EXAM: PORTABLE CHEST - 1 VIEW  COMPARISON:  Portable chest x-ray dated June 18, 2014  FINDINGS: The lungs are adequately inflated. There is no focal infiltrate. There is minimal subsegmental atelectasis or scarring at the right lung base. The heart is top-normal in size. The pulmonary vascularity is not engorged. The mediastinum is normal in width. No significant pleural effusion is observed. The bony thorax exhibits no acute abnormality.  IMPRESSION: COPD with minimal right basilar atelectasis or scarring. There is no CHF nor pneumonia.   Electronically Signed   By: David  Martinique M.D.   On: 09/24/2014 15:06    Microbiology: Recent Results (from the past 240 hour(s))  MRSA PCR Screening     Status: Abnormal   Collection Time: 09/24/14  6:00 PM  Result Value Ref Range Status   MRSA by PCR POSITIVE (A) NEGATIVE Final    Comment:        The GeneXpert MRSA Assay (FDA approved for NASAL specimens only), is one component of a comprehensive MRSA colonization surveillance program. It is not intended to diagnose MRSA infection nor to guide or monitor treatment for MRSA infections. RESULT CALLED TO, READ BACK BY AND VERIFIED WITH: SANTOS,K ON 09/24/14 AT 2130 BY LOY,C      Labs: Basic Metabolic Panel:  Recent Labs Lab 09/24/14 1333 09/25/14 0243  NA 140 141  K 3.5 3.6  CL 105 107  CO2 27 27  GLUCOSE 100* 121*  BUN 12 14  CREATININE 1.61* 1.55*  CALCIUM 8.9 8.7*   Liver Function Tests:  Recent Labs Lab 09/24/14 1333 09/25/14 0243  AST 27 26  ALT 11* 11*  ALKPHOS 60 61  BILITOT 0.9 0.9  PROT 5.9* 6.0*  ALBUMIN 2.8* 2.8*    Recent Labs Lab 09/24/14 1333 09/25/14 0243  WBC 6.6 9.1  NEUTROABS 3.1  --   HGB 14.0 13.7  HCT 43.7 42.4  MCV 98.0 98.4  PLT 259 270   Cardiac  Enzymes:  Recent Labs Lab 09/24/14 1333 09/24/14 2003 09/25/14 0243 09/25/14  2479  TROPONINI 0.04* 0.05* 0.06* 0.05*   CBG:  Recent Labs Lab 09/25/14 1651 09/25/14 1737 09/25/14 2100 09/26/14 0727 09/26/14 1128  GLUCAP 60* 82 81 101* 121*    Signed:  Dandy Lazaro  [facility] 09/26/2014, 12:41 PM

## 2014-09-26 NOTE — Progress Notes (Signed)
Inpatient Diabetes Program Recommendations  AACE/ADA: New Consensus Statement on Inpatient Glycemic Control (2013)  Target Ranges:  Prepandial:   less than 140 mg/dL      Peak postprandial:   less than 180 mg/dL (1-2 hours)      Critically ill patients:  140 - 180 mg/dL   Review of Glycemic Control:  Results for Alejandro Diaz, Alejandro Diaz (MRN [id number]) as of 09/26/2014 10:32  Ref. Range 09/25/2014 11:26 09/25/2014 16:51 09/25/2014 17:37 09/25/2014 21:00 09/26/2014 07:27  Glucose-Capillary Latest Ref Range: 65-99 mg/dL 133 (H) 60 (L) 82 81 101 (H)   Diabetes history: Type 2 diabetes Outpatient Diabetes medications: Glipizide 5 mg daily Current orders for Inpatient glycemic control: Glipizide 5 mg daily, Novolog moderate tid with meals and HS  Please consider holding Glipizide 5 mg daily while patient is NPO.  Also consider reducing Novolog correction to sensitive tid with meals.    Thanks, Adah Perl, RN, BC-ADM Inpatient Diabetes Coordinator Pager [phone] (8a-5p)

## 2014-09-27 DIAGNOSIS — I959 Hypotension, unspecified: Secondary | ICD-10-CM | POA: Diagnosis not present

## 2014-09-27 DIAGNOSIS — L03211 Cellulitis of face: Secondary | ICD-10-CM | POA: Diagnosis not present

## 2014-09-27 DIAGNOSIS — G8190 Hemiplegia, unspecified affecting unspecified side: Secondary | ICD-10-CM | POA: Diagnosis not present

## 2014-09-27 DIAGNOSIS — N182 Chronic kidney disease, stage 2 (mild): Secondary | ICD-10-CM | POA: Diagnosis not present

## 2014-09-27 DIAGNOSIS — L89302 Pressure ulcer of unspecified buttock, stage 2: Secondary | ICD-10-CM | POA: Diagnosis not present

## 2014-10-02 DIAGNOSIS — N182 Chronic kidney disease, stage 2 (mild): Secondary | ICD-10-CM | POA: Diagnosis not present

## 2014-10-02 DIAGNOSIS — L03211 Cellulitis of face: Secondary | ICD-10-CM | POA: Diagnosis not present

## 2014-10-02 DIAGNOSIS — D72829 Elevated white blood cell count, unspecified: Secondary | ICD-10-CM | POA: Diagnosis not present

## 2014-10-02 DIAGNOSIS — R630 Anorexia: Secondary | ICD-10-CM | POA: Diagnosis not present

## 2014-10-04 DIAGNOSIS — R630 Anorexia: Secondary | ICD-10-CM | POA: Diagnosis not present

## 2014-10-04 DIAGNOSIS — R1310 Dysphagia, unspecified: Secondary | ICD-10-CM | POA: Diagnosis not present

## 2014-10-04 DIAGNOSIS — L03211 Cellulitis of face: Secondary | ICD-10-CM | POA: Diagnosis not present

## 2014-10-04 DIAGNOSIS — D649 Anemia, unspecified: Secondary | ICD-10-CM | POA: Diagnosis not present

## 2014-10-04 DIAGNOSIS — L03113 Cellulitis of right upper limb: Secondary | ICD-10-CM | POA: Diagnosis not present

## 2014-10-05 DIAGNOSIS — L03113 Cellulitis of right upper limb: Secondary | ICD-10-CM | POA: Diagnosis not present

## 2014-10-05 DIAGNOSIS — D649 Anemia, unspecified: Secondary | ICD-10-CM | POA: Diagnosis not present

## 2014-10-05 DIAGNOSIS — R630 Anorexia: Secondary | ICD-10-CM | POA: Diagnosis not present

## 2014-10-05 DIAGNOSIS — L03211 Cellulitis of face: Secondary | ICD-10-CM | POA: Diagnosis not present

## 2014-10-05 DIAGNOSIS — R1310 Dysphagia, unspecified: Secondary | ICD-10-CM | POA: Diagnosis not present

## 2014-10-09 DIAGNOSIS — L03211 Cellulitis of face: Secondary | ICD-10-CM | POA: Diagnosis not present

## 2014-10-09 DIAGNOSIS — N182 Chronic kidney disease, stage 2 (mild): Secondary | ICD-10-CM | POA: Diagnosis not present

## 2014-10-09 DIAGNOSIS — L03113 Cellulitis of right upper limb: Secondary | ICD-10-CM | POA: Diagnosis not present

## 2014-10-09 DIAGNOSIS — R1310 Dysphagia, unspecified: Secondary | ICD-10-CM | POA: Diagnosis not present

## 2014-10-12 DIAGNOSIS — L89302 Pressure ulcer of unspecified buttock, stage 2: Secondary | ICD-10-CM | POA: Diagnosis not present

## 2014-10-12 DIAGNOSIS — L03211 Cellulitis of face: Secondary | ICD-10-CM | POA: Diagnosis not present

## 2014-10-12 DIAGNOSIS — I959 Hypotension, unspecified: Secondary | ICD-10-CM | POA: Diagnosis not present

## 2014-10-12 DIAGNOSIS — G8190 Hemiplegia, unspecified affecting unspecified side: Secondary | ICD-10-CM | POA: Diagnosis not present

## 2014-10-12 DIAGNOSIS — N182 Chronic kidney disease, stage 2 (mild): Secondary | ICD-10-CM | POA: Diagnosis not present

## 2014-10-15 ENCOUNTER — Ambulatory Visit (INDEPENDENT_AMBULATORY_CARE_PROVIDER_SITE_OTHER): Payer: Medicare Other | Admitting: Adult Health

## 2014-10-15 ENCOUNTER — Encounter: Payer: Self-pay | Admitting: Adult Health

## 2014-10-15 VITALS — BP 114/58 | HR 58 | Ht 69.0 in | Wt 210.0 lb

## 2014-10-15 DIAGNOSIS — I4891 Unspecified atrial fibrillation: Secondary | ICD-10-CM

## 2014-10-15 DIAGNOSIS — Z13220 Encounter for screening for lipoid disorders: Secondary | ICD-10-CM | POA: Diagnosis not present

## 2014-10-15 DIAGNOSIS — I10 Essential (primary) hypertension: Secondary | ICD-10-CM

## 2014-10-15 DIAGNOSIS — I350 Nonrheumatic aortic (valve) stenosis: Secondary | ICD-10-CM

## 2014-10-15 DIAGNOSIS — R739 Hyperglycemia, unspecified: Secondary | ICD-10-CM

## 2014-10-15 NOTE — Progress Notes (Signed)
Cardiology Office Note   Date:  10/15/2014   ID:  Alejandro Diaz, DOB 11-22-1934, MRN [id number]  PCP:  Jani Gravel, MD  Cardiologist:  Woodroe Chen, NP   Chief Complaint  Patient presents with  . Coronary Artery Disease  . Cardiomyopathy    Ischemic EF of 40-45%  . Atrial Fibrillation      History of Present Illness: Alejandro Diaz is a 80 y.o. male who presents for ongoing assessment and management of multiple cardiac issues.  He has a known history of CAD, with stents placed, unknown origin 2006 and 2009 in [place], [place], aortic stent through [facility], ischemic myopathy, with an EF of 4045%, atrial fibrillation, embolic CVA with left-sided hemiplegia, CHADS VASC Score of 5 on Eliquis, PEG tube in situ.  He was seen in consultation during hospitalization for chest pain, and atrial fibrillation.  The patient and family wish to be established with our practice, as they have recently moved to the area.  During hospitalization, and has not felt his chest pain was cardiac in etiology.  Was felt that he had silent aspiration despite use of PEG tube.  The patient is here on post hospitalization followup to also discuss need to continue Tikosyn therapy for atrial fibrillation.  The patient has a history of COPD.  He comes today without complaint. He has continued therapy for physical deconditioning status post CVA and [facility]. The patient is eating and drinking.  He is also being fed through his PEG tube.  He denies any recurrent chest pain or shortness of breath.   Past Medical History  Diagnosis Date  . Diabetes mellitus without complication   . Hypertension   . Arthritis   . COPD (chronic obstructive pulmonary disease)   . Cancer     melanoma  . Atrial fibrillation   . GERD (gastroesophageal reflux disease)   . CAD (coronary artery disease)     Stents to unknown arteries in 2006,2009  . CVA (cerebral infarction) 06/2351    Embolic Left sided  hemiplegia.   . Ischemic cardiomyopathy     EF of 40-45%    Past Surgical History  Procedure Laterality Date  . Coronary stent placement    . Cardiac surgery    . Brain surgery    . Back surgery    . Splenectomy       Current Outpatient Prescriptions  Medication Sig Dispense Refill  . albuterol (PROVENTIL HFA;VENTOLIN HFA) 108 (90 BASE) MCG/ACT inhaler Inhale 1-2 puffs into the lungs every 6 (six) hours as needed for wheezing or shortness of breath. (Patient not taking: Reported on 09/24/2014) 1 Inhaler 0  . apixaban (ELIQUIS) 5 MG TABS tablet 5 mg by PEG Tube route 2 (two) times daily.    Marland Kitchen aspirin EC 81 MG tablet 81 mg by PEG Tube route daily.    . budesonide-formoterol (SYMBICORT) 160-4.5 MCG/ACT inhaler Inhale 2 puffs into the lungs 2 (two) times daily.    . cholecalciferol (VITAMIN D) 1000 UNITS tablet 1,000 Units by PEG Tube route daily.     Marland Kitchen dofetilide (TIKOSYN) 500 MCG capsule 500 mcg by PEG Tube route every 12 (twelve) hours.     . famotidine (PEPCID) 20 MG tablet 20 mg by PEG Tube route 2 (two) times daily.    . ferrous sulfate 220 (44 FE) MG/5ML solution Place 220 mg into feeding tube daily.    Marland Kitchen glipiZIDE (GLUCOTROL) 5 MG tablet 5 mg by PEG Tube route daily  before breakfast.    . HYDROcodone-acetaminophen (NORCO/VICODIN) 5-325 MG per tablet 1 tablet by PEG Tube route every 6 (six) hours as needed for moderate pain.    Marland Kitchen insulin aspart (NOVOLOG) 100 UNIT/ML injection Inject 0-15 Units into the skin 3 (three) times daily with meals. 10 mL 11  . ipratropium-albuterol (DUONEB) 0.5-2.5 (3) MG/3ML SOLN Take 3 mLs by nebulization every 6 (six) hours as needed (shortness of breath/wheezing).    . isosorbide dinitrate (ISORDIL) 30 MG tablet 30 mg by PEG Tube route 3 (three) times daily.    Marland Kitchen levothyroxine (SYNTHROID, LEVOTHROID) 125 MCG tablet 125 mcg by PEG Tube route daily before breakfast.    . Melatonin 3 MG TABS Take 3 mg by mouth at bedtime.    . metoprolol (LOPRESSOR) 100 MG  tablet 100 mg by PEG Tube route 2 (two) times daily.    . Nutritional Supplements (FEEDING SUPPLEMENT, JEVITY 1.5 CAL,) LIQD Place 120 mLs into feeding tube 3 (three) times daily.    . paroxetine (PAXIL) 10 MG/5ML suspension 40 mg by PEG Tube route daily.    . polyethylene glycol (MIRALAX / GLYCOLAX) packet 17 g by PEG Tube route daily.    . pravastatin (PRAVACHOL) 40 MG tablet 40 mg by PEG Tube route at bedtime.    . ranitidine (ZANTAC) 150 MG/10ML syrup Place 300 mg into feeding tube daily.    Marland Kitchen tiotropium (SPIRIVA) 18 MCG inhalation capsule Place 18 mcg into inhaler and inhale daily.    . traZODone (DESYREL) 100 MG tablet 100 mg by PEG Tube route at bedtime as needed for sleep.     No current facility-administered medications for this visit.    Allergies:   Review of patient's allergies indicates no known allergies.    Social History:  The patient  reports that he has quit smoking. He has never used smokeless tobacco. He reports that he does not drink alcohol or use illicit drugs.   Family History:  The patient's family history includes Heart attack in his father.    ROS: .   All other systems are reviewed and negative.Unless otherwise mentioned in H&P above.   PHYSICAL EXAM: VS:  There were no vitals taken for this visit. , BMI There is no weight on file to calculate BMI. GEN: Well nourished, well developed, in no acute distress HEENT: normal Neck: no JVD, carotid bruits, or masses Cardiac: RRR; no murmurs, rubs, or gallops,no edema  Respiratory:  clear to auscultation bilaterally, normal work of breathing GI: soft, nontender, nondistended, + BS MS: no deformity or atrophyleft-sided hemiparesis with arm in a splint, sitting in a wheelchair. Skin: warm and dry, no rash Neuro:  Strength and sensation are diminished on the left and bilateral lower extremities Psych: euthymic mood, full affect   Recent Labs: 09/25/2014: ALT 11*; BUN 14; Creatinine, Ser 1.55*; Hemoglobin 13.7;  Platelets 270; Potassium 3.6; Sodium 141    Lipid Panel No results found for: CHOL, TRIG, HDL, CHOLHDL, VLDL, LDLCALC, LDLDIRECT    Wt Readings from Last 3 Encounters:  09/24/14 206 lb 6.4 oz (93.622 kg)      Other studies Reviewed: Additional studies/ records that were reviewed today include:none. Review of the above records demonstrates: N/A   ASSESSMENT AND PLAN:  1. Coronary artery disease: Patient denies recurrent chest discomfort.  He is sedentary, but is being followed at [facility].  The patient will be continued on his current medication regimen which includes statin therapy, metoprolol 100 mg twice a day, isosorbide, dinitrate  30 mg 3 times a day along with aspirin.  2. Atrial fibrillation: he remains on Tikosyn therapy.  He will be referred for EP for evaluation at the request of Dr. Bronson Ing..  Need to continue.  He will continue on  Apixaban, 5 mg twice a day and he has PEG tube.CHADS VASC Core of 5.   3. S/P CVA: left-sided hemiparesis.  He continues with physical therapy at an inpatient skilled nursing facility.   Current medicines are reviewed at length with the patient today.    No orders of the defined types were placed in this encounter.     Disposition:   FU with 3 months.  Signed, Jory Sims, NP  10/15/2014 7:15 AM    [facility] [address], [place], [place] [address] Phone: [phone]; Fax: [phone]

## 2014-10-15 NOTE — Progress Notes (Deleted)
Name: Alejandro Diaz    DOB: 23-Jun-1934  Age: 80 y.o.  MR#: [id number]       PCP:  Jani Gravel, MD      Insurance: Payor: MEDICARE / Plan: MEDICARE PART A AND B / Product Type: *No Product type* /   CC:    Chief Complaint  Patient presents with  . Coronary Artery Disease  . Cardiomyopathy    Ischemic EF of 40-45%  . Atrial Fibrillation    VS Filed Vitals:   10/15/14 1329  BP: 114/58  Pulse: 58  Height: 5\' 9"  (1.753 m)  Weight: 210 lb (95.255 kg)  SpO2: 99%    Weights Current Weight  10/15/14 210 lb (95.255 kg)  09/24/14 206 lb 6.4 oz (93.622 kg)    Blood Pressure  BP Readings from Last 3 Encounters:  10/15/14 114/58  09/26/14 140/60  06/18/14 136/66     Admit date:  (Not on file) Last encounter with RMR:  Visit date not found   Allergy Review of patient's allergies indicates no known allergies.  Current Outpatient Prescriptions  Medication Sig Dispense Refill  . albuterol (PROVENTIL HFA;VENTOLIN HFA) 108 (90 BASE) MCG/ACT inhaler Inhale 1-2 puffs into the lungs every 6 (six) hours as needed for wheezing or shortness of breath. 1 Inhaler 0  . apixaban (ELIQUIS) 5 MG TABS tablet 5 mg by PEG Tube route 2 (two) times daily.    Marland Kitchen aspirin EC 81 MG tablet 81 mg by PEG Tube route daily.    . budesonide-formoterol (SYMBICORT) 160-4.5 MCG/ACT inhaler Inhale 2 puffs into the lungs 2 (two) times daily.    . cholecalciferol (VITAMIN D) 1000 UNITS tablet 1,000 Units by PEG Tube route daily.     Marland Kitchen dofetilide (TIKOSYN) 500 MCG capsule 500 mcg by PEG Tube route every 12 (twelve) hours.     . famotidine (PEPCID) 20 MG tablet 20 mg by PEG Tube route 2 (two) times daily.    . ferrous sulfate 220 (44 FE) MG/5ML solution Place 220 mg into feeding tube daily.    Marland Kitchen glipiZIDE (GLUCOTROL) 5 MG tablet 5 mg by PEG Tube route daily before breakfast.    . HYDROcodone-acetaminophen (NORCO/VICODIN) 5-325 MG per tablet 1 tablet by PEG Tube route every 6 (six) hours as needed for moderate pain.    Marland Kitchen  insulin aspart (NOVOLOG) 100 UNIT/ML injection Inject 0-15 Units into the skin 3 (three) times daily with meals. 10 mL 11  . ipratropium-albuterol (DUONEB) 0.5-2.5 (3) MG/3ML SOLN Take 3 mLs by nebulization every 6 (six) hours as needed (shortness of breath/wheezing).    . isosorbide dinitrate (ISORDIL) 30 MG tablet 30 mg by PEG Tube route 3 (three) times daily.    Marland Kitchen levothyroxine (SYNTHROID, LEVOTHROID) 125 MCG tablet 125 mcg by PEG Tube route daily before breakfast.    . Melatonin 3 MG TABS Take 3 mg by mouth at bedtime.    . metoprolol (LOPRESSOR) 100 MG tablet 100 mg by PEG Tube route 2 (two) times daily.    . Nutritional Supplements (FEEDING SUPPLEMENT, JEVITY 1.5 CAL,) LIQD Place 120 mLs into feeding tube 3 (three) times daily.    . paroxetine (PAXIL) 10 MG/5ML suspension 40 mg by PEG Tube route daily.    . polyethylene glycol (MIRALAX / GLYCOLAX) packet 17 g by PEG Tube route daily.    . pravastatin (PRAVACHOL) 40 MG tablet 40 mg by PEG Tube route at bedtime.    . ranitidine (ZANTAC) 150 MG/10ML syrup Place 300 mg into  feeding tube daily.    Marland Kitchen tiotropium (SPIRIVA) 18 MCG inhalation capsule Place 18 mcg into inhaler and inhale daily.    . traZODone (DESYREL) 100 MG tablet 100 mg by PEG Tube route at bedtime as needed for sleep.     No current facility-administered medications for this visit.    Discontinued Meds:   There are no discontinued medications.  Patient Active Problem List   Diagnosis Date Noted  . Pressure ulcer 09/25/2014  . Chest pain 09/24/2014  . Diabetes 09/24/2014  . AF (atrial fibrillation) 09/24/2014  . Essential hypertension 09/24/2014    LABS    Component Value Date/Time   NA 141 09/25/2014 0243   NA 140 09/24/2014 1333   NA 140 06/16/2013 0740   K 3.6 09/25/2014 0243   K 3.5 09/24/2014 1333   K 4.6 06/16/2013 0740   CL 107 09/25/2014 0243   CL 105 09/24/2014 1333   CL 101 06/16/2013 0740   CO2 27 09/25/2014 0243   CO2 27 09/24/2014 1333   CO2 26  06/16/2013 0740   GLUCOSE 121* 09/25/2014 0243   GLUCOSE 100* 09/24/2014 1333   GLUCOSE 124* 06/16/2013 0740   BUN 14 09/25/2014 0243   BUN 12 09/24/2014 1333   BUN 19 06/16/2013 0740   CREATININE 1.55* 09/25/2014 0243   CREATININE 1.61* 09/24/2014 1333   CREATININE 1.18 06/16/2013 0740   CALCIUM 8.7* 09/25/2014 0243   CALCIUM 8.9 09/24/2014 1333   CALCIUM 9.4 06/16/2013 0740   GFRNONAA 41* 09/25/2014 0243   GFRNONAA 39* 09/24/2014 1333   GFRNONAA 58* 06/16/2013 0740   GFRAA 48* 09/25/2014 0243   GFRAA 46* 09/24/2014 1333   GFRAA 67* 06/16/2013 0740   CMP     Component Value Date/Time   NA 141 09/25/2014 0243   K 3.6 09/25/2014 0243   CL 107 09/25/2014 0243   CO2 27 09/25/2014 0243   GLUCOSE 121* 09/25/2014 0243   BUN 14 09/25/2014 0243   CREATININE 1.55* 09/25/2014 0243   CALCIUM 8.7* 09/25/2014 0243   PROT 6.0* 09/25/2014 0243   ALBUMIN 2.8* 09/25/2014 0243   AST 26 09/25/2014 0243   ALT 11* 09/25/2014 0243   ALKPHOS 61 09/25/2014 0243   BILITOT 0.9 09/25/2014 0243   GFRNONAA 41* 09/25/2014 0243   GFRAA 48* 09/25/2014 0243       Component Value Date/Time   WBC 9.1 09/25/2014 0243   WBC 6.6 09/24/2014 1333   WBC 12.6* 06/16/2013 0740   HGB 13.7 09/25/2014 0243   HGB 14.0 09/24/2014 1333   HGB 13.2 06/16/2013 0740   HCT 42.4 09/25/2014 0243   HCT 43.7 09/24/2014 1333   HCT 41.1 06/16/2013 0740   MCV 98.4 09/25/2014 0243   MCV 98.0 09/24/2014 1333   MCV 86.5 06/16/2013 0740    Lipid Panel  No results found for: CHOL, TRIG, HDL, CHOLHDL, VLDL, LDLCALC, LDLDIRECT  ABG No results found for: PHART, PCO2ART, PO2ART, HCO3, TCO2, ACIDBASEDEF, O2SAT   No results found for: TSH BNP (last 3 results) No results for input(s): BNP in the last 8760 hours.  ProBNP (last 3 results) No results for input(s): PROBNP in the last 8760 hours.  Cardiac Panel (last 3 results) No results for input(s): CKTOTAL, CKMB, TROPONINI, RELINDX in the last 72 hours.   Iron/TIBC/Ferritin/ %Sat No results found for: IRON, TIBC, FERRITIN, IRONPCTSAT   EKG Orders placed or performed during the hospital encounter of 09/24/14  . EKG 12-Lead  . EKG 12-Lead  . EKG 12-Lead  .  EKG 12-Lead  . EKG 12-Lead  . EKG 12-Lead  . EKG     Prior Assessment and Plan Problem List as of 10/15/2014      Cardiovascular and Mediastinum   AF (atrial fibrillation)   Essential hypertension     Endocrine   Diabetes     Musculoskeletal and Integument   Pressure ulcer     Other   Chest pain       Imaging: Dg Chest Portable 1 View  09/24/2014   CLINICAL DATA:  Chest pain, history of COPD, atrial fibrillation, and diabetes ; history of coronary stent placement  EXAM: PORTABLE CHEST - 1 VIEW  COMPARISON:  Portable chest x-ray dated June 18, 2014  FINDINGS: The lungs are adequately inflated. There is no focal infiltrate. There is minimal subsegmental atelectasis or scarring at the right lung base. The heart is top-normal in size. The pulmonary vascularity is not engorged. The mediastinum is normal in width. No significant pleural effusion is observed. The bony thorax exhibits no acute abnormality.  IMPRESSION: COPD with minimal right basilar atelectasis or scarring. There is no CHF nor pneumonia.   Electronically Signed   By: David  Martinique M.D.   On: 09/24/2014 15:06

## 2014-10-15 NOTE — Patient Instructions (Signed)
Your physician wants you to follow-up in: [place]. Bronson Ing. You will receive a reminder letter in the mail two months in advance. If you don't receive a letter, please call our office to schedule the follow-up appointment.  You have been referred to Dr. Lovena Le.  Your physician recommends that you continue on your current medications as directed. Please refer to the Current Medication list given to you today.  Thank you for choosing [facility]!

## 2014-10-16 DIAGNOSIS — L03211 Cellulitis of face: Secondary | ICD-10-CM | POA: Diagnosis not present

## 2014-10-16 DIAGNOSIS — D72829 Elevated white blood cell count, unspecified: Secondary | ICD-10-CM | POA: Diagnosis not present

## 2014-10-16 DIAGNOSIS — F329 Major depressive disorder, single episode, unspecified: Secondary | ICD-10-CM | POA: Diagnosis not present

## 2014-10-16 DIAGNOSIS — I6350 Cerebral infarction due to unspecified occlusion or stenosis of unspecified cerebral artery: Secondary | ICD-10-CM | POA: Diagnosis not present

## 2014-10-16 DIAGNOSIS — E119 Type 2 diabetes mellitus without complications: Secondary | ICD-10-CM | POA: Diagnosis not present

## 2014-10-16 DIAGNOSIS — G4700 Insomnia, unspecified: Secondary | ICD-10-CM | POA: Diagnosis not present

## 2014-10-16 DIAGNOSIS — N182 Chronic kidney disease, stage 2 (mild): Secondary | ICD-10-CM | POA: Diagnosis not present

## 2014-10-17 DIAGNOSIS — D72829 Elevated white blood cell count, unspecified: Secondary | ICD-10-CM | POA: Diagnosis not present

## 2014-10-17 DIAGNOSIS — N182 Chronic kidney disease, stage 2 (mild): Secondary | ICD-10-CM | POA: Diagnosis not present

## 2014-10-17 DIAGNOSIS — I6350 Cerebral infarction due to unspecified occlusion or stenosis of unspecified cerebral artery: Secondary | ICD-10-CM | POA: Diagnosis not present

## 2014-10-17 DIAGNOSIS — G4700 Insomnia, unspecified: Secondary | ICD-10-CM | POA: Diagnosis not present

## 2014-10-17 DIAGNOSIS — E119 Type 2 diabetes mellitus without complications: Secondary | ICD-10-CM | POA: Diagnosis not present

## 2014-10-17 DIAGNOSIS — L03211 Cellulitis of face: Secondary | ICD-10-CM | POA: Diagnosis not present

## 2014-10-17 DIAGNOSIS — F329 Major depressive disorder, single episode, unspecified: Secondary | ICD-10-CM | POA: Diagnosis not present

## 2014-11-01 DIAGNOSIS — W07XXXA Fall from chair, initial encounter: Secondary | ICD-10-CM | POA: Diagnosis not present

## 2014-11-01 DIAGNOSIS — F329 Major depressive disorder, single episode, unspecified: Secondary | ICD-10-CM | POA: Diagnosis not present

## 2014-11-01 DIAGNOSIS — E119 Type 2 diabetes mellitus without complications: Secondary | ICD-10-CM | POA: Diagnosis not present

## 2014-11-01 DIAGNOSIS — R1310 Dysphagia, unspecified: Secondary | ICD-10-CM | POA: Diagnosis not present

## 2014-11-06 DIAGNOSIS — I4891 Unspecified atrial fibrillation: Secondary | ICD-10-CM | POA: Diagnosis not present

## 2014-11-06 DIAGNOSIS — E119 Type 2 diabetes mellitus without complications: Secondary | ICD-10-CM | POA: Diagnosis not present

## 2014-11-06 DIAGNOSIS — E162 Hypoglycemia, unspecified: Secondary | ICD-10-CM | POA: Diagnosis not present

## 2014-11-06 DIAGNOSIS — F329 Major depressive disorder, single episode, unspecified: Secondary | ICD-10-CM | POA: Diagnosis not present

## 2014-11-21 ENCOUNTER — Ambulatory Visit (INDEPENDENT_AMBULATORY_CARE_PROVIDER_SITE_OTHER): Payer: Medicare Other | Admitting: Internal Medicine

## 2014-11-21 ENCOUNTER — Encounter: Payer: Self-pay | Admitting: Internal Medicine

## 2014-11-21 VITALS — BP 112/58 | HR 51 | Ht 69.0 in | Wt 211.0 lb

## 2014-11-21 DIAGNOSIS — I10 Essential (primary) hypertension: Secondary | ICD-10-CM | POA: Diagnosis not present

## 2014-11-21 DIAGNOSIS — R2681 Unsteadiness on feet: Secondary | ICD-10-CM | POA: Diagnosis not present

## 2014-11-21 DIAGNOSIS — I4891 Unspecified atrial fibrillation: Secondary | ICD-10-CM

## 2014-11-21 DIAGNOSIS — R278 Other lack of coordination: Secondary | ICD-10-CM | POA: Diagnosis not present

## 2014-11-21 DIAGNOSIS — R079 Chest pain, unspecified: Secondary | ICD-10-CM | POA: Diagnosis not present

## 2014-11-21 DIAGNOSIS — M6281 Muscle weakness (generalized): Secondary | ICD-10-CM | POA: Diagnosis not present

## 2014-11-21 DIAGNOSIS — R1312 Dysphagia, oropharyngeal phase: Secondary | ICD-10-CM | POA: Diagnosis not present

## 2014-11-21 DIAGNOSIS — R262 Difficulty in walking, not elsewhere classified: Secondary | ICD-10-CM | POA: Diagnosis not present

## 2014-11-21 DIAGNOSIS — Z7901 Long term (current) use of anticoagulants: Secondary | ICD-10-CM | POA: Insufficient documentation

## 2014-11-21 DIAGNOSIS — I69354 Hemiplegia and hemiparesis following cerebral infarction affecting left non-dominant side: Secondary | ICD-10-CM | POA: Diagnosis not present

## 2014-11-21 MED ORDER — DOFETILIDE 250 MCG PO CAPS: ORAL_CAPSULE | ORAL | Status: DC

## 2014-11-21 NOTE — Progress Notes (Signed)
HPI Alejandro Diaz is referred today by Dr. Jacinta Shoe for ongoing evaluation of atrial fibrillation and chronic Tikosyn therapy. He is a pleasant widowed man with a h/o atrial fib and stroke, who has moved from [employer] to [facility]. He cannot swallow well and has left sided weakness. He has a mild expressive aphasia. The patient was initially prescribed 500 q12 of Tikosyn. He has not had syncope or any pro-arrhythmia. His creatinine clearance is around 50. He denies palpitations. No Known Allergies   Current Outpatient Prescriptions  Medication Sig Dispense Refill  . albuterol (PROVENTIL HFA;VENTOLIN HFA) 108 (90 BASE) MCG/ACT inhaler Inhale 1-2 puffs into the lungs every 6 (six) hours as needed for wheezing or shortness of breath. 1 Inhaler 0  . apixaban (ELIQUIS) 5 MG TABS tablet 5 mg by PEG Tube route 2 (two) times daily.    Marland Kitchen aspirin EC 81 MG tablet 81 mg by PEG Tube route daily.    . budesonide-formoterol (SYMBICORT) 160-4.5 MCG/ACT inhaler Inhale 2 puffs into the lungs 2 (two) times daily.    . cholecalciferol (VITAMIN D) 1000 UNITS tablet 1,000 Units by PEG Tube route daily.     Marland Kitchen dofetilide (TIKOSYN) 500 MCG capsule 500 mcg by PEG Tube route every 12 (twelve) hours.     . famotidine (PEPCID) 20 MG tablet 20 mg by PEG Tube route 2 (two) times daily.    . ferrous sulfate 220 (44 FE) MG/5ML solution Place 220 mg into feeding tube daily.    Marland Kitchen glipiZIDE (GLUCOTROL) 5 MG tablet 5 mg by PEG Tube route daily before breakfast.    . HYDROcodone-acetaminophen (NORCO/VICODIN) 5-325 MG per tablet 1 tablet by PEG Tube route every 6 (six) hours as needed for moderate pain.    Marland Kitchen insulin aspart (NOVOLOG) 100 UNIT/ML injection Inject 0-15 Units into the skin 3 (three) times daily with meals. 10 mL 11  . ipratropium-albuterol (DUONEB) 0.5-2.5 (3) MG/3ML SOLN Take 3 mLs by nebulization every 6 (six) hours as needed (shortness of breath/wheezing).    . isosorbide dinitrate (ISORDIL) 30 MG  tablet 30 mg by PEG Tube route 3 (three) times daily.    Marland Kitchen levothyroxine (SYNTHROID, LEVOTHROID) 125 MCG tablet 125 mcg by PEG Tube route daily before breakfast.    . Melatonin 3 MG TABS Take 3 mg by mouth at bedtime.    . metoprolol (LOPRESSOR) 100 MG tablet 100 mg by PEG Tube route 2 (two) times daily.    . Nutritional Supplements (FEEDING SUPPLEMENT, JEVITY 1.5 CAL,) LIQD Place 120 mLs into feeding tube 3 (three) times daily.    . paroxetine (PAXIL) 10 MG/5ML suspension 40 mg by PEG Tube route daily.    . polyethylene glycol (MIRALAX / GLYCOLAX) packet 17 g by PEG Tube route daily.    . pravastatin (PRAVACHOL) 40 MG tablet 40 mg by PEG Tube route at bedtime.    . ranitidine (ZANTAC) 150 MG/10ML syrup Place 300 mg into feeding tube daily.    Marland Kitchen tiotropium (SPIRIVA) 18 MCG inhalation capsule Place 18 mcg into inhaler and inhale daily.    . traZODone (DESYREL) 100 MG tablet 100 mg by PEG Tube route at bedtime as needed for sleep.     No current facility-administered medications for this visit.     Past Medical History  Diagnosis Date  . Diabetes mellitus without complication   . Hypertension   . Arthritis   . COPD (chronic obstructive pulmonary disease)   . Cancer  melanoma  . Atrial fibrillation   . GERD (gastroesophageal reflux disease)   . CAD (coronary artery disease)     Stents to unknown arteries in 2006,2009  . CVA (cerebral infarction) 11/7586    Embolic Left sided hemiplegia.   . Ischemic cardiomyopathy     EF of 40-45%    ROS:   All systems reviewed and negative except as noted in the HPI.   Past Surgical History  Procedure Laterality Date  . Coronary stent placement    . Cardiac surgery    . Brain surgery    . Back surgery    . Splenectomy       Family History  Problem Relation Age of Onset  . Heart attack Father      Social History   Social History  . Marital Status: Married    Spouse Name: N/A  . Number of Children: N/A  . Years of Education:  N/A   Occupational History  . Not on file.   Social History Main Topics  . Smoking status: Former Smoker    Quit date: 03/22/2010  . Smokeless tobacco: Never Used  . Alcohol Use: No  . Drug Use: No  . Sexual Activity: Not on file   Other Topics Concern  . Not on file   Social History Narrative     BP 112/58 mmHg  Pulse 51  Ht 5\' 9"  (1.753 m)  Wt 211 lb (95.709 kg)  BMI 31.15 kg/m2  SpO2 90%  Physical Exam:  Chronically ill appearing 80 yo man, NAD HEENT: Unremarkable Neck:  6 cm JVD, no thyromegally Lymphatics:  No adenopathy Back:  No CVA tenderness Lungs:  Clear with no wheezes HEART:  Regular rate rhythm, no murmurs, no rubs, no clicks Abd:  soft, positive bowel sounds, no organomegally, no rebound, no guarding Ext:  2 plus pulses, no edema, no cyanosis, no clubbing Skin:  No rashes no nodules Neuro:  CN II through XII intact, motor grossly intact  EKG - nsr with PVC's and prolonged QT  Assess/Plan:

## 2014-11-21 NOTE — Patient Instructions (Signed)
Your physician wants you to follow-up in: 6 months with Dr. Lovena Le. You will receive a reminder letter in the mail two months in advance. If you don't receive a letter, please call our office to schedule the follow-up appointment.  Your physician has recommended you make the following change in your medication:   Decrease Tikosyn to 250 mg Two Times Daily  Thank you for choosing [facility]!

## 2014-11-21 NOTE — Assessment & Plan Note (Signed)
He will continue systemic anti-coagulation with Eliquis. No bleeding.

## 2014-11-21 NOTE — Assessment & Plan Note (Signed)
His blood pressure is well controlled. No change in medical therapy. 

## 2014-11-21 NOTE — Assessment & Plan Note (Signed)
He is maintaining NSR. He has tolerated Tikosyn well but his QT is too long. Will reduce dose from 500 to 250 q 12.

## 2014-11-22 DIAGNOSIS — I10 Essential (primary) hypertension: Secondary | ICD-10-CM | POA: Diagnosis not present

## 2014-11-22 DIAGNOSIS — R278 Other lack of coordination: Secondary | ICD-10-CM | POA: Diagnosis not present

## 2014-11-22 DIAGNOSIS — I639 Cerebral infarction, unspecified: Secondary | ICD-10-CM | POA: Diagnosis not present

## 2014-11-22 DIAGNOSIS — N182 Chronic kidney disease, stage 2 (mild): Secondary | ICD-10-CM | POA: Diagnosis not present

## 2014-11-22 DIAGNOSIS — I69354 Hemiplegia and hemiparesis following cerebral infarction affecting left non-dominant side: Secondary | ICD-10-CM | POA: Diagnosis not present

## 2014-11-22 DIAGNOSIS — R2681 Unsteadiness on feet: Secondary | ICD-10-CM | POA: Diagnosis not present

## 2014-11-22 DIAGNOSIS — M6281 Muscle weakness (generalized): Secondary | ICD-10-CM | POA: Diagnosis not present

## 2014-11-22 DIAGNOSIS — G8190 Hemiplegia, unspecified affecting unspecified side: Secondary | ICD-10-CM | POA: Diagnosis not present

## 2014-11-22 DIAGNOSIS — Z79899 Other long term (current) drug therapy: Secondary | ICD-10-CM | POA: Diagnosis not present

## 2014-11-22 DIAGNOSIS — I69322 Dysarthria following cerebral infarction: Secondary | ICD-10-CM | POA: Diagnosis not present

## 2014-11-22 DIAGNOSIS — D649 Anemia, unspecified: Secondary | ICD-10-CM | POA: Diagnosis not present

## 2014-11-22 DIAGNOSIS — R079 Chest pain, unspecified: Secondary | ICD-10-CM | POA: Diagnosis not present

## 2014-11-22 DIAGNOSIS — R262 Difficulty in walking, not elsewhere classified: Secondary | ICD-10-CM | POA: Diagnosis not present

## 2014-11-25 DIAGNOSIS — R079 Chest pain, unspecified: Secondary | ICD-10-CM | POA: Diagnosis not present

## 2014-11-25 DIAGNOSIS — R278 Other lack of coordination: Secondary | ICD-10-CM | POA: Diagnosis not present

## 2014-11-25 DIAGNOSIS — M6281 Muscle weakness (generalized): Secondary | ICD-10-CM | POA: Diagnosis not present

## 2014-11-25 DIAGNOSIS — R262 Difficulty in walking, not elsewhere classified: Secondary | ICD-10-CM | POA: Diagnosis not present

## 2014-11-25 DIAGNOSIS — R2681 Unsteadiness on feet: Secondary | ICD-10-CM | POA: Diagnosis not present

## 2014-11-25 DIAGNOSIS — I69354 Hemiplegia and hemiparesis following cerebral infarction affecting left non-dominant side: Secondary | ICD-10-CM | POA: Diagnosis not present

## 2014-11-26 DIAGNOSIS — R262 Difficulty in walking, not elsewhere classified: Secondary | ICD-10-CM | POA: Diagnosis not present

## 2014-11-26 DIAGNOSIS — R2681 Unsteadiness on feet: Secondary | ICD-10-CM | POA: Diagnosis not present

## 2014-11-26 DIAGNOSIS — I69354 Hemiplegia and hemiparesis following cerebral infarction affecting left non-dominant side: Secondary | ICD-10-CM | POA: Diagnosis not present

## 2014-11-26 DIAGNOSIS — R278 Other lack of coordination: Secondary | ICD-10-CM | POA: Diagnosis not present

## 2014-11-26 DIAGNOSIS — R079 Chest pain, unspecified: Secondary | ICD-10-CM | POA: Diagnosis not present

## 2014-11-26 DIAGNOSIS — M6281 Muscle weakness (generalized): Secondary | ICD-10-CM | POA: Diagnosis not present

## 2014-11-27 DIAGNOSIS — D72829 Elevated white blood cell count, unspecified: Secondary | ICD-10-CM | POA: Diagnosis not present

## 2014-11-27 DIAGNOSIS — Z79899 Other long term (current) drug therapy: Secondary | ICD-10-CM | POA: Diagnosis not present

## 2014-11-27 DIAGNOSIS — R1310 Dysphagia, unspecified: Secondary | ICD-10-CM | POA: Diagnosis not present

## 2014-11-27 DIAGNOSIS — R262 Difficulty in walking, not elsewhere classified: Secondary | ICD-10-CM | POA: Diagnosis not present

## 2014-11-27 DIAGNOSIS — R079 Chest pain, unspecified: Secondary | ICD-10-CM | POA: Diagnosis not present

## 2014-11-27 DIAGNOSIS — R2681 Unsteadiness on feet: Secondary | ICD-10-CM | POA: Diagnosis not present

## 2014-11-27 DIAGNOSIS — M6281 Muscle weakness (generalized): Secondary | ICD-10-CM | POA: Diagnosis not present

## 2014-11-27 DIAGNOSIS — I4891 Unspecified atrial fibrillation: Secondary | ICD-10-CM | POA: Diagnosis not present

## 2014-11-27 DIAGNOSIS — I69354 Hemiplegia and hemiparesis following cerebral infarction affecting left non-dominant side: Secondary | ICD-10-CM | POA: Diagnosis not present

## 2014-11-27 DIAGNOSIS — R278 Other lack of coordination: Secondary | ICD-10-CM | POA: Diagnosis not present

## 2014-11-27 DIAGNOSIS — N182 Chronic kidney disease, stage 2 (mild): Secondary | ICD-10-CM | POA: Diagnosis not present

## 2014-11-27 DIAGNOSIS — D649 Anemia, unspecified: Secondary | ICD-10-CM | POA: Diagnosis not present

## 2014-11-28 DIAGNOSIS — R278 Other lack of coordination: Secondary | ICD-10-CM | POA: Diagnosis not present

## 2014-11-28 DIAGNOSIS — R262 Difficulty in walking, not elsewhere classified: Secondary | ICD-10-CM | POA: Diagnosis not present

## 2014-11-28 DIAGNOSIS — M6281 Muscle weakness (generalized): Secondary | ICD-10-CM | POA: Diagnosis not present

## 2014-11-28 DIAGNOSIS — I69354 Hemiplegia and hemiparesis following cerebral infarction affecting left non-dominant side: Secondary | ICD-10-CM | POA: Diagnosis not present

## 2014-11-28 DIAGNOSIS — R2681 Unsteadiness on feet: Secondary | ICD-10-CM | POA: Diagnosis not present

## 2014-11-28 DIAGNOSIS — R079 Chest pain, unspecified: Secondary | ICD-10-CM | POA: Diagnosis not present

## 2014-11-29 DIAGNOSIS — M6281 Muscle weakness (generalized): Secondary | ICD-10-CM | POA: Diagnosis not present

## 2014-11-29 DIAGNOSIS — R079 Chest pain, unspecified: Secondary | ICD-10-CM | POA: Diagnosis not present

## 2014-11-29 DIAGNOSIS — I69354 Hemiplegia and hemiparesis following cerebral infarction affecting left non-dominant side: Secondary | ICD-10-CM | POA: Diagnosis not present

## 2014-11-29 DIAGNOSIS — R2681 Unsteadiness on feet: Secondary | ICD-10-CM | POA: Diagnosis not present

## 2014-11-29 DIAGNOSIS — R262 Difficulty in walking, not elsewhere classified: Secondary | ICD-10-CM | POA: Diagnosis not present

## 2014-11-29 DIAGNOSIS — R278 Other lack of coordination: Secondary | ICD-10-CM | POA: Diagnosis not present

## 2014-12-02 DIAGNOSIS — M6281 Muscle weakness (generalized): Secondary | ICD-10-CM | POA: Diagnosis not present

## 2014-12-02 DIAGNOSIS — I69354 Hemiplegia and hemiparesis following cerebral infarction affecting left non-dominant side: Secondary | ICD-10-CM | POA: Diagnosis not present

## 2014-12-02 DIAGNOSIS — R278 Other lack of coordination: Secondary | ICD-10-CM | POA: Diagnosis not present

## 2014-12-02 DIAGNOSIS — R079 Chest pain, unspecified: Secondary | ICD-10-CM | POA: Diagnosis not present

## 2014-12-02 DIAGNOSIS — R262 Difficulty in walking, not elsewhere classified: Secondary | ICD-10-CM | POA: Diagnosis not present

## 2014-12-02 DIAGNOSIS — R2681 Unsteadiness on feet: Secondary | ICD-10-CM | POA: Diagnosis not present

## 2014-12-03 DIAGNOSIS — M6281 Muscle weakness (generalized): Secondary | ICD-10-CM | POA: Diagnosis not present

## 2014-12-03 DIAGNOSIS — R262 Difficulty in walking, not elsewhere classified: Secondary | ICD-10-CM | POA: Diagnosis not present

## 2014-12-03 DIAGNOSIS — R2681 Unsteadiness on feet: Secondary | ICD-10-CM | POA: Diagnosis not present

## 2014-12-03 DIAGNOSIS — R278 Other lack of coordination: Secondary | ICD-10-CM | POA: Diagnosis not present

## 2014-12-03 DIAGNOSIS — I69354 Hemiplegia and hemiparesis following cerebral infarction affecting left non-dominant side: Secondary | ICD-10-CM | POA: Diagnosis not present

## 2014-12-03 DIAGNOSIS — R079 Chest pain, unspecified: Secondary | ICD-10-CM | POA: Diagnosis not present

## 2014-12-04 DIAGNOSIS — D649 Anemia, unspecified: Secondary | ICD-10-CM | POA: Diagnosis not present

## 2014-12-04 DIAGNOSIS — R2681 Unsteadiness on feet: Secondary | ICD-10-CM | POA: Diagnosis not present

## 2014-12-04 DIAGNOSIS — I69354 Hemiplegia and hemiparesis following cerebral infarction affecting left non-dominant side: Secondary | ICD-10-CM | POA: Diagnosis not present

## 2014-12-04 DIAGNOSIS — Z79899 Other long term (current) drug therapy: Secondary | ICD-10-CM | POA: Diagnosis not present

## 2014-12-04 DIAGNOSIS — N182 Chronic kidney disease, stage 2 (mild): Secondary | ICD-10-CM | POA: Diagnosis not present

## 2014-12-04 DIAGNOSIS — I10 Essential (primary) hypertension: Secondary | ICD-10-CM | POA: Diagnosis not present

## 2014-12-04 DIAGNOSIS — R079 Chest pain, unspecified: Secondary | ICD-10-CM | POA: Diagnosis not present

## 2014-12-04 DIAGNOSIS — R278 Other lack of coordination: Secondary | ICD-10-CM | POA: Diagnosis not present

## 2014-12-04 DIAGNOSIS — D72829 Elevated white blood cell count, unspecified: Secondary | ICD-10-CM | POA: Diagnosis not present

## 2014-12-04 DIAGNOSIS — M6281 Muscle weakness (generalized): Secondary | ICD-10-CM | POA: Diagnosis not present

## 2014-12-04 DIAGNOSIS — R262 Difficulty in walking, not elsewhere classified: Secondary | ICD-10-CM | POA: Diagnosis not present

## 2014-12-05 DIAGNOSIS — R079 Chest pain, unspecified: Secondary | ICD-10-CM | POA: Diagnosis not present

## 2014-12-05 DIAGNOSIS — M6281 Muscle weakness (generalized): Secondary | ICD-10-CM | POA: Diagnosis not present

## 2014-12-05 DIAGNOSIS — R262 Difficulty in walking, not elsewhere classified: Secondary | ICD-10-CM | POA: Diagnosis not present

## 2014-12-05 DIAGNOSIS — R2681 Unsteadiness on feet: Secondary | ICD-10-CM | POA: Diagnosis not present

## 2014-12-05 DIAGNOSIS — R278 Other lack of coordination: Secondary | ICD-10-CM | POA: Diagnosis not present

## 2014-12-05 DIAGNOSIS — I69354 Hemiplegia and hemiparesis following cerebral infarction affecting left non-dominant side: Secondary | ICD-10-CM | POA: Diagnosis not present

## 2014-12-06 DIAGNOSIS — R2681 Unsteadiness on feet: Secondary | ICD-10-CM | POA: Diagnosis not present

## 2014-12-06 DIAGNOSIS — R079 Chest pain, unspecified: Secondary | ICD-10-CM | POA: Diagnosis not present

## 2014-12-06 DIAGNOSIS — I69354 Hemiplegia and hemiparesis following cerebral infarction affecting left non-dominant side: Secondary | ICD-10-CM | POA: Diagnosis not present

## 2014-12-06 DIAGNOSIS — R278 Other lack of coordination: Secondary | ICD-10-CM | POA: Diagnosis not present

## 2014-12-06 DIAGNOSIS — M6281 Muscle weakness (generalized): Secondary | ICD-10-CM | POA: Diagnosis not present

## 2014-12-06 DIAGNOSIS — R262 Difficulty in walking, not elsewhere classified: Secondary | ICD-10-CM | POA: Diagnosis not present

## 2014-12-09 DIAGNOSIS — R262 Difficulty in walking, not elsewhere classified: Secondary | ICD-10-CM | POA: Diagnosis not present

## 2014-12-09 DIAGNOSIS — R079 Chest pain, unspecified: Secondary | ICD-10-CM | POA: Diagnosis not present

## 2014-12-09 DIAGNOSIS — M6281 Muscle weakness (generalized): Secondary | ICD-10-CM | POA: Diagnosis not present

## 2014-12-09 DIAGNOSIS — R278 Other lack of coordination: Secondary | ICD-10-CM | POA: Diagnosis not present

## 2014-12-09 DIAGNOSIS — R2681 Unsteadiness on feet: Secondary | ICD-10-CM | POA: Diagnosis not present

## 2014-12-09 DIAGNOSIS — I69354 Hemiplegia and hemiparesis following cerebral infarction affecting left non-dominant side: Secondary | ICD-10-CM | POA: Diagnosis not present

## 2014-12-10 DIAGNOSIS — R079 Chest pain, unspecified: Secondary | ICD-10-CM | POA: Diagnosis not present

## 2014-12-10 DIAGNOSIS — R2681 Unsteadiness on feet: Secondary | ICD-10-CM | POA: Diagnosis not present

## 2014-12-10 DIAGNOSIS — R262 Difficulty in walking, not elsewhere classified: Secondary | ICD-10-CM | POA: Diagnosis not present

## 2014-12-10 DIAGNOSIS — M6281 Muscle weakness (generalized): Secondary | ICD-10-CM | POA: Diagnosis not present

## 2014-12-10 DIAGNOSIS — R278 Other lack of coordination: Secondary | ICD-10-CM | POA: Diagnosis not present

## 2014-12-10 DIAGNOSIS — I69354 Hemiplegia and hemiparesis following cerebral infarction affecting left non-dominant side: Secondary | ICD-10-CM | POA: Diagnosis not present

## 2014-12-11 DIAGNOSIS — R079 Chest pain, unspecified: Secondary | ICD-10-CM | POA: Diagnosis not present

## 2014-12-11 DIAGNOSIS — R278 Other lack of coordination: Secondary | ICD-10-CM | POA: Diagnosis not present

## 2014-12-11 DIAGNOSIS — M6281 Muscle weakness (generalized): Secondary | ICD-10-CM | POA: Diagnosis not present

## 2014-12-11 DIAGNOSIS — R262 Difficulty in walking, not elsewhere classified: Secondary | ICD-10-CM | POA: Diagnosis not present

## 2014-12-11 DIAGNOSIS — R2681 Unsteadiness on feet: Secondary | ICD-10-CM | POA: Diagnosis not present

## 2014-12-11 DIAGNOSIS — I69354 Hemiplegia and hemiparesis following cerebral infarction affecting left non-dominant side: Secondary | ICD-10-CM | POA: Diagnosis not present

## 2014-12-12 DIAGNOSIS — R278 Other lack of coordination: Secondary | ICD-10-CM | POA: Diagnosis not present

## 2014-12-12 DIAGNOSIS — R262 Difficulty in walking, not elsewhere classified: Secondary | ICD-10-CM | POA: Diagnosis not present

## 2014-12-12 DIAGNOSIS — M6281 Muscle weakness (generalized): Secondary | ICD-10-CM | POA: Diagnosis not present

## 2014-12-12 DIAGNOSIS — I69354 Hemiplegia and hemiparesis following cerebral infarction affecting left non-dominant side: Secondary | ICD-10-CM | POA: Diagnosis not present

## 2014-12-12 DIAGNOSIS — R079 Chest pain, unspecified: Secondary | ICD-10-CM | POA: Diagnosis not present

## 2014-12-12 DIAGNOSIS — R2681 Unsteadiness on feet: Secondary | ICD-10-CM | POA: Diagnosis not present

## 2014-12-13 DIAGNOSIS — R079 Chest pain, unspecified: Secondary | ICD-10-CM | POA: Diagnosis not present

## 2014-12-13 DIAGNOSIS — I69354 Hemiplegia and hemiparesis following cerebral infarction affecting left non-dominant side: Secondary | ICD-10-CM | POA: Diagnosis not present

## 2014-12-13 DIAGNOSIS — R2681 Unsteadiness on feet: Secondary | ICD-10-CM | POA: Diagnosis not present

## 2014-12-13 DIAGNOSIS — M6281 Muscle weakness (generalized): Secondary | ICD-10-CM | POA: Diagnosis not present

## 2014-12-13 DIAGNOSIS — R278 Other lack of coordination: Secondary | ICD-10-CM | POA: Diagnosis not present

## 2014-12-13 DIAGNOSIS — R262 Difficulty in walking, not elsewhere classified: Secondary | ICD-10-CM | POA: Diagnosis not present

## 2014-12-14 DIAGNOSIS — M6281 Muscle weakness (generalized): Secondary | ICD-10-CM | POA: Diagnosis not present

## 2014-12-14 DIAGNOSIS — I69354 Hemiplegia and hemiparesis following cerebral infarction affecting left non-dominant side: Secondary | ICD-10-CM | POA: Diagnosis not present

## 2014-12-14 DIAGNOSIS — R2681 Unsteadiness on feet: Secondary | ICD-10-CM | POA: Diagnosis not present

## 2014-12-14 DIAGNOSIS — R278 Other lack of coordination: Secondary | ICD-10-CM | POA: Diagnosis not present

## 2014-12-14 DIAGNOSIS — R079 Chest pain, unspecified: Secondary | ICD-10-CM | POA: Diagnosis not present

## 2014-12-14 DIAGNOSIS — R262 Difficulty in walking, not elsewhere classified: Secondary | ICD-10-CM | POA: Diagnosis not present

## 2014-12-15 DIAGNOSIS — R079 Chest pain, unspecified: Secondary | ICD-10-CM | POA: Diagnosis not present

## 2014-12-15 DIAGNOSIS — R2681 Unsteadiness on feet: Secondary | ICD-10-CM | POA: Diagnosis not present

## 2014-12-15 DIAGNOSIS — R278 Other lack of coordination: Secondary | ICD-10-CM | POA: Diagnosis not present

## 2014-12-15 DIAGNOSIS — I69354 Hemiplegia and hemiparesis following cerebral infarction affecting left non-dominant side: Secondary | ICD-10-CM | POA: Diagnosis not present

## 2014-12-15 DIAGNOSIS — R262 Difficulty in walking, not elsewhere classified: Secondary | ICD-10-CM | POA: Diagnosis not present

## 2014-12-15 DIAGNOSIS — M6281 Muscle weakness (generalized): Secondary | ICD-10-CM | POA: Diagnosis not present

## 2014-12-16 DIAGNOSIS — I69354 Hemiplegia and hemiparesis following cerebral infarction affecting left non-dominant side: Secondary | ICD-10-CM | POA: Diagnosis not present

## 2014-12-16 DIAGNOSIS — M6281 Muscle weakness (generalized): Secondary | ICD-10-CM | POA: Diagnosis not present

## 2014-12-16 DIAGNOSIS — R079 Chest pain, unspecified: Secondary | ICD-10-CM | POA: Diagnosis not present

## 2014-12-16 DIAGNOSIS — R278 Other lack of coordination: Secondary | ICD-10-CM | POA: Diagnosis not present

## 2014-12-16 DIAGNOSIS — R2681 Unsteadiness on feet: Secondary | ICD-10-CM | POA: Diagnosis not present

## 2014-12-16 DIAGNOSIS — R262 Difficulty in walking, not elsewhere classified: Secondary | ICD-10-CM | POA: Diagnosis not present

## 2014-12-17 DIAGNOSIS — R262 Difficulty in walking, not elsewhere classified: Secondary | ICD-10-CM | POA: Diagnosis not present

## 2014-12-17 DIAGNOSIS — R278 Other lack of coordination: Secondary | ICD-10-CM | POA: Diagnosis not present

## 2014-12-17 DIAGNOSIS — R079 Chest pain, unspecified: Secondary | ICD-10-CM | POA: Diagnosis not present

## 2014-12-17 DIAGNOSIS — R2681 Unsteadiness on feet: Secondary | ICD-10-CM | POA: Diagnosis not present

## 2014-12-17 DIAGNOSIS — I69354 Hemiplegia and hemiparesis following cerebral infarction affecting left non-dominant side: Secondary | ICD-10-CM | POA: Diagnosis not present

## 2014-12-17 DIAGNOSIS — M6281 Muscle weakness (generalized): Secondary | ICD-10-CM | POA: Diagnosis not present

## 2014-12-18 DIAGNOSIS — M6281 Muscle weakness (generalized): Secondary | ICD-10-CM | POA: Diagnosis not present

## 2014-12-18 DIAGNOSIS — R2681 Unsteadiness on feet: Secondary | ICD-10-CM | POA: Diagnosis not present

## 2014-12-18 DIAGNOSIS — I69354 Hemiplegia and hemiparesis following cerebral infarction affecting left non-dominant side: Secondary | ICD-10-CM | POA: Diagnosis not present

## 2014-12-18 DIAGNOSIS — R079 Chest pain, unspecified: Secondary | ICD-10-CM | POA: Diagnosis not present

## 2014-12-18 DIAGNOSIS — R262 Difficulty in walking, not elsewhere classified: Secondary | ICD-10-CM | POA: Diagnosis not present

## 2014-12-18 DIAGNOSIS — R278 Other lack of coordination: Secondary | ICD-10-CM | POA: Diagnosis not present

## 2014-12-19 DIAGNOSIS — M6281 Muscle weakness (generalized): Secondary | ICD-10-CM | POA: Diagnosis not present

## 2014-12-19 DIAGNOSIS — R079 Chest pain, unspecified: Secondary | ICD-10-CM | POA: Diagnosis not present

## 2014-12-19 DIAGNOSIS — I69354 Hemiplegia and hemiparesis following cerebral infarction affecting left non-dominant side: Secondary | ICD-10-CM | POA: Diagnosis not present

## 2014-12-19 DIAGNOSIS — R2681 Unsteadiness on feet: Secondary | ICD-10-CM | POA: Diagnosis not present

## 2014-12-19 DIAGNOSIS — R262 Difficulty in walking, not elsewhere classified: Secondary | ICD-10-CM | POA: Diagnosis not present

## 2014-12-19 DIAGNOSIS — R278 Other lack of coordination: Secondary | ICD-10-CM | POA: Diagnosis not present

## 2014-12-20 DIAGNOSIS — M6281 Muscle weakness (generalized): Secondary | ICD-10-CM | POA: Diagnosis not present

## 2014-12-20 DIAGNOSIS — R262 Difficulty in walking, not elsewhere classified: Secondary | ICD-10-CM | POA: Diagnosis not present

## 2014-12-20 DIAGNOSIS — R278 Other lack of coordination: Secondary | ICD-10-CM | POA: Diagnosis not present

## 2014-12-20 DIAGNOSIS — R079 Chest pain, unspecified: Secondary | ICD-10-CM | POA: Diagnosis not present

## 2014-12-20 DIAGNOSIS — I69354 Hemiplegia and hemiparesis following cerebral infarction affecting left non-dominant side: Secondary | ICD-10-CM | POA: Diagnosis not present

## 2014-12-20 DIAGNOSIS — R2681 Unsteadiness on feet: Secondary | ICD-10-CM | POA: Diagnosis not present

## 2014-12-21 DIAGNOSIS — R262 Difficulty in walking, not elsewhere classified: Secondary | ICD-10-CM | POA: Diagnosis not present

## 2014-12-21 DIAGNOSIS — M6281 Muscle weakness (generalized): Secondary | ICD-10-CM | POA: Diagnosis not present

## 2014-12-21 DIAGNOSIS — R1312 Dysphagia, oropharyngeal phase: Secondary | ICD-10-CM | POA: Diagnosis not present

## 2014-12-21 DIAGNOSIS — R2681 Unsteadiness on feet: Secondary | ICD-10-CM | POA: Diagnosis not present

## 2014-12-21 DIAGNOSIS — I69354 Hemiplegia and hemiparesis following cerebral infarction affecting left non-dominant side: Secondary | ICD-10-CM | POA: Diagnosis not present

## 2014-12-21 DIAGNOSIS — R079 Chest pain, unspecified: Secondary | ICD-10-CM | POA: Diagnosis not present

## 2014-12-21 DIAGNOSIS — R278 Other lack of coordination: Secondary | ICD-10-CM | POA: Diagnosis not present

## 2014-12-22 DIAGNOSIS — R1310 Dysphagia, unspecified: Secondary | ICD-10-CM | POA: Diagnosis not present

## 2014-12-22 DIAGNOSIS — N182 Chronic kidney disease, stage 2 (mild): Secondary | ICD-10-CM | POA: Diagnosis not present

## 2014-12-22 DIAGNOSIS — M6281 Muscle weakness (generalized): Secondary | ICD-10-CM | POA: Diagnosis not present

## 2014-12-22 DIAGNOSIS — R079 Chest pain, unspecified: Secondary | ICD-10-CM | POA: Diagnosis not present

## 2014-12-22 DIAGNOSIS — R2681 Unsteadiness on feet: Secondary | ICD-10-CM | POA: Diagnosis not present

## 2014-12-22 DIAGNOSIS — R262 Difficulty in walking, not elsewhere classified: Secondary | ICD-10-CM | POA: Diagnosis not present

## 2014-12-22 DIAGNOSIS — G8190 Hemiplegia, unspecified affecting unspecified side: Secondary | ICD-10-CM | POA: Diagnosis not present

## 2014-12-22 DIAGNOSIS — I69354 Hemiplegia and hemiparesis following cerebral infarction affecting left non-dominant side: Secondary | ICD-10-CM | POA: Diagnosis not present

## 2014-12-22 DIAGNOSIS — R278 Other lack of coordination: Secondary | ICD-10-CM | POA: Diagnosis not present

## 2014-12-22 DIAGNOSIS — D649 Anemia, unspecified: Secondary | ICD-10-CM | POA: Diagnosis not present

## 2014-12-23 DIAGNOSIS — D72829 Elevated white blood cell count, unspecified: Secondary | ICD-10-CM | POA: Diagnosis not present

## 2014-12-23 DIAGNOSIS — R2681 Unsteadiness on feet: Secondary | ICD-10-CM | POA: Diagnosis not present

## 2014-12-23 DIAGNOSIS — M6281 Muscle weakness (generalized): Secondary | ICD-10-CM | POA: Diagnosis not present

## 2014-12-23 DIAGNOSIS — I69354 Hemiplegia and hemiparesis following cerebral infarction affecting left non-dominant side: Secondary | ICD-10-CM | POA: Diagnosis not present

## 2014-12-23 DIAGNOSIS — R278 Other lack of coordination: Secondary | ICD-10-CM | POA: Diagnosis not present

## 2014-12-23 DIAGNOSIS — R262 Difficulty in walking, not elsewhere classified: Secondary | ICD-10-CM | POA: Diagnosis not present

## 2014-12-23 DIAGNOSIS — I69322 Dysarthria following cerebral infarction: Secondary | ICD-10-CM | POA: Diagnosis not present

## 2014-12-23 DIAGNOSIS — R079 Chest pain, unspecified: Secondary | ICD-10-CM | POA: Diagnosis not present

## 2014-12-24 DIAGNOSIS — R278 Other lack of coordination: Secondary | ICD-10-CM | POA: Diagnosis not present

## 2014-12-24 DIAGNOSIS — M6281 Muscle weakness (generalized): Secondary | ICD-10-CM | POA: Diagnosis not present

## 2014-12-24 DIAGNOSIS — R079 Chest pain, unspecified: Secondary | ICD-10-CM | POA: Diagnosis not present

## 2014-12-24 DIAGNOSIS — R262 Difficulty in walking, not elsewhere classified: Secondary | ICD-10-CM | POA: Diagnosis not present

## 2014-12-24 DIAGNOSIS — I69354 Hemiplegia and hemiparesis following cerebral infarction affecting left non-dominant side: Secondary | ICD-10-CM | POA: Diagnosis not present

## 2014-12-24 DIAGNOSIS — R2681 Unsteadiness on feet: Secondary | ICD-10-CM | POA: Diagnosis not present

## 2014-12-25 DIAGNOSIS — M6281 Muscle weakness (generalized): Secondary | ICD-10-CM | POA: Diagnosis not present

## 2014-12-25 DIAGNOSIS — I69354 Hemiplegia and hemiparesis following cerebral infarction affecting left non-dominant side: Secondary | ICD-10-CM | POA: Diagnosis not present

## 2014-12-25 DIAGNOSIS — R079 Chest pain, unspecified: Secondary | ICD-10-CM | POA: Diagnosis not present

## 2014-12-25 DIAGNOSIS — R2681 Unsteadiness on feet: Secondary | ICD-10-CM | POA: Diagnosis not present

## 2014-12-25 DIAGNOSIS — R278 Other lack of coordination: Secondary | ICD-10-CM | POA: Diagnosis not present

## 2014-12-25 DIAGNOSIS — R262 Difficulty in walking, not elsewhere classified: Secondary | ICD-10-CM | POA: Diagnosis not present

## 2014-12-26 DIAGNOSIS — R278 Other lack of coordination: Secondary | ICD-10-CM | POA: Diagnosis not present

## 2014-12-26 DIAGNOSIS — M6281 Muscle weakness (generalized): Secondary | ICD-10-CM | POA: Diagnosis not present

## 2014-12-26 DIAGNOSIS — I69354 Hemiplegia and hemiparesis following cerebral infarction affecting left non-dominant side: Secondary | ICD-10-CM | POA: Diagnosis not present

## 2014-12-26 DIAGNOSIS — R079 Chest pain, unspecified: Secondary | ICD-10-CM | POA: Diagnosis not present

## 2014-12-26 DIAGNOSIS — R262 Difficulty in walking, not elsewhere classified: Secondary | ICD-10-CM | POA: Diagnosis not present

## 2014-12-26 DIAGNOSIS — R2681 Unsteadiness on feet: Secondary | ICD-10-CM | POA: Diagnosis not present

## 2014-12-27 DIAGNOSIS — R278 Other lack of coordination: Secondary | ICD-10-CM | POA: Diagnosis not present

## 2014-12-27 DIAGNOSIS — R079 Chest pain, unspecified: Secondary | ICD-10-CM | POA: Diagnosis not present

## 2014-12-27 DIAGNOSIS — R2681 Unsteadiness on feet: Secondary | ICD-10-CM | POA: Diagnosis not present

## 2014-12-27 DIAGNOSIS — R262 Difficulty in walking, not elsewhere classified: Secondary | ICD-10-CM | POA: Diagnosis not present

## 2014-12-27 DIAGNOSIS — I69354 Hemiplegia and hemiparesis following cerebral infarction affecting left non-dominant side: Secondary | ICD-10-CM | POA: Diagnosis not present

## 2014-12-27 DIAGNOSIS — M6281 Muscle weakness (generalized): Secondary | ICD-10-CM | POA: Diagnosis not present

## 2014-12-28 DIAGNOSIS — R262 Difficulty in walking, not elsewhere classified: Secondary | ICD-10-CM | POA: Diagnosis not present

## 2014-12-28 DIAGNOSIS — I69354 Hemiplegia and hemiparesis following cerebral infarction affecting left non-dominant side: Secondary | ICD-10-CM | POA: Diagnosis not present

## 2014-12-28 DIAGNOSIS — R278 Other lack of coordination: Secondary | ICD-10-CM | POA: Diagnosis not present

## 2014-12-28 DIAGNOSIS — R079 Chest pain, unspecified: Secondary | ICD-10-CM | POA: Diagnosis not present

## 2014-12-28 DIAGNOSIS — R2681 Unsteadiness on feet: Secondary | ICD-10-CM | POA: Diagnosis not present

## 2014-12-28 DIAGNOSIS — M6281 Muscle weakness (generalized): Secondary | ICD-10-CM | POA: Diagnosis not present

## 2014-12-30 DIAGNOSIS — M6281 Muscle weakness (generalized): Secondary | ICD-10-CM | POA: Diagnosis not present

## 2014-12-30 DIAGNOSIS — R262 Difficulty in walking, not elsewhere classified: Secondary | ICD-10-CM | POA: Diagnosis not present

## 2014-12-30 DIAGNOSIS — R2681 Unsteadiness on feet: Secondary | ICD-10-CM | POA: Diagnosis not present

## 2014-12-30 DIAGNOSIS — R079 Chest pain, unspecified: Secondary | ICD-10-CM | POA: Diagnosis not present

## 2014-12-30 DIAGNOSIS — R278 Other lack of coordination: Secondary | ICD-10-CM | POA: Diagnosis not present

## 2014-12-30 DIAGNOSIS — I69354 Hemiplegia and hemiparesis following cerebral infarction affecting left non-dominant side: Secondary | ICD-10-CM | POA: Diagnosis not present

## 2014-12-31 DIAGNOSIS — R079 Chest pain, unspecified: Secondary | ICD-10-CM | POA: Diagnosis not present

## 2014-12-31 DIAGNOSIS — M6281 Muscle weakness (generalized): Secondary | ICD-10-CM | POA: Diagnosis not present

## 2014-12-31 DIAGNOSIS — R2681 Unsteadiness on feet: Secondary | ICD-10-CM | POA: Diagnosis not present

## 2014-12-31 DIAGNOSIS — R278 Other lack of coordination: Secondary | ICD-10-CM | POA: Diagnosis not present

## 2014-12-31 DIAGNOSIS — I69354 Hemiplegia and hemiparesis following cerebral infarction affecting left non-dominant side: Secondary | ICD-10-CM | POA: Diagnosis not present

## 2014-12-31 DIAGNOSIS — R262 Difficulty in walking, not elsewhere classified: Secondary | ICD-10-CM | POA: Diagnosis not present

## 2015-01-01 DIAGNOSIS — R2681 Unsteadiness on feet: Secondary | ICD-10-CM | POA: Diagnosis not present

## 2015-01-01 DIAGNOSIS — I69354 Hemiplegia and hemiparesis following cerebral infarction affecting left non-dominant side: Secondary | ICD-10-CM | POA: Diagnosis not present

## 2015-01-01 DIAGNOSIS — R079 Chest pain, unspecified: Secondary | ICD-10-CM | POA: Diagnosis not present

## 2015-01-01 DIAGNOSIS — R262 Difficulty in walking, not elsewhere classified: Secondary | ICD-10-CM | POA: Diagnosis not present

## 2015-01-01 DIAGNOSIS — M6281 Muscle weakness (generalized): Secondary | ICD-10-CM | POA: Diagnosis not present

## 2015-01-01 DIAGNOSIS — R278 Other lack of coordination: Secondary | ICD-10-CM | POA: Diagnosis not present

## 2015-01-02 DIAGNOSIS — R2681 Unsteadiness on feet: Secondary | ICD-10-CM | POA: Diagnosis not present

## 2015-01-02 DIAGNOSIS — R278 Other lack of coordination: Secondary | ICD-10-CM | POA: Diagnosis not present

## 2015-01-02 DIAGNOSIS — I69354 Hemiplegia and hemiparesis following cerebral infarction affecting left non-dominant side: Secondary | ICD-10-CM | POA: Diagnosis not present

## 2015-01-02 DIAGNOSIS — M6281 Muscle weakness (generalized): Secondary | ICD-10-CM | POA: Diagnosis not present

## 2015-01-02 DIAGNOSIS — R262 Difficulty in walking, not elsewhere classified: Secondary | ICD-10-CM | POA: Diagnosis not present

## 2015-01-02 DIAGNOSIS — R079 Chest pain, unspecified: Secondary | ICD-10-CM | POA: Diagnosis not present

## 2015-01-04 DIAGNOSIS — Z79899 Other long term (current) drug therapy: Secondary | ICD-10-CM | POA: Diagnosis not present

## 2015-01-04 DIAGNOSIS — N182 Chronic kidney disease, stage 2 (mild): Secondary | ICD-10-CM | POA: Diagnosis not present

## 2015-01-04 DIAGNOSIS — R262 Difficulty in walking, not elsewhere classified: Secondary | ICD-10-CM | POA: Diagnosis not present

## 2015-01-04 DIAGNOSIS — R2681 Unsteadiness on feet: Secondary | ICD-10-CM | POA: Diagnosis not present

## 2015-01-04 DIAGNOSIS — M6281 Muscle weakness (generalized): Secondary | ICD-10-CM | POA: Diagnosis not present

## 2015-01-04 DIAGNOSIS — R079 Chest pain, unspecified: Secondary | ICD-10-CM | POA: Diagnosis not present

## 2015-01-04 DIAGNOSIS — D72829 Elevated white blood cell count, unspecified: Secondary | ICD-10-CM | POA: Diagnosis not present

## 2015-01-04 DIAGNOSIS — D649 Anemia, unspecified: Secondary | ICD-10-CM | POA: Diagnosis not present

## 2015-01-04 DIAGNOSIS — L089 Local infection of the skin and subcutaneous tissue, unspecified: Secondary | ICD-10-CM | POA: Diagnosis not present

## 2015-01-04 DIAGNOSIS — G8190 Hemiplegia, unspecified affecting unspecified side: Secondary | ICD-10-CM | POA: Diagnosis not present

## 2015-01-04 DIAGNOSIS — I69354 Hemiplegia and hemiparesis following cerebral infarction affecting left non-dominant side: Secondary | ICD-10-CM | POA: Diagnosis not present

## 2015-01-04 DIAGNOSIS — R278 Other lack of coordination: Secondary | ICD-10-CM | POA: Diagnosis not present

## 2015-01-06 DIAGNOSIS — I69354 Hemiplegia and hemiparesis following cerebral infarction affecting left non-dominant side: Secondary | ICD-10-CM | POA: Diagnosis not present

## 2015-01-06 DIAGNOSIS — R262 Difficulty in walking, not elsewhere classified: Secondary | ICD-10-CM | POA: Diagnosis not present

## 2015-01-06 DIAGNOSIS — M6281 Muscle weakness (generalized): Secondary | ICD-10-CM | POA: Diagnosis not present

## 2015-01-06 DIAGNOSIS — R079 Chest pain, unspecified: Secondary | ICD-10-CM | POA: Diagnosis not present

## 2015-01-06 DIAGNOSIS — R278 Other lack of coordination: Secondary | ICD-10-CM | POA: Diagnosis not present

## 2015-01-06 DIAGNOSIS — R2681 Unsteadiness on feet: Secondary | ICD-10-CM | POA: Diagnosis not present

## 2015-01-07 DIAGNOSIS — I69354 Hemiplegia and hemiparesis following cerebral infarction affecting left non-dominant side: Secondary | ICD-10-CM | POA: Diagnosis not present

## 2015-01-07 DIAGNOSIS — M6281 Muscle weakness (generalized): Secondary | ICD-10-CM | POA: Diagnosis not present

## 2015-01-07 DIAGNOSIS — R278 Other lack of coordination: Secondary | ICD-10-CM | POA: Diagnosis not present

## 2015-01-07 DIAGNOSIS — R262 Difficulty in walking, not elsewhere classified: Secondary | ICD-10-CM | POA: Diagnosis not present

## 2015-01-07 DIAGNOSIS — R079 Chest pain, unspecified: Secondary | ICD-10-CM | POA: Diagnosis not present

## 2015-01-07 DIAGNOSIS — R2681 Unsteadiness on feet: Secondary | ICD-10-CM | POA: Diagnosis not present

## 2015-01-08 DIAGNOSIS — M6281 Muscle weakness (generalized): Secondary | ICD-10-CM | POA: Diagnosis not present

## 2015-01-08 DIAGNOSIS — R262 Difficulty in walking, not elsewhere classified: Secondary | ICD-10-CM | POA: Diagnosis not present

## 2015-01-08 DIAGNOSIS — L089 Local infection of the skin and subcutaneous tissue, unspecified: Secondary | ICD-10-CM | POA: Diagnosis not present

## 2015-01-08 DIAGNOSIS — R278 Other lack of coordination: Secondary | ICD-10-CM | POA: Diagnosis not present

## 2015-01-08 DIAGNOSIS — I69354 Hemiplegia and hemiparesis following cerebral infarction affecting left non-dominant side: Secondary | ICD-10-CM | POA: Diagnosis not present

## 2015-01-08 DIAGNOSIS — R2681 Unsteadiness on feet: Secondary | ICD-10-CM | POA: Diagnosis not present

## 2015-01-08 DIAGNOSIS — R079 Chest pain, unspecified: Secondary | ICD-10-CM | POA: Diagnosis not present

## 2015-01-09 DIAGNOSIS — D72829 Elevated white blood cell count, unspecified: Secondary | ICD-10-CM | POA: Diagnosis not present

## 2015-01-09 DIAGNOSIS — R278 Other lack of coordination: Secondary | ICD-10-CM | POA: Diagnosis not present

## 2015-01-09 DIAGNOSIS — R079 Chest pain, unspecified: Secondary | ICD-10-CM | POA: Diagnosis not present

## 2015-01-09 DIAGNOSIS — M6281 Muscle weakness (generalized): Secondary | ICD-10-CM | POA: Diagnosis not present

## 2015-01-09 DIAGNOSIS — R262 Difficulty in walking, not elsewhere classified: Secondary | ICD-10-CM | POA: Diagnosis not present

## 2015-01-09 DIAGNOSIS — R2681 Unsteadiness on feet: Secondary | ICD-10-CM | POA: Diagnosis not present

## 2015-01-09 DIAGNOSIS — I69354 Hemiplegia and hemiparesis following cerebral infarction affecting left non-dominant side: Secondary | ICD-10-CM | POA: Diagnosis not present

## 2015-01-10 DIAGNOSIS — R278 Other lack of coordination: Secondary | ICD-10-CM | POA: Diagnosis not present

## 2015-01-10 DIAGNOSIS — R079 Chest pain, unspecified: Secondary | ICD-10-CM | POA: Diagnosis not present

## 2015-01-10 DIAGNOSIS — M6281 Muscle weakness (generalized): Secondary | ICD-10-CM | POA: Diagnosis not present

## 2015-01-10 DIAGNOSIS — I69354 Hemiplegia and hemiparesis following cerebral infarction affecting left non-dominant side: Secondary | ICD-10-CM | POA: Diagnosis not present

## 2015-01-10 DIAGNOSIS — R262 Difficulty in walking, not elsewhere classified: Secondary | ICD-10-CM | POA: Diagnosis not present

## 2015-01-10 DIAGNOSIS — R2681 Unsteadiness on feet: Secondary | ICD-10-CM | POA: Diagnosis not present

## 2015-01-12 DIAGNOSIS — L089 Local infection of the skin and subcutaneous tissue, unspecified: Secondary | ICD-10-CM | POA: Diagnosis not present

## 2015-01-13 DIAGNOSIS — M6281 Muscle weakness (generalized): Secondary | ICD-10-CM | POA: Diagnosis not present

## 2015-01-13 DIAGNOSIS — R278 Other lack of coordination: Secondary | ICD-10-CM | POA: Diagnosis not present

## 2015-01-13 DIAGNOSIS — I69354 Hemiplegia and hemiparesis following cerebral infarction affecting left non-dominant side: Secondary | ICD-10-CM | POA: Diagnosis not present

## 2015-01-13 DIAGNOSIS — R2681 Unsteadiness on feet: Secondary | ICD-10-CM | POA: Diagnosis not present

## 2015-01-13 DIAGNOSIS — R262 Difficulty in walking, not elsewhere classified: Secondary | ICD-10-CM | POA: Diagnosis not present

## 2015-01-13 DIAGNOSIS — R079 Chest pain, unspecified: Secondary | ICD-10-CM | POA: Diagnosis not present

## 2015-01-14 DIAGNOSIS — G8190 Hemiplegia, unspecified affecting unspecified side: Secondary | ICD-10-CM | POA: Diagnosis not present

## 2015-01-14 DIAGNOSIS — D649 Anemia, unspecified: Secondary | ICD-10-CM | POA: Diagnosis not present

## 2015-01-14 DIAGNOSIS — N182 Chronic kidney disease, stage 2 (mild): Secondary | ICD-10-CM | POA: Diagnosis not present

## 2015-01-14 DIAGNOSIS — D72829 Elevated white blood cell count, unspecified: Secondary | ICD-10-CM | POA: Diagnosis not present

## 2015-01-14 DIAGNOSIS — R278 Other lack of coordination: Secondary | ICD-10-CM | POA: Diagnosis not present

## 2015-01-14 DIAGNOSIS — L089 Local infection of the skin and subcutaneous tissue, unspecified: Secondary | ICD-10-CM | POA: Diagnosis not present

## 2015-01-14 DIAGNOSIS — R262 Difficulty in walking, not elsewhere classified: Secondary | ICD-10-CM | POA: Diagnosis not present

## 2015-01-14 DIAGNOSIS — M6281 Muscle weakness (generalized): Secondary | ICD-10-CM | POA: Diagnosis not present

## 2015-01-14 DIAGNOSIS — R079 Chest pain, unspecified: Secondary | ICD-10-CM | POA: Diagnosis not present

## 2015-01-14 DIAGNOSIS — R2681 Unsteadiness on feet: Secondary | ICD-10-CM | POA: Diagnosis not present

## 2015-01-14 DIAGNOSIS — I69354 Hemiplegia and hemiparesis following cerebral infarction affecting left non-dominant side: Secondary | ICD-10-CM | POA: Diagnosis not present

## 2015-01-15 DIAGNOSIS — R079 Chest pain, unspecified: Secondary | ICD-10-CM | POA: Diagnosis not present

## 2015-01-15 DIAGNOSIS — M6281 Muscle weakness (generalized): Secondary | ICD-10-CM | POA: Diagnosis not present

## 2015-01-15 DIAGNOSIS — I69354 Hemiplegia and hemiparesis following cerebral infarction affecting left non-dominant side: Secondary | ICD-10-CM | POA: Diagnosis not present

## 2015-01-15 DIAGNOSIS — R2681 Unsteadiness on feet: Secondary | ICD-10-CM | POA: Diagnosis not present

## 2015-01-15 DIAGNOSIS — R278 Other lack of coordination: Secondary | ICD-10-CM | POA: Diagnosis not present

## 2015-01-15 DIAGNOSIS — R262 Difficulty in walking, not elsewhere classified: Secondary | ICD-10-CM | POA: Diagnosis not present

## 2015-01-16 DIAGNOSIS — M6281 Muscle weakness (generalized): Secondary | ICD-10-CM | POA: Diagnosis not present

## 2015-01-16 DIAGNOSIS — R278 Other lack of coordination: Secondary | ICD-10-CM | POA: Diagnosis not present

## 2015-01-16 DIAGNOSIS — R262 Difficulty in walking, not elsewhere classified: Secondary | ICD-10-CM | POA: Diagnosis not present

## 2015-01-16 DIAGNOSIS — R2681 Unsteadiness on feet: Secondary | ICD-10-CM | POA: Diagnosis not present

## 2015-01-16 DIAGNOSIS — I69354 Hemiplegia and hemiparesis following cerebral infarction affecting left non-dominant side: Secondary | ICD-10-CM | POA: Diagnosis not present

## 2015-01-16 DIAGNOSIS — R079 Chest pain, unspecified: Secondary | ICD-10-CM | POA: Diagnosis not present

## 2015-01-17 DIAGNOSIS — R2681 Unsteadiness on feet: Secondary | ICD-10-CM | POA: Diagnosis not present

## 2015-01-17 DIAGNOSIS — M6281 Muscle weakness (generalized): Secondary | ICD-10-CM | POA: Diagnosis not present

## 2015-01-17 DIAGNOSIS — I69354 Hemiplegia and hemiparesis following cerebral infarction affecting left non-dominant side: Secondary | ICD-10-CM | POA: Diagnosis not present

## 2015-01-17 DIAGNOSIS — R278 Other lack of coordination: Secondary | ICD-10-CM | POA: Diagnosis not present

## 2015-01-17 DIAGNOSIS — R079 Chest pain, unspecified: Secondary | ICD-10-CM | POA: Diagnosis not present

## 2015-01-17 DIAGNOSIS — R262 Difficulty in walking, not elsewhere classified: Secondary | ICD-10-CM | POA: Diagnosis not present

## 2015-01-20 DIAGNOSIS — M6281 Muscle weakness (generalized): Secondary | ICD-10-CM | POA: Diagnosis not present

## 2015-01-20 DIAGNOSIS — R278 Other lack of coordination: Secondary | ICD-10-CM | POA: Diagnosis not present

## 2015-01-20 DIAGNOSIS — R262 Difficulty in walking, not elsewhere classified: Secondary | ICD-10-CM | POA: Diagnosis not present

## 2015-01-20 DIAGNOSIS — R079 Chest pain, unspecified: Secondary | ICD-10-CM | POA: Diagnosis not present

## 2015-01-20 DIAGNOSIS — I69354 Hemiplegia and hemiparesis following cerebral infarction affecting left non-dominant side: Secondary | ICD-10-CM | POA: Diagnosis not present

## 2015-01-20 DIAGNOSIS — R2681 Unsteadiness on feet: Secondary | ICD-10-CM | POA: Diagnosis not present

## 2015-01-21 DIAGNOSIS — R1312 Dysphagia, oropharyngeal phase: Secondary | ICD-10-CM | POA: Diagnosis not present

## 2015-01-21 DIAGNOSIS — D649 Anemia, unspecified: Secondary | ICD-10-CM | POA: Diagnosis not present

## 2015-01-21 DIAGNOSIS — R079 Chest pain, unspecified: Secondary | ICD-10-CM | POA: Diagnosis not present

## 2015-01-21 DIAGNOSIS — L089 Local infection of the skin and subcutaneous tissue, unspecified: Secondary | ICD-10-CM | POA: Diagnosis not present

## 2015-01-21 DIAGNOSIS — R2681 Unsteadiness on feet: Secondary | ICD-10-CM | POA: Diagnosis not present

## 2015-01-21 DIAGNOSIS — I4891 Unspecified atrial fibrillation: Secondary | ICD-10-CM | POA: Diagnosis not present

## 2015-01-21 DIAGNOSIS — R278 Other lack of coordination: Secondary | ICD-10-CM | POA: Diagnosis not present

## 2015-01-21 DIAGNOSIS — R262 Difficulty in walking, not elsewhere classified: Secondary | ICD-10-CM | POA: Diagnosis not present

## 2015-01-21 DIAGNOSIS — I69354 Hemiplegia and hemiparesis following cerebral infarction affecting left non-dominant side: Secondary | ICD-10-CM | POA: Diagnosis not present

## 2015-01-21 DIAGNOSIS — M6281 Muscle weakness (generalized): Secondary | ICD-10-CM | POA: Diagnosis not present

## 2015-01-21 DIAGNOSIS — D72829 Elevated white blood cell count, unspecified: Secondary | ICD-10-CM | POA: Diagnosis not present

## 2015-01-22 DIAGNOSIS — I69354 Hemiplegia and hemiparesis following cerebral infarction affecting left non-dominant side: Secondary | ICD-10-CM | POA: Diagnosis not present

## 2015-01-22 DIAGNOSIS — R262 Difficulty in walking, not elsewhere classified: Secondary | ICD-10-CM | POA: Diagnosis not present

## 2015-01-22 DIAGNOSIS — R079 Chest pain, unspecified: Secondary | ICD-10-CM | POA: Diagnosis not present

## 2015-01-22 DIAGNOSIS — R2681 Unsteadiness on feet: Secondary | ICD-10-CM | POA: Diagnosis not present

## 2015-01-22 DIAGNOSIS — M6281 Muscle weakness (generalized): Secondary | ICD-10-CM | POA: Diagnosis not present

## 2015-01-22 DIAGNOSIS — R278 Other lack of coordination: Secondary | ICD-10-CM | POA: Diagnosis not present

## 2015-01-23 DIAGNOSIS — R262 Difficulty in walking, not elsewhere classified: Secondary | ICD-10-CM | POA: Diagnosis not present

## 2015-01-23 DIAGNOSIS — I69354 Hemiplegia and hemiparesis following cerebral infarction affecting left non-dominant side: Secondary | ICD-10-CM | POA: Diagnosis not present

## 2015-01-23 DIAGNOSIS — R079 Chest pain, unspecified: Secondary | ICD-10-CM | POA: Diagnosis not present

## 2015-01-23 DIAGNOSIS — R2681 Unsteadiness on feet: Secondary | ICD-10-CM | POA: Diagnosis not present

## 2015-01-23 DIAGNOSIS — M6281 Muscle weakness (generalized): Secondary | ICD-10-CM | POA: Diagnosis not present

## 2015-01-23 DIAGNOSIS — R278 Other lack of coordination: Secondary | ICD-10-CM | POA: Diagnosis not present

## 2015-01-24 DIAGNOSIS — R079 Chest pain, unspecified: Secondary | ICD-10-CM | POA: Diagnosis not present

## 2015-01-24 DIAGNOSIS — I69354 Hemiplegia and hemiparesis following cerebral infarction affecting left non-dominant side: Secondary | ICD-10-CM | POA: Diagnosis not present

## 2015-01-24 DIAGNOSIS — R262 Difficulty in walking, not elsewhere classified: Secondary | ICD-10-CM | POA: Diagnosis not present

## 2015-01-24 DIAGNOSIS — R278 Other lack of coordination: Secondary | ICD-10-CM | POA: Diagnosis not present

## 2015-01-24 DIAGNOSIS — R2681 Unsteadiness on feet: Secondary | ICD-10-CM | POA: Diagnosis not present

## 2015-01-24 DIAGNOSIS — M6281 Muscle weakness (generalized): Secondary | ICD-10-CM | POA: Diagnosis not present

## 2015-01-27 DIAGNOSIS — R079 Chest pain, unspecified: Secondary | ICD-10-CM | POA: Diagnosis not present

## 2015-01-27 DIAGNOSIS — R262 Difficulty in walking, not elsewhere classified: Secondary | ICD-10-CM | POA: Diagnosis not present

## 2015-01-27 DIAGNOSIS — R278 Other lack of coordination: Secondary | ICD-10-CM | POA: Diagnosis not present

## 2015-01-27 DIAGNOSIS — I69354 Hemiplegia and hemiparesis following cerebral infarction affecting left non-dominant side: Secondary | ICD-10-CM | POA: Diagnosis not present

## 2015-01-27 DIAGNOSIS — R2681 Unsteadiness on feet: Secondary | ICD-10-CM | POA: Diagnosis not present

## 2015-01-27 DIAGNOSIS — M6281 Muscle weakness (generalized): Secondary | ICD-10-CM | POA: Diagnosis not present

## 2015-01-28 DIAGNOSIS — R079 Chest pain, unspecified: Secondary | ICD-10-CM | POA: Diagnosis not present

## 2015-01-28 DIAGNOSIS — I69354 Hemiplegia and hemiparesis following cerebral infarction affecting left non-dominant side: Secondary | ICD-10-CM | POA: Diagnosis not present

## 2015-01-28 DIAGNOSIS — R262 Difficulty in walking, not elsewhere classified: Secondary | ICD-10-CM | POA: Diagnosis not present

## 2015-01-28 DIAGNOSIS — M6281 Muscle weakness (generalized): Secondary | ICD-10-CM | POA: Diagnosis not present

## 2015-01-28 DIAGNOSIS — R278 Other lack of coordination: Secondary | ICD-10-CM | POA: Diagnosis not present

## 2015-01-28 DIAGNOSIS — R2681 Unsteadiness on feet: Secondary | ICD-10-CM | POA: Diagnosis not present

## 2015-01-29 DIAGNOSIS — R262 Difficulty in walking, not elsewhere classified: Secondary | ICD-10-CM | POA: Diagnosis not present

## 2015-01-29 DIAGNOSIS — M6281 Muscle weakness (generalized): Secondary | ICD-10-CM | POA: Diagnosis not present

## 2015-01-29 DIAGNOSIS — I69354 Hemiplegia and hemiparesis following cerebral infarction affecting left non-dominant side: Secondary | ICD-10-CM | POA: Diagnosis not present

## 2015-01-29 DIAGNOSIS — R278 Other lack of coordination: Secondary | ICD-10-CM | POA: Diagnosis not present

## 2015-01-29 DIAGNOSIS — R2681 Unsteadiness on feet: Secondary | ICD-10-CM | POA: Diagnosis not present

## 2015-01-29 DIAGNOSIS — R079 Chest pain, unspecified: Secondary | ICD-10-CM | POA: Diagnosis not present

## 2015-01-30 DIAGNOSIS — R2681 Unsteadiness on feet: Secondary | ICD-10-CM | POA: Diagnosis not present

## 2015-01-30 DIAGNOSIS — R262 Difficulty in walking, not elsewhere classified: Secondary | ICD-10-CM | POA: Diagnosis not present

## 2015-01-30 DIAGNOSIS — I69354 Hemiplegia and hemiparesis following cerebral infarction affecting left non-dominant side: Secondary | ICD-10-CM | POA: Diagnosis not present

## 2015-01-30 DIAGNOSIS — M6281 Muscle weakness (generalized): Secondary | ICD-10-CM | POA: Diagnosis not present

## 2015-01-30 DIAGNOSIS — R079 Chest pain, unspecified: Secondary | ICD-10-CM | POA: Diagnosis not present

## 2015-01-30 DIAGNOSIS — R278 Other lack of coordination: Secondary | ICD-10-CM | POA: Diagnosis not present

## 2015-01-31 DIAGNOSIS — R2681 Unsteadiness on feet: Secondary | ICD-10-CM | POA: Diagnosis not present

## 2015-01-31 DIAGNOSIS — R262 Difficulty in walking, not elsewhere classified: Secondary | ICD-10-CM | POA: Diagnosis not present

## 2015-01-31 DIAGNOSIS — M6281 Muscle weakness (generalized): Secondary | ICD-10-CM | POA: Diagnosis not present

## 2015-01-31 DIAGNOSIS — R079 Chest pain, unspecified: Secondary | ICD-10-CM | POA: Diagnosis not present

## 2015-01-31 DIAGNOSIS — I69354 Hemiplegia and hemiparesis following cerebral infarction affecting left non-dominant side: Secondary | ICD-10-CM | POA: Diagnosis not present

## 2015-01-31 DIAGNOSIS — R278 Other lack of coordination: Secondary | ICD-10-CM | POA: Diagnosis not present

## 2015-02-03 DIAGNOSIS — R1310 Dysphagia, unspecified: Secondary | ICD-10-CM | POA: Diagnosis not present

## 2015-02-03 DIAGNOSIS — R2681 Unsteadiness on feet: Secondary | ICD-10-CM | POA: Diagnosis not present

## 2015-02-03 DIAGNOSIS — R278 Other lack of coordination: Secondary | ICD-10-CM | POA: Diagnosis not present

## 2015-02-03 DIAGNOSIS — R262 Difficulty in walking, not elsewhere classified: Secondary | ICD-10-CM | POA: Diagnosis not present

## 2015-02-03 DIAGNOSIS — R079 Chest pain, unspecified: Secondary | ICD-10-CM | POA: Diagnosis not present

## 2015-02-03 DIAGNOSIS — I69354 Hemiplegia and hemiparesis following cerebral infarction affecting left non-dominant side: Secondary | ICD-10-CM | POA: Diagnosis not present

## 2015-02-03 DIAGNOSIS — M6281 Muscle weakness (generalized): Secondary | ICD-10-CM | POA: Diagnosis not present

## 2015-02-04 DIAGNOSIS — R4182 Altered mental status, unspecified: Secondary | ICD-10-CM | POA: Diagnosis not present

## 2015-02-04 DIAGNOSIS — R2681 Unsteadiness on feet: Secondary | ICD-10-CM | POA: Diagnosis not present

## 2015-02-04 DIAGNOSIS — R262 Difficulty in walking, not elsewhere classified: Secondary | ICD-10-CM | POA: Diagnosis not present

## 2015-02-04 DIAGNOSIS — R079 Chest pain, unspecified: Secondary | ICD-10-CM | POA: Diagnosis not present

## 2015-02-04 DIAGNOSIS — E441 Mild protein-calorie malnutrition: Secondary | ICD-10-CM | POA: Diagnosis not present

## 2015-02-04 DIAGNOSIS — M6281 Muscle weakness (generalized): Secondary | ICD-10-CM | POA: Diagnosis not present

## 2015-02-04 DIAGNOSIS — E039 Hypothyroidism, unspecified: Secondary | ICD-10-CM | POA: Diagnosis not present

## 2015-02-04 DIAGNOSIS — R278 Other lack of coordination: Secondary | ICD-10-CM | POA: Diagnosis not present

## 2015-02-04 DIAGNOSIS — E559 Vitamin D deficiency, unspecified: Secondary | ICD-10-CM | POA: Diagnosis not present

## 2015-02-04 DIAGNOSIS — I69354 Hemiplegia and hemiparesis following cerebral infarction affecting left non-dominant side: Secondary | ICD-10-CM | POA: Diagnosis not present

## 2015-02-04 DIAGNOSIS — R1310 Dysphagia, unspecified: Secondary | ICD-10-CM | POA: Diagnosis not present

## 2015-02-04 DIAGNOSIS — E119 Type 2 diabetes mellitus without complications: Secondary | ICD-10-CM | POA: Diagnosis not present

## 2015-02-05 DIAGNOSIS — R262 Difficulty in walking, not elsewhere classified: Secondary | ICD-10-CM | POA: Diagnosis not present

## 2015-02-05 DIAGNOSIS — M6281 Muscle weakness (generalized): Secondary | ICD-10-CM | POA: Diagnosis not present

## 2015-02-05 DIAGNOSIS — R079 Chest pain, unspecified: Secondary | ICD-10-CM | POA: Diagnosis not present

## 2015-02-05 DIAGNOSIS — I69354 Hemiplegia and hemiparesis following cerebral infarction affecting left non-dominant side: Secondary | ICD-10-CM | POA: Diagnosis not present

## 2015-02-05 DIAGNOSIS — R278 Other lack of coordination: Secondary | ICD-10-CM | POA: Diagnosis not present

## 2015-02-05 DIAGNOSIS — R2681 Unsteadiness on feet: Secondary | ICD-10-CM | POA: Diagnosis not present

## 2015-02-11 DIAGNOSIS — G40901 Epilepsy, unspecified, not intractable, with status epilepticus: Secondary | ICD-10-CM | POA: Diagnosis not present

## 2015-02-17 DIAGNOSIS — E441 Mild protein-calorie malnutrition: Secondary | ICD-10-CM | POA: Diagnosis not present

## 2015-02-17 DIAGNOSIS — R1310 Dysphagia, unspecified: Secondary | ICD-10-CM | POA: Diagnosis not present

## 2015-02-20 DIAGNOSIS — E441 Mild protein-calorie malnutrition: Secondary | ICD-10-CM | POA: Diagnosis not present

## 2015-02-20 DIAGNOSIS — I639 Cerebral infarction, unspecified: Secondary | ICD-10-CM | POA: Diagnosis not present

## 2015-02-20 DIAGNOSIS — R1310 Dysphagia, unspecified: Secondary | ICD-10-CM | POA: Diagnosis not present

## 2015-02-20 DIAGNOSIS — R197 Diarrhea, unspecified: Secondary | ICD-10-CM | POA: Diagnosis not present

## 2015-02-20 DIAGNOSIS — D72829 Elevated white blood cell count, unspecified: Secondary | ICD-10-CM | POA: Diagnosis not present

## 2015-02-20 DIAGNOSIS — D649 Anemia, unspecified: Secondary | ICD-10-CM | POA: Diagnosis not present

## 2015-02-21 DIAGNOSIS — E441 Mild protein-calorie malnutrition: Secondary | ICD-10-CM | POA: Diagnosis not present

## 2015-02-21 DIAGNOSIS — D72829 Elevated white blood cell count, unspecified: Secondary | ICD-10-CM | POA: Diagnosis not present

## 2015-02-21 DIAGNOSIS — R197 Diarrhea, unspecified: Secondary | ICD-10-CM | POA: Diagnosis not present

## 2015-02-21 DIAGNOSIS — D649 Anemia, unspecified: Secondary | ICD-10-CM | POA: Diagnosis not present

## 2015-02-21 DIAGNOSIS — I639 Cerebral infarction, unspecified: Secondary | ICD-10-CM | POA: Diagnosis not present

## 2015-02-21 DIAGNOSIS — R1310 Dysphagia, unspecified: Secondary | ICD-10-CM | POA: Diagnosis not present

## 2015-02-28 ENCOUNTER — Emergency Department (HOSPITAL_COMMUNITY)
Admission: EM | Admit: 2015-02-28 | Discharge: 2015-03-01 | Disposition: A | Discharge status: Home / Self Care | Payer: Medicare Other | Attending: Emergency Medicine | Admitting: Emergency Medicine

## 2015-02-28 ENCOUNTER — Emergency Department (HOSPITAL_COMMUNITY): Payer: Medicare Other

## 2015-02-28 ENCOUNTER — Encounter (HOSPITAL_COMMUNITY): Payer: Self-pay | Admitting: *Deleted

## 2015-02-28 DIAGNOSIS — Z87891 Personal history of nicotine dependence: Secondary | ICD-10-CM | POA: Diagnosis not present

## 2015-02-28 DIAGNOSIS — I4891 Unspecified atrial fibrillation: Secondary | ICD-10-CM | POA: Diagnosis not present

## 2015-02-28 DIAGNOSIS — Z79899 Other long term (current) drug therapy: Secondary | ICD-10-CM | POA: Diagnosis not present

## 2015-02-28 DIAGNOSIS — K219 Gastro-esophageal reflux disease without esophagitis: Secondary | ICD-10-CM | POA: Diagnosis not present

## 2015-02-28 DIAGNOSIS — Y939 Activity, unspecified: Secondary | ICD-10-CM | POA: Insufficient documentation

## 2015-02-28 DIAGNOSIS — Z7982 Long term (current) use of aspirin: Secondary | ICD-10-CM | POA: Insufficient documentation

## 2015-02-28 DIAGNOSIS — R079 Chest pain, unspecified: Secondary | ICD-10-CM | POA: Diagnosis not present

## 2015-02-28 DIAGNOSIS — Z7901 Long term (current) use of anticoagulants: Secondary | ICD-10-CM | POA: Diagnosis not present

## 2015-02-28 DIAGNOSIS — Z7951 Long term (current) use of inhaled steroids: Secondary | ICD-10-CM | POA: Diagnosis not present

## 2015-02-28 DIAGNOSIS — I10 Essential (primary) hypertension: Secondary | ICD-10-CM | POA: Insufficient documentation

## 2015-02-28 DIAGNOSIS — Y929 Unspecified place or not applicable: Secondary | ICD-10-CM | POA: Diagnosis not present

## 2015-02-28 DIAGNOSIS — J449 Chronic obstructive pulmonary disease, unspecified: Secondary | ICD-10-CM | POA: Diagnosis not present

## 2015-02-28 DIAGNOSIS — R55 Syncope and collapse: Secondary | ICD-10-CM | POA: Diagnosis not present

## 2015-02-28 DIAGNOSIS — Z85820 Personal history of malignant melanoma of skin: Secondary | ICD-10-CM | POA: Diagnosis not present

## 2015-02-28 DIAGNOSIS — M1990 Unspecified osteoarthritis, unspecified site: Secondary | ICD-10-CM | POA: Insufficient documentation

## 2015-02-28 DIAGNOSIS — I2510 Atherosclerotic heart disease of native coronary artery without angina pectoris: Secondary | ICD-10-CM | POA: Diagnosis not present

## 2015-02-28 DIAGNOSIS — Z955 Presence of coronary angioplasty implant and graft: Secondary | ICD-10-CM | POA: Diagnosis not present

## 2015-02-28 DIAGNOSIS — W050XXA Fall from non-moving wheelchair, initial encounter: Secondary | ICD-10-CM | POA: Diagnosis not present

## 2015-02-28 DIAGNOSIS — E119 Type 2 diabetes mellitus without complications: Secondary | ICD-10-CM | POA: Insufficient documentation

## 2015-02-28 DIAGNOSIS — Z8673 Personal history of transient ischemic attack (TIA), and cerebral infarction without residual deficits: Secondary | ICD-10-CM | POA: Diagnosis not present

## 2015-02-28 DIAGNOSIS — Y998 Other external cause status: Secondary | ICD-10-CM | POA: Diagnosis not present

## 2015-02-28 DIAGNOSIS — S0990XA Unspecified injury of head, initial encounter: Secondary | ICD-10-CM | POA: Diagnosis not present

## 2015-02-28 LAB — BASIC METABOLIC PANEL
Anion gap: 8 (ref 5–15)
BUN: 28 mg/dL — ABNORMAL HIGH (ref 6–20)
CO2: 29 mmol/L (ref 22–32)
Calcium: 9.8 mg/dL (ref 8.9–10.3)
Chloride: 102 mmol/L (ref 101–111)
Creatinine, Ser: 1.52 mg/dL — ABNORMAL HIGH (ref 0.61–1.24)
GFR calc Af Amer: 49 mL/min — ABNORMAL LOW (ref >60)
GFR calc non Af Amer: 42 mL/min — ABNORMAL LOW (ref >60)
Glucose, Bld: 93 mg/dL (ref 65–99)
Potassium: 4.3 mmol/L (ref 3.5–5.1)
Sodium: 139 mmol/L (ref 135–145)

## 2015-02-28 LAB — CBG MONITORING, ED: Glucose-Capillary: 107 mg/dL — ABNORMAL HIGH (ref 65–99)

## 2015-02-28 LAB — CBC
HCT: 41.8 % (ref 39.0–52.0)
Hemoglobin: 13.7 g/dL (ref 13.0–17.0)
MCH: 31.9 pg (ref 26.0–34.0)
MCHC: 32.8 g/dL (ref 30.0–36.0)
MCV: 97.4 fL (ref 78.0–100.0)
Platelets: 334 10*3/uL (ref 150–400)
RBC: 4.29 MIL/uL (ref 4.22–5.81)
RDW: 13.4 % (ref 11.5–15.5)
WBC: 11.4 10*3/uL — ABNORMAL HIGH (ref 4.0–10.5)

## 2015-02-28 LAB — TROPONIN I: Troponin I: <0.03 ng/mL (ref <0.031)

## 2015-02-28 NOTE — Discharge Instructions (Signed)

## 2015-02-28 NOTE — ED Notes (Signed)
[facility] [place], pt son, [phone].
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
## 2015-02-28 NOTE — ED Notes (Signed)
MD at bedside. 

## 2015-03-01 DIAGNOSIS — Z7401 Bed confinement status: Secondary | ICD-10-CM | POA: Diagnosis not present

## 2015-03-01 DIAGNOSIS — R404 Transient alteration of awareness: Secondary | ICD-10-CM | POA: Diagnosis not present

## 2015-03-11 NOTE — ED Provider Notes (Signed)
CSN: [id number]     Arrival date & time 02/28/15  1431 History   First MD Initiated Contact with Patient 02/28/15 1456     Chief Complaint  Patient presents with  . Loss of Consciousness     (Consider location/radiation/quality/duration/timing/severity/associated sxs/prior Treatment) HPI   80yM sp fall from wheelchair. Slumped forward and "woke up" when he hit the ground. Unwitnessed. Pt not sure if fell asleep or passed out. Did strike head but but acute pain complaints. Did have CP earlier in the day which apparently he gets sometimes. Given isordil and pain went away. hasnt had against since. No blood thinners.   Past Medical History  Diagnosis Date  . Diabetes mellitus without complication ([place])   . Hypertension   . Arthritis   . COPD (chronic obstructive pulmonary disease) ([place])   . Cancer ([place])     melanoma  . Atrial fibrillation ([place])   . GERD (gastroesophageal reflux disease)   . CAD (coronary artery disease)     Stents to unknown arteries in 2006,2009  . CVA (cerebral infarction) [id number]    Embolic Left sided hemiplegia.   . Ischemic cardiomyopathy     EF of 40-45%   Past Surgical History  Procedure Laterality Date  . Coronary stent placement    . Cardiac surgery    . Brain surgery    . Back surgery    . Splenectomy     Family History  Problem Relation Age of Onset  . Heart attack Father    Social History  Substance Use Topics  . Smoking status: Former Smoker    Quit date: 03/22/2010  . Smokeless tobacco: Never Used  . Alcohol Use: No    Review of Systems  All systems reviewed and negative, other than as noted in HPI.   Allergies  Review of patient's allergies indicates no known allergies.  Home Medications   Prior to Admission medications   Medication Sig Start Date End Date Taking? Authorizing Provider  albuterol (PROVENTIL HFA;VENTOLIN HFA) 108 (90 BASE) MCG/ACT inhaler Inhale 1-2 puffs into the lungs every 6 (six) hours as needed for wheezing  or shortness of breath. 06/16/13  Yes Carmin Muskrat, MD  Amino Acids-Protein Hydrolys (FEEDING SUPPLEMENT, PRO-STAT SUGAR FREE 64,) LIQD Take 30 mLs by mouth 2 (two) times daily.   Yes Historical Provider, MD  apixaban (ELIQUIS) 5 MG TABS tablet 5 mg by PEG Tube route 2 (two) times daily.   Yes Historical Provider, MD  aspirin EC 81 MG tablet 81 mg by PEG Tube route daily.   Yes Historical Provider, MD  cholecalciferol (VITAMIN D) 1000 UNITS tablet 1,000 Units by PEG Tube route daily.    Yes Historical Provider, MD  dofetilide (TIKOSYN) 250 MCG capsule Take 250 mg Two Times Daily via Peg Tube Patient taking differently: 250 mcg by PEG Tube route 2 (two) times daily.  11/21/14  Yes Evans Lance, MD  ENSURE (ENSURE) Take 237 mLs by mouth 3 (three) times daily.   Yes Historical Provider, MD  famotidine (PEPCID) 20 MG tablet 20 mg by PEG Tube route 2 (two) times daily.   Yes Historical Provider, MD  ferrous sulfate 325 (65 FE) MG tablet Take 325 mg by mouth 2 (two) times daily with a meal.   Yes Historical Provider, MD  Fluticasone Furoate-Vilanterol (BREO ELLIPTA) 100-25 MCG/INH AEPB Inhale 1 puff into the lungs daily.   Yes Historical Provider, MD  glipiZIDE (GLUCOTROL) 5 MG tablet 5 mg by PEG Tube route daily  before breakfast.   Yes Historical Provider, MD  HYDROcodone-acetaminophen (NORCO/VICODIN) 5-325 MG per tablet 1 tablet by PEG Tube route every 6 (six) hours as needed for moderate pain.   Yes Historical Provider, MD  isosorbide dinitrate (ISORDIL) 30 MG tablet 30 mg by PEG Tube route 3 (three) times daily.   Yes Historical Provider, MD  levothyroxine (SYNTHROID, LEVOTHROID) 137 MCG tablet Take 137 mcg by mouth daily before breakfast.   Yes Historical Provider, MD  Melatonin 3 MG TABS Take 3 mg by mouth at bedtime.   Yes Historical Provider, MD  metoprolol (LOPRESSOR) 100 MG tablet 100 mg by PEG Tube route 2 (two) times daily.   Yes Historical Provider, MD  NON FORMULARY by PEG Tube route at  bedtime. MAGIC CUP   Yes Historical Provider, MD  Nutritional Supplements (FEEDING SUPPLEMENT, JEVITY 1.5 CAL,) LIQD Place 120 mLs into feeding tube 3 (three) times daily.   Yes Historical Provider, MD  omeprazole (PRILOSEC) 20 MG capsule Take 20 mg by mouth 2 (two) times daily before a meal.   Yes Historical Provider, MD  OXYGEN Inhale 2 L into the lungs daily.   Yes Historical Provider, MD  PARoxetine (PAXIL) 40 MG tablet Take 40 mg by mouth every evening.   Yes Historical Provider, MD  polyethylene glycol (MIRALAX / GLYCOLAX) packet 17 g by PEG Tube route daily.   Yes Historical Provider, MD  pravastatin (PRAVACHOL) 40 MG tablet 40 mg by PEG Tube route at bedtime.   Yes Historical Provider, MD  traZODone (DESYREL) 100 MG tablet 100 mg by PEG Tube route at bedtime as needed for sleep.   Yes Historical Provider, MD   BP 135/57 mmHg  Pulse 81  Temp(Src) 97.9 F (36.6 C) (Oral)  Resp 16  SpO2 97% Physical Exam  Constitutional: He appears well-developed and well-nourished. No distress.  HENT:  Head: Normocephalic and atraumatic.  Eyes: Conjunctivae are normal. Right eye exhibits no discharge. Left eye exhibits no discharge.  Neck: Neck supple.  Cardiovascular: Normal rate, regular rhythm and normal heart sounds.  Exam reveals no gallop and no friction rub.   No murmur heard. Pulmonary/Chest: Effort normal and breath sounds normal. No respiratory distress.  Abdominal: Soft. He exhibits no distension. There is no tenderness.  Musculoskeletal: He exhibits no edema or tenderness.  No midline spinal tenderness  Neurological: He is alert.  Skin: Skin is warm and dry.  Psychiatric: He has a normal mood and affect. His behavior is normal.  Nursing note and vitals reviewed.   ED Course  Procedures (including critical care time) Labs Review Labs Reviewed  BASIC METABOLIC PANEL - Abnormal; Notable for the following:    BUN 28 (*)    Creatinine, Ser 1.52 (*)    GFR calc non Af Amer 42 (*)     GFR calc Af Amer 49 (*)    All other components within normal limits  CBC - Abnormal; Notable for the following:    WBC 11.4 (*)    All other components within normal limits  CBG MONITORING, ED - Abnormal; Notable for the following:    Glucose-Capillary 107 (*)    All other components within normal limits  TROPONIN I    Imaging Review No results found. I have personally reviewed and evaluated these images and lab results as part of my medical decision-making.   EKG Interpretation   Date/Time:  Friday February 28 2015 14:42:07 EST Ventricular Rate:  54 PR Interval:  192 QRS Duration: 101 QT  Interval:  463 QTC Calculation: 439 R Axis:   -28 Text Interpretation:  Sinus rhythm Low voltage, extremity and precordial  leads No significant change was found Confirmed by Wyvonnia Dusky  MD, Kameisha Malicki  [phone]) on 02/28/2015 2:52:10 PM      MDM   Final diagnoses:  Syncope and collapse    80yM with fall from wheelchair. Possible syncope? Unwitnessed event. Now no significant acute complaints. W/u fairly unremarkable. It has been determined that no acute conditions requiring further emergency intervention are present at this time. The patient has been advised of the diagnosis and plan. I reviewed any labs and imaging including any potential incidental findings. We have discussed signs and symptoms that warrant return to the ED and they are listed in the discharge instructions.      Virgel Manifold, MD 03/11/15 1224

## 2015-03-13 ENCOUNTER — Encounter: Payer: Self-pay | Admitting: Gastroenterology

## 2015-03-13 ENCOUNTER — Other Ambulatory Visit: Payer: Self-pay

## 2015-03-13 ENCOUNTER — Ambulatory Visit (INDEPENDENT_AMBULATORY_CARE_PROVIDER_SITE_OTHER): Payer: Medicare Other | Admitting: Gastroenterology

## 2015-03-13 VITALS — BP 121/52 | HR 55 | Temp 96.5°F | Ht 73.0 in

## 2015-03-13 DIAGNOSIS — Z931 Gastrostomy status: Secondary | ICD-10-CM | POA: Diagnosis not present

## 2015-03-13 DIAGNOSIS — T85848S Pain due to other internal prosthetic devices, implants and grafts, sequela: Secondary | ICD-10-CM

## 2015-03-13 DIAGNOSIS — T85848A Pain due to other internal prosthetic devices, implants and grafts, initial encounter: Secondary | ICD-10-CM

## 2015-03-13 NOTE — Assessment & Plan Note (Signed)
PT TOLERATING POS. WANTS PEG REMOVED DUE TO DISCOMFORT.  DISCUSSED PROCEDURE, BENEFITS, & RISKS: WITH PATIENT. LEFT VOICEMAIL FOR DAUGHTER, TERRY, TO CALL WITH QUESTIONS. IF BUMPER BREAKS OFF PT WILL NEED EGD TO REMOVE BUMPER. PATIENT VOICED HIS UNDERSTANDING. REMOVE PEG NEXT WED. HOLD ELIQUIS AFTER LAST DOSE MON. NPO AFTER MN TUES

## 2015-03-13 NOTE — Assessment & Plan Note (Addendum)
MOST LIKELY DUE TO PRESSURE IN THE TRACT.  REMOVE PEG NEXT WED. HOLD ELIQUIS AFTER LAST DOSE MON.

## 2015-03-13 NOTE — Patient Instructions (Addendum)
YOUR ELIQUIS SHOULD BE STOPPED AFTER YOUR LAST DOSE ON MON DEC 26.  NOTHING TO EAT OR DRINK AFTER MIDNIGHT TUES. YO MAY HAVE SIPS WITH YOU MORNING MEDS.  HOLD GLUCOTROL ON DEC 28 & I WILL REMOVE YOUR FEEDING TUBE WED DEC 28.

## 2015-03-13 NOTE — Progress Notes (Signed)
cc'ed to pcp °

## 2015-03-13 NOTE — Progress Notes (Signed)
Subjective:    Patient ID: Alejandro Diaz, male    DOB: 10/19/1934, 80 y.o.   MRN: [id number] Jani Gravel, MD  HPI Pt had feeding tube placed FEB 2016. NOW TOLERATING SOFT MECHANICAL DIET. TUBE HURTS. PT HAD STROKE BACK IN FEB 2016 AND TAKING ELIQUIS. DAUGHTER TERRY IS LISTED AT EMERGENCY CONTACT #1.   Past Medical History  Diagnosis Date  . Diabetes mellitus without complication ([place])   . Hypertension   . Arthritis   . COPD (chronic obstructive pulmonary disease) ([place])   . Cancer ([place])     melanoma  . Atrial fibrillation ([place])   . GERD (gastroesophageal reflux disease)   . CAD (coronary artery disease)     Stents to unknown arteries in 2006,2009  . CVA (cerebral infarction) [id number]    Embolic Left sided hemiplegia.   . Ischemic cardiomyopathy     EF of 40-45%   Past Surgical History  Procedure Laterality Date  . Coronary stent placement    . Cardiac surgery    . Brain surgery    . Back surgery    . Splenectomy      No Known Allergies  Current Outpatient Prescriptions  Medication Sig Dispense Refill  . albuterol (PROVENTIL HFA;VENTOLIN HFA) 108 (90 BASE) MCG/ACT inhaler Inhale 1-2 puffs into the lungs every 6 (six) hours as needed for wheezing or shortness of breath. 1 Inhaler 0  . Amino Acids-Protein Hydrolys (FEEDING SUPPLEMENT, PRO-STAT SUGAR FREE 64,) LIQD Take 30 mLs by mouth 2 (two) times daily.    Marland Kitchen apixaban (ELIQUIS) 5 MG TABS tablet 5 mg by PEG Tube route 2 (two) times daily.    Marland Kitchen aspirin EC 81 MG tablet 81 mg by PEG Tube route daily.    . cholecalciferol (VITAMIN D) 1000 UNITS tablet 1,000 Units by PEG Tube route daily.     Marland Kitchen dofetilide (TIKOSYN) 250 MCG capsule Take 250 mg Two Times Daily via Peg Tube (Patient taking differently: 250 mcg by PEG Tube route 2 (two) times daily. ) 60 capsule 11  . ENSURE (ENSURE) Take 237 mLs by mouth 3 (three) times daily.    . famotidine (PEPCID) 20 MG tablet 20 mg by PEG Tube route 2 (two) times daily.    . ferrous sulfate 325  (65 FE) MG tablet Take 325 mg by mouth 2 (two) times daily with a meal.    . Fluticasone Furoate-Vilanterol (BREO ELLIPTA) 100-25 MCG/INH AEPB Inhale 1 puff into the lungs daily.    Marland Kitchen glipiZIDE (GLUCOTROL) 5 MG tablet 5 mg by PEG Tube route daily before breakfast.    . HYDROcodone-acetaminophen (NORCO/VICODIN) 5-325 MG per tablet 1 tablet by PEG Tube route every 6 (six) hours as needed for moderate pain.    . isosorbide dinitrate (ISORDIL) 30 MG tablet 30 mg by PEG Tube route 3 (three) times daily.    Marland Kitchen levothyroxine (SYNTHROID, LEVOTHROID) 137 MCG tablet Take 137 mcg by mouth daily before breakfast.    . Melatonin 3 MG TABS Take 3 mg by mouth at bedtime.    . metoprolol (LOPRESSOR) 100 MG tablet 100 mg by PEG Tube route 2 (two) times daily.    . NON FORMULARY by PEG Tube route at bedtime. MAGIC CUP    . Nutritional Supplements (FEEDING SUPPLEMENT, JEVITY 1.5 CAL,) LIQD Place 120 mLs into feeding tube 3 (three) times daily.    Marland Kitchen omeprazole (PRILOSEC) 20 MG capsule Take 20 mg by mouth 2 (two) times daily before a meal.    .  OXYGEN Inhale 2 L into the lungs daily.    Marland Kitchen PARoxetine (PAXIL) 40 MG tablet Take 40 mg by mouth every evening.    . polyethylene glycol (MIRALAX / GLYCOLAX) packet 17 g by PEG Tube route daily.    . pravastatin (PRAVACHOL) 40 MG tablet 40 mg by PEG Tube route at bedtime.    . traZODone (DESYREL) 100 MG tablet 100 mg by PEG Tube route at bedtime as needed for sleep.     [facility]  Current Outpatient Prescriptions  Medication Sig Dispense Refill  . albuterol (PROVENTIL HFA;VENTOLIN HFA) 108 (90 BASE) MCG/ACT inhaler Inhale 1-2 puffs into the lungs every 6 (six) hours as needed for wheezing or shortness of breath. 1 Inhaler 0  . Amino Acids-Protein Hydrolys (FEEDING SUPPLEMENT, PRO-STAT SUGAR FREE 64,) LIQD Take 30 mLs by mouth 2 (two) times daily.    Marland Kitchen apixaban (ELIQUIS) 5 MG TABS tablet 5 mg by PEG Tube route 2 (two) times daily.    Marland Kitchen aspirin EC 81 MG tablet 81 mg by PEG  Tube route daily.    . cholecalciferol (VITAMIN D) 1000 UNITS tablet 1,000 Units by PEG Tube route daily.     Marland Kitchen dofetilide (TIKOSYN) 250 MCG capsule Take 250 mg Two Times Daily via Peg Tube (Patient taking differently: 250 mcg by PEG Tube route 2 (two) times daily. ) 60 capsule 11  . ENSURE (ENSURE) Take 237 mLs by mouth 3 (three) times daily.    . famotidine (PEPCID) 20 MG tablet 20 mg by PEG Tube route 2 (two) times daily.    . ferrous sulfate 325 (65 FE) MG tablet Take 325 mg by mouth 2 (two) times daily with a meal.    . Fluticasone Furoate-Vilanterol (BREO ELLIPTA) 100-25 MCG/INH AEPB Inhale 1 puff into the lungs daily.    Marland Kitchen glipiZIDE (GLUCOTROL) 5 MG tablet 5 mg by PEG Tube route daily before breakfast.    . HYDROcodone-acetaminophen (NORCO/VICODIN) 5-325 MG per tablet 1 tablet by PEG Tube route every 6 (six) hours as needed for moderate pain.    . isosorbide dinitrate (ISORDIL) 30 MG tablet 30 mg by PEG Tube route 3 (three) times daily.    Marland Kitchen levothyroxine (SYNTHROID, LEVOTHROID) 137 MCG tablet Take 137 mcg by mouth daily before breakfast.    . Melatonin 3 MG TABS Take 3 mg by mouth at bedtime.    . metoprolol (LOPRESSOR) 100 MG tablet 100 mg by PEG Tube route 2 (two) times daily.    . NON FORMULARY by PEG Tube route at bedtime. MAGIC CUP    . Nutritional Supplements (FEEDING SUPPLEMENT, JEVITY 1.5 CAL,) LIQD Place 120 mLs into feeding tube 3 (three) times daily.    Marland Kitchen omeprazole (PRILOSEC) 20 MG capsule Take 20 mg by mouth 2 (two) times daily before a meal.    . OXYGEN Inhale 2 L into the lungs daily.    Marland Kitchen PARoxetine (PAXIL) 40 MG tablet Take 40 mg by mouth every evening.    . polyethylene glycol (MIRALAX / GLYCOLAX) packet 17 g by PEG Tube route daily.    . pravastatin (PRAVACHOL) 40 MG tablet 40 mg by PEG Tube route at bedtime.    . traZODone (DESYREL) 100 MG tablet 100 mg by PEG Tube route at bedtime as needed for sleep.     Family History  Problem Relation Age of Onset  . Heart  attack Father     Social History  Substance Use Topics  . Smoking status: Former Smoker  Quit date: 03/22/2010  . Smokeless tobacco: Never Used  . Alcohol Use: No   USED TO BE A DEMOLITIONS EXPERT. BIGGEST BUILDING HE BROUGHT DOWN WAS A FERTILIZER PLANT IN [place]. Review of Systems PER HPI OTHERWISE ALL SYSTEMS ARE NEGATIVE.    Objective:   Physical Exam  Constitutional: He is oriented to person, place, and time. He appears well-developed and well-nourished. No distress.  HENT:  Head: Normocephalic and atraumatic.  Mouth/Throat: Oropharynx is clear and moist. No oropharyngeal exudate.  Eyes: Pupils are equal, round, and reactive to light. No scleral icterus.  Neck: Normal range of motion. Neck supple.  Cardiovascular: Normal rate, regular rhythm and normal heart sounds.   Pulmonary/Chest: Effort normal and breath sounds normal. No respiratory distress.  Abdominal: Soft. Bowel sounds are normal. He exhibits no distension. There is no tenderness.  Musculoskeletal: He exhibits no edema.  Lymphadenopathy:    He has no cervical adenopathy.  Neurological: He is alert and oriented to person, place, and time.  NO  NEW FOCAL DEFICITS. L HEMIPARALYSIS  Psychiatric:  FLAT AFFECT, NL MOOD  Vitals reviewed.     Assessment & Plan:

## 2015-03-18 DIAGNOSIS — I6350 Cerebral infarction due to unspecified occlusion or stenosis of unspecified cerebral artery: Secondary | ICD-10-CM | POA: Diagnosis not present

## 2015-03-18 DIAGNOSIS — R4182 Altered mental status, unspecified: Secondary | ICD-10-CM | POA: Diagnosis not present

## 2015-03-18 DIAGNOSIS — G8190 Hemiplegia, unspecified affecting unspecified side: Secondary | ICD-10-CM | POA: Diagnosis not present

## 2015-03-18 DIAGNOSIS — J449 Chronic obstructive pulmonary disease, unspecified: Secondary | ICD-10-CM | POA: Diagnosis not present

## 2015-03-19 ENCOUNTER — Encounter (HOSPITAL_COMMUNITY)
Admission: RE | Disposition: A | Discharge status: Skilled Nursing Facility | Payer: Self-pay | Source: Ambulatory Visit | Attending: Gastroenterology

## 2015-03-19 ENCOUNTER — Ambulatory Visit (HOSPITAL_COMMUNITY)
Admission: RE | Admit: 2015-03-19 | Discharge: 2015-03-19 | Disposition: A | Discharge status: Skilled Nursing Facility | Payer: Medicare Other | Source: Ambulatory Visit | Attending: Gastroenterology | Admitting: Gastroenterology

## 2015-03-19 DIAGNOSIS — I10 Essential (primary) hypertension: Secondary | ICD-10-CM | POA: Diagnosis not present

## 2015-03-19 DIAGNOSIS — Z431 Encounter for attention to gastrostomy: Secondary | ICD-10-CM | POA: Diagnosis not present

## 2015-03-19 DIAGNOSIS — N390 Urinary tract infection, site not specified: Secondary | ICD-10-CM | POA: Diagnosis not present

## 2015-03-19 DIAGNOSIS — I6350 Cerebral infarction due to unspecified occlusion or stenosis of unspecified cerebral artery: Secondary | ICD-10-CM | POA: Diagnosis not present

## 2015-03-19 DIAGNOSIS — J449 Chronic obstructive pulmonary disease, unspecified: Secondary | ICD-10-CM | POA: Diagnosis not present

## 2015-03-19 DIAGNOSIS — R4182 Altered mental status, unspecified: Secondary | ICD-10-CM | POA: Diagnosis not present

## 2015-03-19 DIAGNOSIS — G8190 Hemiplegia, unspecified affecting unspecified side: Secondary | ICD-10-CM | POA: Diagnosis not present

## 2015-03-19 HISTORY — PX: PERCUTANEOUS ENDOSCOPIC GASTROSTOMY (PEG) REMOVAL: SHX6018

## 2015-03-19 SURGERY — REMOVAL, PEG TUBE, ENDOSCOPIC

## 2015-03-19 NOTE — Progress Notes (Signed)
Dr. Oneida Alar removed PEG tube without difficulty. Patient tolerated procedure without difficulty.

## 2015-03-20 DIAGNOSIS — I10 Essential (primary) hypertension: Secondary | ICD-10-CM | POA: Diagnosis not present

## 2015-03-20 DIAGNOSIS — E079 Disorder of thyroid, unspecified: Secondary | ICD-10-CM | POA: Diagnosis not present

## 2015-03-20 DIAGNOSIS — D649 Anemia, unspecified: Secondary | ICD-10-CM | POA: Diagnosis not present

## 2015-03-20 DIAGNOSIS — D72829 Elevated white blood cell count, unspecified: Secondary | ICD-10-CM | POA: Diagnosis not present

## 2015-03-21 DIAGNOSIS — R4182 Altered mental status, unspecified: Secondary | ICD-10-CM | POA: Diagnosis not present

## 2015-03-25 ENCOUNTER — Encounter (HOSPITAL_COMMUNITY): Payer: Self-pay | Admitting: Gastroenterology

## 2015-03-27 NOTE — Brief Op Note (Signed)
03/19/2015  11:43 AM  PATIENT:  Blossom Hoops Ingber  81 y.o. male  PRE-OPERATIVE DIAGNOSIS:  pain around PEG  POST-OPERATIVE DIAGNOSIS:  DYSPHAGIA, TOLERATING POs  PROCEDURE:  Procedure(s) with comments: PERCUTANEOUS ENDOSCOPIC GASTROSTOMY (PEG) REMOVAL (N/A) - 1200  SURGEON:  Surgeon(s) and Role:    * Danie Binder, MD - Primary  PROCEDURE TECHNIQUE:  PT PREPPED AND DRAPED. Traction applied. 20 fr BUMPER PEG REMOVED.    PLAN:  KEEP DRY DRESSING OR BAND-AID ON PEG SITE UNTIL SKIN HEALED.  PLEASE CALL WITH QUESTIONS OR CONCERNS.

## 2015-04-11 DIAGNOSIS — D649 Anemia, unspecified: Secondary | ICD-10-CM | POA: Diagnosis not present

## 2015-04-11 DIAGNOSIS — I10 Essential (primary) hypertension: Secondary | ICD-10-CM | POA: Diagnosis not present

## 2015-04-11 DIAGNOSIS — N390 Urinary tract infection, site not specified: Secondary | ICD-10-CM | POA: Diagnosis not present

## 2015-04-21 DIAGNOSIS — I10 Essential (primary) hypertension: Secondary | ICD-10-CM | POA: Diagnosis not present

## 2015-04-21 DIAGNOSIS — D649 Anemia, unspecified: Secondary | ICD-10-CM | POA: Diagnosis not present

## 2015-04-21 DIAGNOSIS — D72829 Elevated white blood cell count, unspecified: Secondary | ICD-10-CM | POA: Diagnosis not present

## 2015-04-22 DIAGNOSIS — D72829 Elevated white blood cell count, unspecified: Secondary | ICD-10-CM | POA: Diagnosis not present

## 2015-04-24 DIAGNOSIS — R4182 Altered mental status, unspecified: Secondary | ICD-10-CM | POA: Diagnosis not present

## 2015-04-28 DIAGNOSIS — D72829 Elevated white blood cell count, unspecified: Secondary | ICD-10-CM | POA: Diagnosis not present

## 2015-04-28 DIAGNOSIS — R4182 Altered mental status, unspecified: Secondary | ICD-10-CM | POA: Diagnosis not present

## 2015-05-12 DIAGNOSIS — R300 Dysuria: Secondary | ICD-10-CM | POA: Diagnosis not present

## 2015-05-12 DIAGNOSIS — R4182 Altered mental status, unspecified: Secondary | ICD-10-CM | POA: Diagnosis not present

## 2015-05-13 DIAGNOSIS — N390 Urinary tract infection, site not specified: Secondary | ICD-10-CM | POA: Diagnosis not present

## 2015-05-15 DIAGNOSIS — J984 Other disorders of lung: Secondary | ICD-10-CM | POA: Diagnosis not present

## 2015-05-15 DIAGNOSIS — J189 Pneumonia, unspecified organism: Secondary | ICD-10-CM | POA: Diagnosis not present

## 2015-05-15 DIAGNOSIS — R4182 Altered mental status, unspecified: Secondary | ICD-10-CM | POA: Diagnosis not present

## 2015-05-15 DIAGNOSIS — D72829 Elevated white blood cell count, unspecified: Secondary | ICD-10-CM | POA: Diagnosis not present

## 2015-05-19 DIAGNOSIS — D72829 Elevated white blood cell count, unspecified: Secondary | ICD-10-CM | POA: Diagnosis not present

## 2015-05-19 DIAGNOSIS — D649 Anemia, unspecified: Secondary | ICD-10-CM | POA: Diagnosis not present

## 2015-05-21 DIAGNOSIS — D72829 Elevated white blood cell count, unspecified: Secondary | ICD-10-CM | POA: Diagnosis not present

## 2015-05-22 DIAGNOSIS — J189 Pneumonia, unspecified organism: Secondary | ICD-10-CM | POA: Diagnosis not present

## 2015-05-26 DIAGNOSIS — D649 Anemia, unspecified: Secondary | ICD-10-CM | POA: Diagnosis not present

## 2015-05-26 DIAGNOSIS — R4182 Altered mental status, unspecified: Secondary | ICD-10-CM | POA: Diagnosis not present

## 2015-05-26 DIAGNOSIS — D72829 Elevated white blood cell count, unspecified: Secondary | ICD-10-CM | POA: Diagnosis not present

## 2015-06-02 DIAGNOSIS — D72829 Elevated white blood cell count, unspecified: Secondary | ICD-10-CM | POA: Diagnosis not present

## 2015-06-02 DIAGNOSIS — R4182 Altered mental status, unspecified: Secondary | ICD-10-CM | POA: Diagnosis not present

## 2015-06-09 DIAGNOSIS — R197 Diarrhea, unspecified: Secondary | ICD-10-CM | POA: Diagnosis not present

## 2015-06-09 DIAGNOSIS — D649 Anemia, unspecified: Secondary | ICD-10-CM | POA: Diagnosis not present

## 2015-06-09 DIAGNOSIS — D72829 Elevated white blood cell count, unspecified: Secondary | ICD-10-CM | POA: Diagnosis not present

## 2015-06-09 DIAGNOSIS — R4182 Altered mental status, unspecified: Secondary | ICD-10-CM | POA: Diagnosis not present

## 2015-06-16 ENCOUNTER — Encounter (HOSPITAL_COMMUNITY): Payer: Self-pay

## 2015-06-16 DIAGNOSIS — M2550 Pain in unspecified joint: Secondary | ICD-10-CM | POA: Diagnosis not present

## 2015-06-16 DIAGNOSIS — D72829 Elevated white blood cell count, unspecified: Secondary | ICD-10-CM | POA: Diagnosis not present

## 2015-06-16 DIAGNOSIS — R4182 Altered mental status, unspecified: Secondary | ICD-10-CM | POA: Diagnosis not present

## 2015-06-23 DIAGNOSIS — D72829 Elevated white blood cell count, unspecified: Secondary | ICD-10-CM | POA: Diagnosis not present

## 2015-06-23 DIAGNOSIS — R4182 Altered mental status, unspecified: Secondary | ICD-10-CM | POA: Diagnosis not present

## 2015-06-23 DIAGNOSIS — M2550 Pain in unspecified joint: Secondary | ICD-10-CM | POA: Diagnosis not present

## 2015-06-26 DIAGNOSIS — R4182 Altered mental status, unspecified: Secondary | ICD-10-CM | POA: Diagnosis not present

## 2015-06-26 DIAGNOSIS — M2550 Pain in unspecified joint: Secondary | ICD-10-CM | POA: Diagnosis not present

## 2015-06-26 DIAGNOSIS — D72829 Elevated white blood cell count, unspecified: Secondary | ICD-10-CM | POA: Diagnosis not present

## 2015-06-30 DIAGNOSIS — J189 Pneumonia, unspecified organism: Secondary | ICD-10-CM | POA: Diagnosis not present

## 2015-06-30 DIAGNOSIS — D72829 Elevated white blood cell count, unspecified: Secondary | ICD-10-CM | POA: Diagnosis not present

## 2015-06-30 DIAGNOSIS — R4182 Altered mental status, unspecified: Secondary | ICD-10-CM | POA: Diagnosis not present

## 2015-06-30 DIAGNOSIS — M2550 Pain in unspecified joint: Secondary | ICD-10-CM | POA: Diagnosis not present

## 2015-07-07 DIAGNOSIS — M2550 Pain in unspecified joint: Secondary | ICD-10-CM | POA: Diagnosis not present

## 2015-07-07 DIAGNOSIS — R4182 Altered mental status, unspecified: Secondary | ICD-10-CM | POA: Diagnosis not present

## 2015-07-07 DIAGNOSIS — D72829 Elevated white blood cell count, unspecified: Secondary | ICD-10-CM | POA: Diagnosis not present

## 2015-07-14 DIAGNOSIS — D72829 Elevated white blood cell count, unspecified: Secondary | ICD-10-CM | POA: Diagnosis not present

## 2015-07-14 DIAGNOSIS — R4182 Altered mental status, unspecified: Secondary | ICD-10-CM | POA: Diagnosis not present

## 2015-07-14 DIAGNOSIS — M2550 Pain in unspecified joint: Secondary | ICD-10-CM | POA: Diagnosis not present

## 2015-07-21 DIAGNOSIS — D72829 Elevated white blood cell count, unspecified: Secondary | ICD-10-CM | POA: Diagnosis not present

## 2015-07-21 DIAGNOSIS — R21 Rash and other nonspecific skin eruption: Secondary | ICD-10-CM | POA: Diagnosis not present

## 2015-08-11 DIAGNOSIS — M2550 Pain in unspecified joint: Secondary | ICD-10-CM | POA: Diagnosis not present

## 2015-08-11 DIAGNOSIS — D72829 Elevated white blood cell count, unspecified: Secondary | ICD-10-CM | POA: Diagnosis not present

## 2015-08-11 DIAGNOSIS — R4182 Altered mental status, unspecified: Secondary | ICD-10-CM | POA: Diagnosis not present

## 2015-08-25 DIAGNOSIS — J449 Chronic obstructive pulmonary disease, unspecified: Secondary | ICD-10-CM | POA: Diagnosis not present

## 2015-08-25 DIAGNOSIS — R05 Cough: Secondary | ICD-10-CM | POA: Diagnosis not present

## 2015-08-25 DIAGNOSIS — K219 Gastro-esophageal reflux disease without esophagitis: Secondary | ICD-10-CM | POA: Diagnosis not present

## 2015-08-27 DIAGNOSIS — J189 Pneumonia, unspecified organism: Secondary | ICD-10-CM | POA: Diagnosis not present

## 2015-08-27 DIAGNOSIS — J984 Other disorders of lung: Secondary | ICD-10-CM | POA: Diagnosis not present

## 2015-08-27 DIAGNOSIS — D72829 Elevated white blood cell count, unspecified: Secondary | ICD-10-CM | POA: Diagnosis not present

## 2015-08-27 DIAGNOSIS — R4182 Altered mental status, unspecified: Secondary | ICD-10-CM | POA: Diagnosis not present

## 2015-08-29 ENCOUNTER — Encounter: Payer: Self-pay | Admitting: Internal Medicine

## 2015-08-29 ENCOUNTER — Ambulatory Visit (INDEPENDENT_AMBULATORY_CARE_PROVIDER_SITE_OTHER): Payer: Medicare Other | Admitting: Internal Medicine

## 2015-08-29 VITALS — BP 132/64 | HR 62 | Ht 71.0 in | Wt 198.0 lb

## 2015-08-29 DIAGNOSIS — I4891 Unspecified atrial fibrillation: Secondary | ICD-10-CM | POA: Diagnosis not present

## 2015-08-29 NOTE — Patient Instructions (Signed)
Your physician wants you to follow-up in: 1 Year with Dr. Taylor. You will receive a reminder letter in the mail two months in advance. If you don't receive a letter, please call our office to schedule the follow-up appointment.  Your physician recommends that you continue on your current medications as directed. Please refer to the Current Medication list given to you today.  If you need a refill on your cardiac medications before your next appointment, please call your pharmacy.  Thank you for choosing [facility]!    

## 2015-08-29 NOTE — Progress Notes (Signed)
HPI [name] is returns today for ongoing evaluation of atrial fibrillation and chronic Tikosyn therapy. He is a pleasant widowed man with a h/o atrial fib and stroke, who has moved from [employer] to [facility]. He cannot swallow well and has left sided weakness. He has a mild expressive aphasia. The patient was initially prescribed 500 q12 of Tikosyn and we reduced this when we saw him last. He appears to be maintaining NSR. He has not been hospitalized in the interim. He has rare palpitations. No Known Allergies   Current Outpatient Prescriptions  Medication Sig Dispense Refill  . albuterol (PROVENTIL HFA;VENTOLIN HFA) 108 (90 BASE) MCG/ACT inhaler Inhale 1-2 puffs into the lungs every 6 (six) hours as needed for wheezing or shortness of breath. 1 Inhaler 0  . apixaban (ELIQUIS) 5 MG TABS tablet 5 mg by PEG Tube route 2 (two) times daily.    Marland Kitchen aspirin EC 81 MG tablet 81 mg by PEG Tube route daily.    . cholecalciferol (VITAMIN D) 1000 UNITS tablet 1,000 Units by PEG Tube route daily.     Marland Kitchen dofetilide (TIKOSYN) 250 MCG capsule Take 250 mg Two Times Daily via Peg Tube (Patient taking differently: 250 mcg by PEG Tube route 2 (two) times daily. ) 60 capsule 11  . ENSURE (ENSURE) Take 237 mLs by mouth 3 (three) times daily.    . famotidine (PEPCID) 20 MG tablet 20 mg by PEG Tube route 2 (two) times daily.    . ferrous sulfate 325 (65 FE) MG tablet Take 325 mg by mouth 2 (two) times daily with a meal.    . Fluticasone Furoate-Vilanterol (BREO ELLIPTA) 100-25 MCG/INH AEPB Inhale 1 puff into the lungs daily.    Marland Kitchen glipiZIDE (GLUCOTROL) 5 MG tablet 5 mg by PEG Tube route daily before breakfast.    . HYDROcodone-acetaminophen (NORCO/VICODIN) 5-325 MG per tablet 1 tablet by PEG Tube route every 6 (six) hours as needed for moderate pain.    . isosorbide dinitrate (ISORDIL) 30 MG tablet 30 mg by PEG Tube route 3 (three) times daily.    Marland Kitchen levothyroxine (SYNTHROID, LEVOTHROID) 137 MCG tablet  Take 137 mcg by mouth daily before breakfast.    . Melatonin 3 MG TABS Take 3 mg by mouth at bedtime.    . metoprolol (LOPRESSOR) 100 MG tablet 100 mg by PEG Tube route 2 (two) times daily.    . Nutritional Supplements (FEEDING SUPPLEMENT, JEVITY 1.5 CAL,) LIQD Place 120 mLs into feeding tube 3 (three) times daily.    Marland Kitchen omeprazole (PRILOSEC) 20 MG capsule Take 20 mg by mouth 2 (two) times daily before a meal.    . OXYGEN Inhale 2 L into the lungs daily.    Marland Kitchen PARoxetine (PAXIL) 40 MG tablet Take 40 mg by mouth every evening.    . polyethylene glycol powder (MIRALAX) powder Take 1 Container by mouth once.    . pravastatin (PRAVACHOL) 40 MG tablet 40 mg by PEG Tube route at bedtime.    . traZODone (DESYREL) 100 MG tablet 100 mg by PEG Tube route at bedtime as needed for sleep.     No current facility-administered medications for this visit.     Past Medical History  Diagnosis Date  . Diabetes mellitus without complication ([place])   . Hypertension   . Arthritis   . COPD (chronic obstructive pulmonary disease) ([place])   . Cancer ([place])     melanoma  . Atrial fibrillation ([place])   .  GERD (gastroesophageal reflux disease)   . CAD (coronary artery disease)     Stents to unknown arteries in 2006,2009  . CVA (cerebral infarction) [id number]    Embolic Left sided hemiplegia.   . Ischemic cardiomyopathy     EF of 40-45%    ROS:   All systems reviewed and negative except as noted in the HPI.   Past Surgical History  Procedure Laterality Date  . Coronary stent placement    . Cardiac surgery    . Brain surgery    . Back surgery    . Splenectomy    . Percutaneous endoscopic gastrostomy (peg) removal N/A 03/19/2015    Procedure: PERCUTANEOUS ENDOSCOPIC GASTROSTOMY (PEG) REMOVAL;  Surgeon: Danie Binder, MD;  Location: [facility];  Service: Endoscopy;  Laterality: N/A;  1200     Family History  Problem Relation Age of Onset  . Heart attack Father      Social History   Social History   . Marital Status: Married    Spouse Name: N/A  . Number of Children: N/A  . Years of Education: N/A   Occupational History  . Not on file.   Social History Main Topics  . Smoking status: Former Smoker    Quit date: 03/22/2010  . Smokeless tobacco: Never Used  . Alcohol Use: No  . Drug Use: No  . Sexual Activity: Not on file   Other Topics Concern  . Not on file   Social History Narrative     BP 132/64 mmHg  Pulse 62  Ht 5\' 11"  (1.803 m)  Wt 198 lb (89.812 kg)  BMI 27.63 kg/m2  SpO2 91%  Physical Exam:  Chronically ill appearing 81 yo man, NAD HEENT: Unremarkable Neck:  6 cm JVD, no thyromegally Lymphatics:  No adenopathy Back:  No CVA tenderness Lungs:  Clear with no wheezes HEART:  Regular rate rhythm, no murmurs, no rubs, no clicks Abd:  soft, positive bowel sounds, no organomegally, no rebound, no guarding Ext:  2 plus pulses, no edema, no cyanosis, no clubbing Skin:  No rashes no nodules Neuro:  Dysphasia with dense left HP.  EKG - nsr  Assess/Plan: 1. PAF - he is maintaining NSR on tikosyn. No change in meds. 2. HTN - his blood pressure is well controlled. Will follow. 3. Coags - he is tolerating Eliquis. He is wheel chair bound and has not fallen.  Mikle Bosworth.D.

## 2015-09-10 DIAGNOSIS — D649 Anemia, unspecified: Secondary | ICD-10-CM | POA: Diagnosis not present

## 2015-09-10 DIAGNOSIS — D72829 Elevated white blood cell count, unspecified: Secondary | ICD-10-CM | POA: Diagnosis not present

## 2015-10-08 DIAGNOSIS — D72829 Elevated white blood cell count, unspecified: Secondary | ICD-10-CM | POA: Diagnosis not present

## 2015-10-08 DIAGNOSIS — D649 Anemia, unspecified: Secondary | ICD-10-CM | POA: Diagnosis not present

## 2015-10-09 DIAGNOSIS — J189 Pneumonia, unspecified organism: Secondary | ICD-10-CM | POA: Diagnosis not present

## 2015-10-09 DIAGNOSIS — R05 Cough: Secondary | ICD-10-CM | POA: Diagnosis not present

## 2015-10-09 DIAGNOSIS — R918 Other nonspecific abnormal finding of lung field: Secondary | ICD-10-CM | POA: Diagnosis not present

## 2015-10-09 DIAGNOSIS — J449 Chronic obstructive pulmonary disease, unspecified: Secondary | ICD-10-CM | POA: Diagnosis not present

## 2015-10-10 DIAGNOSIS — R319 Hematuria, unspecified: Secondary | ICD-10-CM | POA: Diagnosis not present

## 2015-10-10 DIAGNOSIS — E079 Disorder of thyroid, unspecified: Secondary | ICD-10-CM | POA: Diagnosis not present

## 2015-10-10 DIAGNOSIS — D649 Anemia, unspecified: Secondary | ICD-10-CM | POA: Diagnosis not present

## 2015-10-10 DIAGNOSIS — D72829 Elevated white blood cell count, unspecified: Secondary | ICD-10-CM | POA: Diagnosis not present

## 2015-10-11 DIAGNOSIS — L89620 Pressure ulcer of left heel, unstageable: Secondary | ICD-10-CM | POA: Diagnosis not present

## 2015-10-13 DIAGNOSIS — D72829 Elevated white blood cell count, unspecified: Secondary | ICD-10-CM | POA: Diagnosis not present

## 2015-10-16 DIAGNOSIS — M2550 Pain in unspecified joint: Secondary | ICD-10-CM | POA: Diagnosis not present

## 2015-10-16 DIAGNOSIS — J189 Pneumonia, unspecified organism: Secondary | ICD-10-CM | POA: Diagnosis not present

## 2015-10-20 DIAGNOSIS — D72829 Elevated white blood cell count, unspecified: Secondary | ICD-10-CM | POA: Diagnosis not present

## 2015-10-20 DIAGNOSIS — J189 Pneumonia, unspecified organism: Secondary | ICD-10-CM | POA: Diagnosis not present

## 2015-10-20 DIAGNOSIS — J449 Chronic obstructive pulmonary disease, unspecified: Secondary | ICD-10-CM | POA: Diagnosis not present

## 2015-10-20 DIAGNOSIS — R7881 Bacteremia: Secondary | ICD-10-CM | POA: Diagnosis not present

## 2015-10-21 DIAGNOSIS — L89611 Pressure ulcer of right heel, stage 1: Secondary | ICD-10-CM | POA: Diagnosis not present

## 2015-10-21 DIAGNOSIS — D72829 Elevated white blood cell count, unspecified: Secondary | ICD-10-CM | POA: Diagnosis not present

## 2015-10-21 DIAGNOSIS — J189 Pneumonia, unspecified organism: Secondary | ICD-10-CM | POA: Diagnosis not present

## 2015-10-21 DIAGNOSIS — M2550 Pain in unspecified joint: Secondary | ICD-10-CM | POA: Diagnosis not present

## 2015-10-23 DIAGNOSIS — R7881 Bacteremia: Secondary | ICD-10-CM | POA: Diagnosis not present

## 2015-10-23 DIAGNOSIS — J449 Chronic obstructive pulmonary disease, unspecified: Secondary | ICD-10-CM | POA: Diagnosis not present

## 2015-10-23 DIAGNOSIS — D72829 Elevated white blood cell count, unspecified: Secondary | ICD-10-CM | POA: Diagnosis not present

## 2015-10-23 DIAGNOSIS — J189 Pneumonia, unspecified organism: Secondary | ICD-10-CM | POA: Diagnosis not present

## 2015-10-24 DIAGNOSIS — D649 Anemia, unspecified: Secondary | ICD-10-CM | POA: Diagnosis not present

## 2015-10-24 DIAGNOSIS — E079 Disorder of thyroid, unspecified: Secondary | ICD-10-CM | POA: Diagnosis not present

## 2015-10-24 DIAGNOSIS — D72829 Elevated white blood cell count, unspecified: Secondary | ICD-10-CM | POA: Diagnosis not present

## 2015-10-24 DIAGNOSIS — I10 Essential (primary) hypertension: Secondary | ICD-10-CM | POA: Diagnosis not present

## 2015-10-27 DIAGNOSIS — M2550 Pain in unspecified joint: Secondary | ICD-10-CM | POA: Diagnosis not present

## 2015-10-27 DIAGNOSIS — R7881 Bacteremia: Secondary | ICD-10-CM | POA: Diagnosis not present

## 2015-10-27 DIAGNOSIS — J189 Pneumonia, unspecified organism: Secondary | ICD-10-CM | POA: Diagnosis not present

## 2015-10-27 DIAGNOSIS — N182 Chronic kidney disease, stage 2 (mild): Secondary | ICD-10-CM | POA: Diagnosis not present

## 2015-10-27 DIAGNOSIS — D72829 Elevated white blood cell count, unspecified: Secondary | ICD-10-CM | POA: Diagnosis not present

## 2015-10-28 DIAGNOSIS — L89620 Pressure ulcer of left heel, unstageable: Secondary | ICD-10-CM | POA: Diagnosis not present

## 2015-10-29 DIAGNOSIS — D72829 Elevated white blood cell count, unspecified: Secondary | ICD-10-CM | POA: Diagnosis not present

## 2015-10-30 DIAGNOSIS — M2550 Pain in unspecified joint: Secondary | ICD-10-CM | POA: Diagnosis not present

## 2015-10-30 DIAGNOSIS — D72829 Elevated white blood cell count, unspecified: Secondary | ICD-10-CM | POA: Diagnosis not present

## 2015-11-03 DIAGNOSIS — M2550 Pain in unspecified joint: Secondary | ICD-10-CM | POA: Diagnosis not present

## 2015-11-03 DIAGNOSIS — D72829 Elevated white blood cell count, unspecified: Secondary | ICD-10-CM | POA: Diagnosis not present

## 2015-11-03 DIAGNOSIS — J189 Pneumonia, unspecified organism: Secondary | ICD-10-CM | POA: Diagnosis not present

## 2015-11-04 ENCOUNTER — Other Ambulatory Visit (HOSPITAL_COMMUNITY): Payer: Self-pay | Admitting: Internal Medicine

## 2015-11-04 DIAGNOSIS — A412 Sepsis due to unspecified staphylococcus: Secondary | ICD-10-CM

## 2015-11-05 DIAGNOSIS — D649 Anemia, unspecified: Secondary | ICD-10-CM | POA: Diagnosis not present

## 2015-11-05 DIAGNOSIS — D72829 Elevated white blood cell count, unspecified: Secondary | ICD-10-CM | POA: Diagnosis not present

## 2015-11-06 DIAGNOSIS — D72829 Elevated white blood cell count, unspecified: Secondary | ICD-10-CM | POA: Diagnosis not present

## 2015-11-06 DIAGNOSIS — R7881 Bacteremia: Secondary | ICD-10-CM | POA: Diagnosis not present

## 2015-11-06 DIAGNOSIS — J449 Chronic obstructive pulmonary disease, unspecified: Secondary | ICD-10-CM | POA: Diagnosis not present

## 2015-11-06 DIAGNOSIS — N182 Chronic kidney disease, stage 2 (mild): Secondary | ICD-10-CM | POA: Diagnosis not present

## 2015-11-12 ENCOUNTER — Ambulatory Visit (HOSPITAL_COMMUNITY)
Admission: RE | Admit: 2015-11-12 | Discharge: 2015-11-12 | Disposition: A | Discharge status: Home / Self Care | Payer: Medicare Other | Source: Ambulatory Visit | Attending: Internal Medicine | Admitting: Internal Medicine

## 2015-11-12 DIAGNOSIS — I352 Nonrheumatic aortic (valve) stenosis with insufficiency: Secondary | ICD-10-CM | POA: Diagnosis not present

## 2015-11-12 DIAGNOSIS — I4891 Unspecified atrial fibrillation: Secondary | ICD-10-CM | POA: Insufficient documentation

## 2015-11-12 DIAGNOSIS — E119 Type 2 diabetes mellitus without complications: Secondary | ICD-10-CM | POA: Diagnosis not present

## 2015-11-12 DIAGNOSIS — I119 Hypertensive heart disease without heart failure: Secondary | ICD-10-CM | POA: Insufficient documentation

## 2015-11-12 DIAGNOSIS — I059 Rheumatic mitral valve disease, unspecified: Secondary | ICD-10-CM | POA: Insufficient documentation

## 2015-11-12 DIAGNOSIS — R079 Chest pain, unspecified: Secondary | ICD-10-CM

## 2015-11-12 DIAGNOSIS — I071 Rheumatic tricuspid insufficiency: Secondary | ICD-10-CM | POA: Insufficient documentation

## 2015-11-12 DIAGNOSIS — A412 Sepsis due to unspecified staphylococcus: Secondary | ICD-10-CM | POA: Diagnosis not present

## 2015-11-12 DIAGNOSIS — R279 Unspecified lack of coordination: Secondary | ICD-10-CM | POA: Diagnosis not present

## 2015-11-12 DIAGNOSIS — Z7401 Bed confinement status: Secondary | ICD-10-CM | POA: Diagnosis not present

## 2015-11-12 NOTE — Progress Notes (Signed)
*  PRELIMINARY RESULTS* Echocardiogram 2D Echocardiogram has been performed.  Alejandro Diaz 11/12/2015, 11:37 AM

## 2015-11-17 DIAGNOSIS — M2550 Pain in unspecified joint: Secondary | ICD-10-CM | POA: Diagnosis not present

## 2015-11-17 DIAGNOSIS — A498 Other bacterial infections of unspecified site: Secondary | ICD-10-CM | POA: Diagnosis not present

## 2015-11-19 DIAGNOSIS — R4182 Altered mental status, unspecified: Secondary | ICD-10-CM | POA: Diagnosis not present

## 2015-11-19 DIAGNOSIS — M2550 Pain in unspecified joint: Secondary | ICD-10-CM | POA: Diagnosis not present

## 2015-11-21 DIAGNOSIS — A498 Other bacterial infections of unspecified site: Secondary | ICD-10-CM | POA: Diagnosis not present

## 2015-11-24 DIAGNOSIS — R7881 Bacteremia: Secondary | ICD-10-CM | POA: Diagnosis not present

## 2015-11-24 DIAGNOSIS — D72829 Elevated white blood cell count, unspecified: Secondary | ICD-10-CM | POA: Diagnosis not present

## 2015-11-24 DIAGNOSIS — L989 Disorder of the skin and subcutaneous tissue, unspecified: Secondary | ICD-10-CM | POA: Diagnosis not present

## 2015-11-24 DIAGNOSIS — R21 Rash and other nonspecific skin eruption: Secondary | ICD-10-CM | POA: Diagnosis not present

## 2015-12-02 ENCOUNTER — Encounter: Payer: Self-pay | Admitting: Infectious Disease

## 2015-12-02 ENCOUNTER — Ambulatory Visit (INDEPENDENT_AMBULATORY_CARE_PROVIDER_SITE_OTHER): Payer: Medicare Other | Admitting: Infectious Disease

## 2015-12-02 VITALS — BP 98/60 | HR 60

## 2015-12-02 DIAGNOSIS — L8990 Pressure ulcer of unspecified site, unspecified stage: Secondary | ICD-10-CM

## 2015-12-02 DIAGNOSIS — R21 Rash and other nonspecific skin eruption: Secondary | ICD-10-CM | POA: Diagnosis not present

## 2015-12-02 DIAGNOSIS — R0789 Other chest pain: Secondary | ICD-10-CM | POA: Insufficient documentation

## 2015-12-02 DIAGNOSIS — K219 Gastro-esophageal reflux disease without esophagitis: Secondary | ICD-10-CM | POA: Insufficient documentation

## 2015-12-02 DIAGNOSIS — B9562 Methicillin resistant Staphylococcus aureus infection as the cause of diseases classified elsewhere: Secondary | ICD-10-CM | POA: Insufficient documentation

## 2015-12-02 DIAGNOSIS — J449 Chronic obstructive pulmonary disease, unspecified: Secondary | ICD-10-CM | POA: Insufficient documentation

## 2015-12-02 DIAGNOSIS — G8194 Hemiplegia, unspecified affecting left nondominant side: Secondary | ICD-10-CM

## 2015-12-02 DIAGNOSIS — J159 Unspecified bacterial pneumonia: Secondary | ICD-10-CM

## 2015-12-02 DIAGNOSIS — R7881 Bacteremia: Secondary | ICD-10-CM | POA: Diagnosis not present

## 2015-12-02 DIAGNOSIS — E079 Disorder of thyroid, unspecified: Secondary | ICD-10-CM | POA: Insufficient documentation

## 2015-12-02 DIAGNOSIS — Z7189 Other specified counseling: Secondary | ICD-10-CM

## 2015-12-02 HISTORY — DX: Hemiplegia, unspecified affecting left nondominant side: G81.94

## 2015-12-02 HISTORY — DX: Other chest pain: R07.89

## 2015-12-02 HISTORY — DX: Rash and other nonspecific skin eruption: R21

## 2015-12-02 HISTORY — DX: Other specified counseling: Z71.89

## 2015-12-02 HISTORY — DX: Bacteremia: R78.81

## 2015-12-02 NOTE — Progress Notes (Signed)
Subjective:   Reason for Consult: MRSA bacteremia  Requesting Physician: Dr. Mal Amabile   Patient ID: Alejandro Diaz, male    DOB: 10-06-1934, 81 y.o.   MRN: [id number]  HPI  81 year old Caucasian man with mx medical problems including CVA with hemiplegia, aortic aneurysm sp placement of stents in 2015 who developed fever at SNF. He was found to have MRSA bacteremia. I do not have record of whether or not he hadblood cultures done to document clearance prior to his having PICC placed. He had 2 d echocardiogram that showed valvular heart disease but no obvious vegetations. He completed 2 weeks of IV vancomycin and also zosyn for one week--not clear why on the latter since if he had PNA it was assuredly all due to his MRSA.   His repeat blood cultures checked for surveillance were negative.   He is here today with his daughter and he shows a fair amount of confabulation and perseverates about various trips he is going to make to [place] and in [place] etc.   He did state that he had chest pain this am that radiated from right to left across his chest and was accompanied by nausea and that he told staff. He had similar symptoms yesterday and was given medications for this possibly nitroglycerin vs an opioid.   Past Medical History:  Diagnosis Date  . Arthritis   . Atrial fibrillation ([place])   . CAD (coronary artery disease)    Stents to unknown arteries in 2006,2009  . Cancer ([place])    melanoma  . COPD (chronic obstructive pulmonary disease) ([place])   . CVA (cerebral infarction) [id number]   Embolic Left sided hemiplegia.   . Diabetes mellitus without complication ([place])   . GERD (gastroesophageal reflux disease)   . Hemiplegia affecting left nondominant side ([place]) 12/02/2015  . Hypertension   . Ischemic cardiomyopathy    EF of 40-45%  . MRSA bacteremia 12/02/2015  . Rash and nonspecific skin eruption 12/02/2015    Past Surgical History:  Procedure Laterality Date  .  BACK SURGERY    . BRAIN SURGERY    . CARDIAC SURGERY    . CORONARY STENT PLACEMENT    . PERCUTANEOUS ENDOSCOPIC GASTROSTOMY (PEG) REMOVAL N/A 03/19/2015   Procedure: PERCUTANEOUS ENDOSCOPIC GASTROSTOMY (PEG) REMOVAL;  Surgeon: Danie Binder, MD;  Location: [facility];  Service: Endoscopy;  Laterality: N/A;  1200  . SPLENECTOMY      Family History  Problem Relation Age of Onset  . Heart attack Father       Social History   Social History  . Marital status: Married    Spouse name: N/A  . Number of children: N/A  . Years of education: N/A   Social History Main Topics  . Smoking status: Former Smoker    Quit date: 03/22/2010  . Smokeless tobacco: Never Used  . Alcohol use No  . Drug use: No  . Sexual activity: Not on file   Other Topics Concern  . Not on file   Social History Narrative  . No narrative on file    No Known Allergies   Current Outpatient Prescriptions:  .  albuterol (PROVENTIL) (2.5 MG/3ML) 0.083% nebulizer solution, Take 3 mLs (2.5 mg total) by nebulization every 6 (six) hours as needed for Wheezing., Disp: , Rfl:  .  apixaban (ELIQUIS) 5 MG TABS tablet, Take by mouth., Disp: , Rfl:  .  cholecalciferol (VITAMIN D) 1000 units  tablet, 1 tablet (1,000 Units total) by Per G Tube route daily., Disp: , Rfl:  .  dofetilide (TIKOSYN) 500 MCG capsule, 1 capsule (500 mcg total) by Per G Tube route 2 (two) times daily., Disp: , Rfl:  .  famotidine (PEPCID) 20 MG tablet, 1 tablet (20 mg total) by Per G Tube route 2 (two) times daily., Disp: , Rfl:  .  ferrous sulfate 300 (60 Fe) MG/5ML syrup, 5 mLs (300 mg total) by Per G Tube route daily., Disp: , Rfl:  .  glipiZIDE (GLUCOTROL XL) 5 MG 24 hr tablet, TAKE 1 TABLET EVERY DAY PER G-TUBE, Disp: , Rfl:  .  albuterol (PROVENTIL HFA;VENTOLIN HFA) 108 (90 BASE) MCG/ACT inhaler, Inhale 1-2 puffs into the lungs every 6 (six) hours as needed for wheezing or shortness of breath., Disp: 1 Inhaler, Rfl: 0 .  apixaban (ELIQUIS) 5  MG TABS tablet, 5 mg by PEG Tube route 2 (two) times daily., Disp: , Rfl:  .  aspirin EC 81 MG tablet, 81 mg by PEG Tube route daily., Disp: , Rfl:  .  cholecalciferol (VITAMIN D) 1000 UNITS tablet, 1,000 Units by PEG Tube route daily. , Disp: , Rfl:  .  dofetilide (TIKOSYN) 250 MCG capsule, Take 250 mg Two Times Daily via Peg Tube (Patient taking differently: 250 mcg by PEG Tube route 2 (two) times daily. ), Disp: 60 capsule, Rfl: 11 .  ENSURE (ENSURE), Take 237 mLs by mouth 3 (three) times daily., Disp: , Rfl:  .  famotidine (PEPCID) 20 MG tablet, 20 mg by PEG Tube route 2 (two) times daily., Disp: , Rfl:  .  ferrous sulfate 325 (65 FE) MG tablet, Take 325 mg by mouth 2 (two) times daily with a meal., Disp: , Rfl:  .  Fluticasone Furoate-Vilanterol (BREO ELLIPTA) 100-25 MCG/INH AEPB, Inhale 1 puff into the lungs daily., Disp: , Rfl:  .  glipiZIDE (GLUCOTROL) 5 MG tablet, 5 mg by PEG Tube route daily before breakfast., Disp: , Rfl:  .  HYDROcodone-acetaminophen (NORCO/VICODIN) 5-325 MG per tablet, 1 tablet by PEG Tube route every 6 (six) hours as needed for moderate pain., Disp: , Rfl:  .  isosorbide dinitrate (ISORDIL) 30 MG tablet, 30 mg by PEG Tube route 3 (three) times daily., Disp: , Rfl:  .  levothyroxine (SYNTHROID, LEVOTHROID) 137 MCG tablet, Take 137 mcg by mouth daily before breakfast., Disp: , Rfl:  .  Melatonin 3 MG TABS, Take 3 mg by mouth at bedtime., Disp: , Rfl:  .  metoprolol (LOPRESSOR) 100 MG tablet, 100 mg by PEG Tube route 2 (two) times daily., Disp: , Rfl:  .  Nutritional Supplements (FEEDING SUPPLEMENT, JEVITY 1.5 CAL,) LIQD, Place 120 mLs into feeding tube 3 (three) times daily., Disp: , Rfl:  .  omeprazole (PRILOSEC) 20 MG capsule, Take 20 mg by mouth 2 (two) times daily before a meal., Disp: , Rfl:  .  OXYGEN, Inhale 2 L into the lungs daily., Disp: , Rfl:  .  PARoxetine (PAXIL) 40 MG tablet, Take 40 mg by mouth every evening., Disp: , Rfl:  .  polyethylene glycol powder  (MIRALAX) powder, Take 1 Container by mouth once., Disp: , Rfl:  .  pravastatin (PRAVACHOL) 40 MG tablet, 40 mg by PEG Tube route at bedtime., Disp: , Rfl:  .  traZODone (DESYREL) 100 MG tablet, 100 mg by PEG Tube route at bedtime as needed for sleep., Disp: , Rfl:    Past Medical History:  Diagnosis Date  .  Arthritis   . Atrial fibrillation ([place])   . CAD (coronary artery disease)    Stents to unknown arteries in 2006,2009  . Cancer ([place])    melanoma  . COPD (chronic obstructive pulmonary disease) ([place])   . CVA (cerebral infarction) [id number]   Embolic Left sided hemiplegia.   . Diabetes mellitus without complication ([place])   . GERD (gastroesophageal reflux disease)   . Hemiplegia affecting left nondominant side ([place]) 12/02/2015  . Hypertension   . Ischemic cardiomyopathy    EF of 40-45%  . MRSA bacteremia 12/02/2015  . Rash and nonspecific skin eruption 12/02/2015    Past Surgical History:  Procedure Laterality Date  . BACK SURGERY    . BRAIN SURGERY    . CARDIAC SURGERY    . CORONARY STENT PLACEMENT    . PERCUTANEOUS ENDOSCOPIC GASTROSTOMY (PEG) REMOVAL N/A 03/19/2015   Procedure: PERCUTANEOUS ENDOSCOPIC GASTROSTOMY (PEG) REMOVAL;  Surgeon: Danie Binder, MD;  Location[facility];  Service: Endoscopy;  Laterality: N/A;  1200  . SPLENECTOMY      Family History  Problem Relation Age of Onset  . Heart attack Father       Social History   Social History  . Marital status: Married    Spouse name: N/A  . Number of children: N/A  . Years of education: N/A   Social History Main Topics  . Smoking status: Former Smoker    Quit date: 03/22/2010  . Smokeless tobacco: Never Used  . Alcohol use No  . Drug use: No  . Sexual activity: Not on file   Other Topics Concern  . Not on file   Social History Narrative  . No narrative on file    No Known Allergies   Current Outpatient Prescriptions:  .  albuterol (PROVENTIL) (2.5 MG/3ML) 0.083% nebulizer solution, Take 3  mLs (2.5 mg total) by nebulization every 6 (six) hours as needed for Wheezing., Disp: , Rfl:  .  apixaban (ELIQUIS) 5 MG TABS tablet, Take by mouth., Disp: , Rfl:  .  cholecalciferol (VITAMIN D) 1000 units tablet, 1 tablet (1,000 Units total) by Per G Tube route daily., Disp: , Rfl:  .  dofetilide (TIKOSYN) 500 MCG capsule, 1 capsule (500 mcg total) by Per G Tube route 2 (two) times daily., Disp: , Rfl:  .  famotidine (PEPCID) 20 MG tablet, 1 tablet (20 mg total) by Per G Tube route 2 (two) times daily., Disp: , Rfl:  .  ferrous sulfate 300 (60 Fe) MG/5ML syrup, 5 mLs (300 mg total) by Per G Tube route daily., Disp: , Rfl:  .  glipiZIDE (GLUCOTROL XL) 5 MG 24 hr tablet, TAKE 1 TABLET EVERY DAY PER G-TUBE, Disp: , Rfl:  .  albuterol (PROVENTIL HFA;VENTOLIN HFA) 108 (90 BASE) MCG/ACT inhaler, Inhale 1-2 puffs into the lungs every 6 (six) hours as needed for wheezing or shortness of breath., Disp: 1 Inhaler, Rfl: 0 .  apixaban (ELIQUIS) 5 MG TABS tablet, 5 mg by PEG Tube route 2 (two) times daily., Disp: , Rfl:  .  aspirin EC 81 MG tablet, 81 mg by PEG Tube route daily., Disp: , Rfl:  .  cholecalciferol (VITAMIN D) 1000 UNITS tablet, 1,000 Units by PEG Tube route daily. , Disp: , Rfl:  .  dofetilide (TIKOSYN) 250 MCG capsule, Take 250 mg Two Times Daily via Peg Tube (Patient taking differently: 250 mcg by PEG Tube route 2 (two) times daily. ), Disp: 60 capsule, Rfl: 11 .  ENSURE (  ENSURE), Take 237 mLs by mouth 3 (three) times daily., Disp: , Rfl:  .  famotidine (PEPCID) 20 MG tablet, 20 mg by PEG Tube route 2 (two) times daily., Disp: , Rfl:  .  ferrous sulfate 325 (65 FE) MG tablet, Take 325 mg by mouth 2 (two) times daily with a meal., Disp: , Rfl:  .  Fluticasone Furoate-Vilanterol (BREO ELLIPTA) 100-25 MCG/INH AEPB, Inhale 1 puff into the lungs daily., Disp: , Rfl:  .  glipiZIDE (GLUCOTROL) 5 MG tablet, 5 mg by PEG Tube route daily before breakfast., Disp: , Rfl:  .  HYDROcodone-acetaminophen  (NORCO/VICODIN) 5-325 MG per tablet, 1 tablet by PEG Tube route every 6 (six) hours as needed for moderate pain., Disp: , Rfl:  .  isosorbide dinitrate (ISORDIL) 30 MG tablet, 30 mg by PEG Tube route 3 (three) times daily., Disp: , Rfl:  .  levothyroxine (SYNTHROID, LEVOTHROID) 137 MCG tablet, Take 137 mcg by mouth daily before breakfast., Disp: , Rfl:  .  Melatonin 3 MG TABS, Take 3 mg by mouth at bedtime., Disp: , Rfl:  .  metoprolol (LOPRESSOR) 100 MG tablet, 100 mg by PEG Tube route 2 (two) times daily., Disp: , Rfl:  .  Nutritional Supplements (FEEDING SUPPLEMENT, JEVITY 1.5 CAL,) LIQD, Place 120 mLs into feeding tube 3 (three) times daily., Disp: , Rfl:  .  omeprazole (PRILOSEC) 20 MG capsule, Take 20 mg by mouth 2 (two) times daily before a meal., Disp: , Rfl:  .  OXYGEN, Inhale 2 L into the lungs daily., Disp: , Rfl:  .  PARoxetine (PAXIL) 40 MG tablet, Take 40 mg by mouth every evening., Disp: , Rfl:  .  polyethylene glycol powder (MIRALAX) powder, Take 1 Container by mouth once., Disp: , Rfl:  .  pravastatin (PRAVACHOL) 40 MG tablet, 40 mg by PEG Tube route at bedtime., Disp: , Rfl:  .  traZODone (DESYREL) 100 MG tablet, 100 mg by PEG Tube route at bedtime as needed for sleep., Disp: , Rfl:    Review of Systems  Unable to perform ROS: Dementia       Objective:   Physical Exam  Constitutional: He appears well-nourished. No distress.  HENT:  Head: Normocephalic and atraumatic.  Mouth/Throat: No oropharyngeal exudate.  Eyes: Conjunctivae and EOM are normal. No scleral icterus.  Neck: Normal range of motion. Neck supple.  Cardiovascular: Normal rate and regular rhythm.  Exam reveals no gallop and no friction rub.   Murmur heard. Pulmonary/Chest: Effort normal. No respiratory distress. He has no wheezes. He has no rales. He exhibits no tenderness.  Abdominal: Soft. Bowel sounds are normal. He exhibits no distension. There is no tenderness.  Musculoskeletal: He exhibits no edema or  tenderness.  Neurological: He is alert. He exhibits abnormal muscle tone.  Left sided hemiplegia  Skin: Skin is warm and dry. Rash noted. He is not diaphoretic. There is erythema. There is pallor.  Psychiatric: His speech is tangential. Thought content is delusional. Cognition and memory are impaired.   Feet without any DFU  Skin rash on back 12/02/15:       Mx sites of easy bruising on arms 12/02/15:           Assessment & Plan:    MRSA bacteremia in patient with mx medical problems. Source not clear but possibly due to cuts bruises on arms. We DID NOT exclude endocarditis with TTE.  However he has negative surveillance cultures which is encouraging.  I am some concern re potential  he could have infection of his aneurysm site but I am not anxious to expose him to large amount of contrast at this point and hazard contrast nephropathy  Instead would recommend he followup with [place] in several months  Goals of  Care: he would seem to have a very poor prognosis long term and would consider further discussions with family regarding level of agreession for care esp in light of his dementia  Rash on back: could be heat rash  Atypical chest pain: low suspicion for active ischemic disease that would warrant further workup in ED. Per daughter his caretakers in SNF aware oft his complaint.   I spent greater than 55 minutes with the patient including greater than 50% of time in face to face counsel of the patient and daughter re his MRSA bacteremia, rash dementia, aneurysm  and in coordination of his care.

## 2015-12-15 DIAGNOSIS — E119 Type 2 diabetes mellitus without complications: Secondary | ICD-10-CM | POA: Diagnosis not present

## 2015-12-15 DIAGNOSIS — D72829 Elevated white blood cell count, unspecified: Secondary | ICD-10-CM | POA: Diagnosis not present

## 2015-12-15 DIAGNOSIS — M2550 Pain in unspecified joint: Secondary | ICD-10-CM | POA: Diagnosis not present

## 2015-12-15 DIAGNOSIS — R4182 Altered mental status, unspecified: Secondary | ICD-10-CM | POA: Diagnosis not present

## 2015-12-15 DIAGNOSIS — A498 Other bacterial infections of unspecified site: Secondary | ICD-10-CM | POA: Diagnosis not present

## 2015-12-15 DIAGNOSIS — J449 Chronic obstructive pulmonary disease, unspecified: Secondary | ICD-10-CM | POA: Diagnosis not present

## 2015-12-16 DIAGNOSIS — R05 Cough: Secondary | ICD-10-CM | POA: Diagnosis not present

## 2015-12-18 DIAGNOSIS — D72829 Elevated white blood cell count, unspecified: Secondary | ICD-10-CM | POA: Diagnosis not present

## 2015-12-18 DIAGNOSIS — E119 Type 2 diabetes mellitus without complications: Secondary | ICD-10-CM | POA: Diagnosis not present

## 2015-12-18 DIAGNOSIS — J449 Chronic obstructive pulmonary disease, unspecified: Secondary | ICD-10-CM | POA: Diagnosis not present

## 2015-12-22 DIAGNOSIS — Z79899 Other long term (current) drug therapy: Secondary | ICD-10-CM | POA: Diagnosis not present

## 2015-12-22 DIAGNOSIS — D649 Anemia, unspecified: Secondary | ICD-10-CM | POA: Diagnosis not present

## 2015-12-22 DIAGNOSIS — N390 Urinary tract infection, site not specified: Secondary | ICD-10-CM | POA: Diagnosis not present

## 2015-12-22 DIAGNOSIS — R319 Hematuria, unspecified: Secondary | ICD-10-CM | POA: Diagnosis not present

## 2015-12-22 DIAGNOSIS — A498 Other bacterial infections of unspecified site: Secondary | ICD-10-CM | POA: Diagnosis not present

## 2015-12-22 DIAGNOSIS — D72829 Elevated white blood cell count, unspecified: Secondary | ICD-10-CM | POA: Diagnosis not present

## 2015-12-25 DIAGNOSIS — D72829 Elevated white blood cell count, unspecified: Secondary | ICD-10-CM | POA: Diagnosis not present

## 2015-12-25 DIAGNOSIS — N390 Urinary tract infection, site not specified: Secondary | ICD-10-CM | POA: Diagnosis not present

## 2015-12-25 DIAGNOSIS — D649 Anemia, unspecified: Secondary | ICD-10-CM | POA: Diagnosis not present

## 2015-12-29 DIAGNOSIS — C44622 Squamous cell carcinoma of skin of right upper limb, including shoulder: Secondary | ICD-10-CM | POA: Diagnosis not present

## 2015-12-29 DIAGNOSIS — Z961 Presence of intraocular lens: Secondary | ICD-10-CM | POA: Diagnosis not present

## 2015-12-29 DIAGNOSIS — Z7951 Long term (current) use of inhaled steroids: Secondary | ICD-10-CM | POA: Diagnosis not present

## 2015-12-29 DIAGNOSIS — Z7984 Long term (current) use of oral hypoglycemic drugs: Secondary | ICD-10-CM | POA: Diagnosis not present

## 2015-12-29 DIAGNOSIS — D485 Neoplasm of uncertain behavior of skin: Secondary | ICD-10-CM | POA: Diagnosis not present

## 2015-12-29 DIAGNOSIS — L821 Other seborrheic keratosis: Secondary | ICD-10-CM | POA: Diagnosis not present

## 2015-12-29 DIAGNOSIS — E119 Type 2 diabetes mellitus without complications: Secondary | ICD-10-CM | POA: Diagnosis not present

## 2015-12-31 DIAGNOSIS — D649 Anemia, unspecified: Secondary | ICD-10-CM | POA: Diagnosis not present

## 2015-12-31 DIAGNOSIS — D72829 Elevated white blood cell count, unspecified: Secondary | ICD-10-CM | POA: Diagnosis not present

## 2016-01-12 DIAGNOSIS — A498 Other bacterial infections of unspecified site: Secondary | ICD-10-CM | POA: Diagnosis not present

## 2016-01-12 DIAGNOSIS — M2550 Pain in unspecified joint: Secondary | ICD-10-CM | POA: Diagnosis not present

## 2016-02-04 DIAGNOSIS — I10 Essential (primary) hypertension: Secondary | ICD-10-CM | POA: Diagnosis not present

## 2016-02-04 DIAGNOSIS — D649 Anemia, unspecified: Secondary | ICD-10-CM | POA: Diagnosis not present

## 2016-02-04 DIAGNOSIS — D72829 Elevated white blood cell count, unspecified: Secondary | ICD-10-CM | POA: Diagnosis not present

## 2016-02-14 DIAGNOSIS — I10 Essential (primary) hypertension: Secondary | ICD-10-CM | POA: Diagnosis not present

## 2016-02-14 DIAGNOSIS — G4700 Insomnia, unspecified: Secondary | ICD-10-CM | POA: Diagnosis not present

## 2016-02-14 DIAGNOSIS — F0390 Unspecified dementia without behavioral disturbance: Secondary | ICD-10-CM | POA: Diagnosis not present

## 2016-02-14 DIAGNOSIS — R21 Rash and other nonspecific skin eruption: Secondary | ICD-10-CM | POA: Diagnosis not present

## 2016-02-18 DIAGNOSIS — D0461 Carcinoma in situ of skin of right upper limb, including shoulder: Secondary | ICD-10-CM | POA: Diagnosis not present

## 2016-02-18 DIAGNOSIS — C44622 Squamous cell carcinoma of skin of right upper limb, including shoulder: Secondary | ICD-10-CM | POA: Diagnosis not present

## 2016-02-18 DIAGNOSIS — L578 Other skin changes due to chronic exposure to nonionizing radiation: Secondary | ICD-10-CM | POA: Diagnosis not present

## 2016-02-18 DIAGNOSIS — L814 Other melanin hyperpigmentation: Secondary | ICD-10-CM | POA: Diagnosis not present

## 2016-02-18 DIAGNOSIS — L908 Other atrophic disorders of skin: Secondary | ICD-10-CM | POA: Diagnosis not present

## 2016-03-02 ENCOUNTER — Ambulatory Visit: Payer: Medicare Other | Admitting: Infectious Disease

## 2016-03-04 ENCOUNTER — Ambulatory Visit (INDEPENDENT_AMBULATORY_CARE_PROVIDER_SITE_OTHER): Payer: Medicare Other | Admitting: Infectious Disease

## 2016-03-04 ENCOUNTER — Encounter: Payer: Self-pay | Admitting: Infectious Disease

## 2016-03-04 VITALS — BP 120/64 | HR 62 | Temp 97.5°F

## 2016-03-04 DIAGNOSIS — R7881 Bacteremia: Secondary | ICD-10-CM | POA: Diagnosis not present

## 2016-03-04 DIAGNOSIS — I714 Abdominal aortic aneurysm, without rupture, unspecified: Secondary | ICD-10-CM

## 2016-03-04 DIAGNOSIS — R21 Rash and other nonspecific skin eruption: Secondary | ICD-10-CM

## 2016-03-04 DIAGNOSIS — M25559 Pain in unspecified hip: Secondary | ICD-10-CM

## 2016-03-04 DIAGNOSIS — M25551 Pain in right hip: Secondary | ICD-10-CM | POA: Diagnosis not present

## 2016-03-04 DIAGNOSIS — I10 Essential (primary) hypertension: Secondary | ICD-10-CM | POA: Diagnosis not present

## 2016-03-04 DIAGNOSIS — J159 Unspecified bacterial pneumonia: Secondary | ICD-10-CM

## 2016-03-04 DIAGNOSIS — D72829 Elevated white blood cell count, unspecified: Secondary | ICD-10-CM | POA: Diagnosis not present

## 2016-03-04 DIAGNOSIS — D649 Anemia, unspecified: Secondary | ICD-10-CM | POA: Diagnosis not present

## 2016-03-04 HISTORY — DX: Pain in unspecified hip: M25.559

## 2016-03-04 LAB — BASIC METABOLIC PANEL WITH GFR
BUN: 23 mg/dL (ref 7–25)
CALCIUM: 9.3 mg/dL (ref 8.6–10.3)
CO2: 24 mmol/L (ref 20–31)
Chloride: 103 mmol/L (ref 98–110)
Creat: 1.39 mg/dL — ABNORMAL HIGH (ref 0.70–1.18)
GFR, EST AFRICAN AMERICAN: 55 mL/min — AB (ref >=60)
GFR, EST NON AFRICAN AMERICAN: 48 mL/min — AB (ref >=60)
Glucose, Bld: 79 mg/dL (ref 65–99)
POTASSIUM: 4.2 mmol/L (ref 3.5–5.3)
SODIUM: 138 mmol/L (ref 135–146)

## 2016-03-04 NOTE — Progress Notes (Signed)
Subjective:   Chief complaint new right-sided hip pain   Patient ID: Alejandro Diaz, male    DOB: 1934-10-15, 81 y.o.   MRN: [id number]  HPI  81 year old Caucasian man with mx medical problems including CVA with hemiplegia, aortic aneurysm sp placement of stents in 2015 who developed fever at SNF. He was found to have MRSA bacteremia. I do not have record of whether or not he hadblood cultures done to document clearance prior to his having PICC placed. He had 2 d echocardiogram that showed valvular heart disease but no obvious vegetations. He completed 2 weeks of IV vancomycin and also zosyn for one week--not clear why on the latter since if he had PNA it was assuredly all due to his MRSA.   His repeat blood cultures checked for surveillance were negative.   I saw him in clinic several weeks ago and he was certainly confabulating quite a bit of time and clearly suffered from dementia.   He had a fairly significant rash at that time which is now resolved. He has been doing well per the daughter in terms of not showing evidence of recurrent infection. He does now complain of right-sided hip and back pain. His daughter believes this is due to him having changed from one type of bed to another. He does not have fever or nausea or other systemic symptoms review of systems is, my is by his demented status.  Past Medical History:  Diagnosis Date  . Arthritis   . Atrial fibrillation ([place])   . Atypical chest pain 12/02/2015  . CAD (coronary artery disease)    Stents to unknown arteries in 2006,2009  . Cancer ([place])    melanoma  . COPD (chronic obstructive pulmonary disease) ([place])   . CVA (cerebral infarction) 07/6810   Embolic Left sided hemiplegia.   . Diabetes mellitus without complication ([place])   . GERD (gastroesophageal reflux disease)   . Goals of care, counseling/discussion 12/02/2015  . Hemiplegia affecting left nondominant side ([place]) 12/02/2015  . Hypertension   . Ischemic cardiomyopathy      EF of 40-45%  . MRSA bacteremia 12/02/2015  . Rash and nonspecific skin eruption 12/02/2015    Past Surgical History:  Procedure Laterality Date  . BACK SURGERY    . BRAIN SURGERY    . CARDIAC SURGERY    . CORONARY STENT PLACEMENT    . PERCUTANEOUS ENDOSCOPIC GASTROSTOMY (PEG) REMOVAL N/A 03/19/2015   Procedure: PERCUTANEOUS ENDOSCOPIC GASTROSTOMY (PEG) REMOVAL;  Surgeon: Danie Binder, MD;  [facility];  Service: Endoscopy;  Laterality: N/A;  1200  . SPLENECTOMY      Family History  Problem Relation Age of Onset  . Heart attack Father       Social History   Social History  . Marital status: Married    Spouse name: N/A  . Number of children: N/A  . Years of education: N/A   Social History Main Topics  . Smoking status: Former Smoker    Quit date: 03/22/2010  . Smokeless tobacco: Never Used  . Alcohol use No  . Drug use: No  . Sexual activity: Not on file   Other Topics Concern  . Not on file   Social History Narrative  . No narrative on file    No Known Allergies   Current Outpatient Prescriptions:  .  albuterol (PROVENTIL HFA;VENTOLIN HFA) 108 (90 BASE) MCG/ACT inhaler, Inhale 1-2 puffs into the lungs every 6 (six) hours as needed  for wheezing or shortness of breath., Disp: 1 Inhaler, Rfl: 0 .  albuterol (PROVENTIL) (2.5 MG/3ML) 0.083% nebulizer solution, Take 3 mLs (2.5 mg total) by nebulization every 6 (six) hours as needed for Wheezing., Disp: , Rfl:  .  apixaban (ELIQUIS) 5 MG TABS tablet, Take by mouth., Disp: , Rfl:  .  cholecalciferol (VITAMIN D) 1000 UNITS tablet, 1,000 Units by PEG Tube route daily. , Disp: , Rfl:  .  dofetilide (TIKOSYN) 500 MCG capsule, 1 capsule (500 mcg total) by Per G Tube route 2 (two) times daily., Disp: , Rfl:  .  dofetilide (TIKOSYN) 500 MCG capsule, 1 capsule (500 mcg total) by Per G Tube route 2 (two) times daily., Disp: , Rfl:  .  ENSURE (ENSURE), Take 237 mLs by mouth 3 (three) times daily., Disp: , Rfl:  .   famotidine (PEPCID) 20 MG tablet, 1 tablet (20 mg total) by Per G Tube route 2 (two) times daily., Disp: , Rfl:  .  ferrous sulfate 325 (65 FE) MG tablet, Take 325 mg by mouth 2 (two) times daily with a meal., Disp: , Rfl:  .  Fluticasone Furoate-Vilanterol (BREO ELLIPTA) 100-25 MCG/INH AEPB, Inhale 1 puff into the lungs daily., Disp: , Rfl:  .  glipiZIDE (GLUCOTROL XL) 5 MG 24 hr tablet, TAKE 1 TABLET EVERY DAY PER G-TUBE, Disp: , Rfl:  .  levothyroxine (SYNTHROID, LEVOTHROID) 137 MCG tablet, Take 137 mcg by mouth daily before breakfast., Disp: , Rfl:  .  Melatonin 3 MG TABS, Take 3 mg by mouth at bedtime., Disp: , Rfl:  .  Nutritional Supplements (FEEDING SUPPLEMENT, JEVITY 1.5 CAL,) LIQD, Place 120 mLs into feeding tube 3 (three) times daily., Disp: , Rfl:  .  omeprazole (PRILOSEC) 20 MG capsule, Take 20 mg by mouth 2 (two) times daily before a meal., Disp: , Rfl:  .  OXYGEN, Inhale 2 L into the lungs daily., Disp: , Rfl:  .  PARoxetine (PAXIL) 40 MG tablet, Take 40 mg by mouth every evening., Disp: , Rfl:  .  polyethylene glycol powder (MIRALAX) powder, Take 1 Container by mouth once., Disp: , Rfl:  .  apixaban (ELIQUIS) 5 MG TABS tablet, 5 mg by PEG Tube route 2 (two) times daily., Disp: , Rfl:  .  aspirin EC 81 MG tablet, 81 mg by PEG Tube route daily., Disp: , Rfl:  .  cholecalciferol (VITAMIN D) 1000 units tablet, 1 tablet (1,000 Units total) by Per G Tube route daily., Disp: , Rfl:  .  dofetilide (TIKOSYN) 250 MCG capsule, Take 250 mg Two Times Daily via Peg Tube (Patient not taking: Reported on 03/04/2016), Disp: 60 capsule, Rfl: 11 .  famotidine (PEPCID) 20 MG tablet, 20 mg by PEG Tube route 2 (two) times daily., Disp: , Rfl:  .  ferrous sulfate 300 (60 Fe) MG/5ML syrup, 5 mLs (300 mg total) by Per G Tube route daily., Disp: , Rfl:  .  glipiZIDE (GLUCOTROL) 5 MG tablet, 5 mg by PEG Tube route daily before breakfast., Disp: , Rfl:  .  HYDROcodone-acetaminophen (NORCO/VICODIN) 5-325 MG per  tablet, 1 tablet by PEG Tube route every 6 (six) hours as needed for moderate pain., Disp: , Rfl:  .  isosorbide dinitrate (ISORDIL) 30 MG tablet, 30 mg by PEG Tube route 3 (three) times daily., Disp: , Rfl:  .  metoprolol (LOPRESSOR) 100 MG tablet, 100 mg by PEG Tube route 2 (two) times daily., Disp: , Rfl:  .  pravastatin (PRAVACHOL) 40 MG tablet,  40 mg by PEG Tube route at bedtime., Disp: , Rfl:  .  traZODone (DESYREL) 100 MG tablet, 100 mg by PEG Tube route at bedtime as needed for sleep., Disp: , Rfl:    Past Medical History:  Diagnosis Date  . Arthritis   . Atrial fibrillation ([place])   . Atypical chest pain 12/02/2015  . CAD (coronary artery disease)    Stents to unknown arteries in 2006,2009  . Cancer ([place])    melanoma  . COPD (chronic obstructive pulmonary disease) ([place])   . CVA (cerebral infarction) 09/3417   Embolic Left sided hemiplegia.   . Diabetes mellitus without complication ([place])   . GERD (gastroesophageal reflux disease)   . Goals of care, counseling/discussion 12/02/2015  . Hemiplegia affecting left nondominant side ([place]) 12/02/2015  . Hypertension   . Ischemic cardiomyopathy    EF of 40-45%  . MRSA bacteremia 12/02/2015  . Rash and nonspecific skin eruption 12/02/2015    Past Surgical History:  Procedure Laterality Date  . BACK SURGERY    . BRAIN SURGERY    . CARDIAC SURGERY    . CORONARY STENT PLACEMENT    . PERCUTANEOUS ENDOSCOPIC GASTROSTOMY (PEG) REMOVAL N/A 03/19/2015   Procedure: PERCUTANEOUS ENDOSCOPIC GASTROSTOMY (PEG) REMOVAL;  Surgeon: Danie Binder, MD;  [facility];  Service: Endoscopy;  Laterality: N/A;  1200  . SPLENECTOMY      Family History  Problem Relation Age of Onset  . Heart attack Father       Social History   Social History  . Marital status: Married    Spouse name: N/A  . Number of children: N/A  . Years of education: N/A   Social History Main Topics  . Smoking status: Former Smoker    Quit date: 03/22/2010  .  Smokeless tobacco: Never Used  . Alcohol use No  . Drug use: No  . Sexual activity: Not on file   Other Topics Concern  . Not on file   Social History Narrative  . No narrative on file    No Known Allergies   Current Outpatient Prescriptions:  .  albuterol (PROVENTIL HFA;VENTOLIN HFA) 108 (90 BASE) MCG/ACT inhaler, Inhale 1-2 puffs into the lungs every 6 (six) hours as needed for wheezing or shortness of breath., Disp: 1 Inhaler, Rfl: 0 .  albuterol (PROVENTIL) (2.5 MG/3ML) 0.083% nebulizer solution, Take 3 mLs (2.5 mg total) by nebulization every 6 (six) hours as needed for Wheezing., Disp: , Rfl:  .  apixaban (ELIQUIS) 5 MG TABS tablet, Take by mouth., Disp: , Rfl:  .  cholecalciferol (VITAMIN D) 1000 UNITS tablet, 1,000 Units by PEG Tube route daily. , Disp: , Rfl:  .  dofetilide (TIKOSYN) 500 MCG capsule, 1 capsule (500 mcg total) by Per G Tube route 2 (two) times daily., Disp: , Rfl:  .  dofetilide (TIKOSYN) 500 MCG capsule, 1 capsule (500 mcg total) by Per G Tube route 2 (two) times daily., Disp: , Rfl:  .  ENSURE (ENSURE), Take 237 mLs by mouth 3 (three) times daily., Disp: , Rfl:  .  famotidine (PEPCID) 20 MG tablet, 1 tablet (20 mg total) by Per G Tube route 2 (two) times daily., Disp: , Rfl:  .  ferrous sulfate 325 (65 FE) MG tablet, Take 325 mg by mouth 2 (two) times daily with a meal., Disp: , Rfl:  .  Fluticasone Furoate-Vilanterol (BREO ELLIPTA) 100-25 MCG/INH AEPB, Inhale 1 puff into the lungs daily., Disp: , Rfl:  .  glipiZIDE (GLUCOTROL XL) 5 MG 24 hr tablet, TAKE 1 TABLET EVERY DAY PER G-TUBE, Disp: , Rfl:  .  levothyroxine (SYNTHROID, LEVOTHROID) 137 MCG tablet, Take 137 mcg by mouth daily before breakfast., Disp: , Rfl:  .  Melatonin 3 MG TABS, Take 3 mg by mouth at bedtime., Disp: , Rfl:  .  Nutritional Supplements (FEEDING SUPPLEMENT, JEVITY 1.5 CAL,) LIQD, Place 120 mLs into feeding tube 3 (three) times daily., Disp: , Rfl:  .  omeprazole (PRILOSEC) 20 MG capsule,  Take 20 mg by mouth 2 (two) times daily before a meal., Disp: , Rfl:  .  OXYGEN, Inhale 2 L into the lungs daily., Disp: , Rfl:  .  PARoxetine (PAXIL) 40 MG tablet, Take 40 mg by mouth every evening., Disp: , Rfl:  .  polyethylene glycol powder (MIRALAX) powder, Take 1 Container by mouth once., Disp: , Rfl:  .  apixaban (ELIQUIS) 5 MG TABS tablet, 5 mg by PEG Tube route 2 (two) times daily., Disp: , Rfl:  .  aspirin EC 81 MG tablet, 81 mg by PEG Tube route daily., Disp: , Rfl:  .  cholecalciferol (VITAMIN D) 1000 units tablet, 1 tablet (1,000 Units total) by Per G Tube route daily., Disp: , Rfl:  .  dofetilide (TIKOSYN) 250 MCG capsule, Take 250 mg Two Times Daily via Peg Tube (Patient not taking: Reported on 03/04/2016), Disp: 60 capsule, Rfl: 11 .  famotidine (PEPCID) 20 MG tablet, 20 mg by PEG Tube route 2 (two) times daily., Disp: , Rfl:  .  ferrous sulfate 300 (60 Fe) MG/5ML syrup, 5 mLs (300 mg total) by Per G Tube route daily., Disp: , Rfl:  .  glipiZIDE (GLUCOTROL) 5 MG tablet, 5 mg by PEG Tube route daily before breakfast., Disp: , Rfl:  .  HYDROcodone-acetaminophen (NORCO/VICODIN) 5-325 MG per tablet, 1 tablet by PEG Tube route every 6 (six) hours as needed for moderate pain., Disp: , Rfl:  .  isosorbide dinitrate (ISORDIL) 30 MG tablet, 30 mg by PEG Tube route 3 (three) times daily., Disp: , Rfl:  .  metoprolol (LOPRESSOR) 100 MG tablet, 100 mg by PEG Tube route 2 (two) times daily., Disp: , Rfl:  .  pravastatin (PRAVACHOL) 40 MG tablet, 40 mg by PEG Tube route at bedtime., Disp: , Rfl:  .  traZODone (DESYREL) 100 MG tablet, 100 mg by PEG Tube route at bedtime as needed for sleep., Disp: , Rfl:    Review of Systems  Unable to perform ROS: Dementia       Objective:   Physical Exam  Constitutional: He appears well-nourished. No distress.  HENT:  Head: Normocephalic and atraumatic.  Mouth/Throat: No oropharyngeal exudate.  Eyes: Conjunctivae and EOM are normal. No scleral  icterus.  Neck: Normal range of motion. Neck supple.  Cardiovascular: Normal rate and regular rhythm.  Exam reveals no gallop and no friction rub.   Murmur heard. Pulmonary/Chest: Effort normal. No respiratory distress. He has no wheezes. He has no rales. He exhibits no tenderness.  Abdominal: Soft. Bowel sounds are normal. He exhibits no distension. There is no tenderness.  Musculoskeletal: He exhibits no edema or tenderness.       Right hip: He exhibits normal range of motion, normal strength, no tenderness and no bony tenderness.       Back:  Neurological: He is alert. He exhibits abnormal muscle tone.  Left sided hemiplegia  Skin: Skin is warm and dry. He is not diaphoretic.  Psychiatric: His speech is  tangential. Thought content is delusional. Cognition and memory are impaired.   Feet without any DFU  Skin rash on back 12/02/15:       Mx sites of easy bruising on arms 12/02/15:      Skin today 03/04/16:           Assessment & Plan:    MRSA bacteremia in patient with mx medical problems. Source not clear but possibly due to cuts bruises on arms. We DID NOT exclude endocarditis with TTE.  However he has negative surveillance cultures which is encouraging.  I had some some concern re potential he could have infection of his aneurysm site but I am not anxious to expose him to large amount of contrast at this point and hazard contrast nephropathy  Instead elected to  followup with [place] in several months which is today   We will check ESR, CRP and BMP. If normal rtc PRN   Rash on back: could be heat rash,is now gone  Back pain: I will not work this up unless inflammatory markers are up or this worsens signfiicantly   I spent greater than 25  minutes with the patient including greater than 50% of time in face to face counsel of the patient and daughter re his MRSA bacteremia, rash dementia, back pain aneurysm  and in coordination of his care.

## 2016-03-05 DIAGNOSIS — I10 Essential (primary) hypertension: Secondary | ICD-10-CM | POA: Diagnosis not present

## 2016-03-05 DIAGNOSIS — D72829 Elevated white blood cell count, unspecified: Secondary | ICD-10-CM | POA: Diagnosis not present

## 2016-03-05 DIAGNOSIS — I2510 Atherosclerotic heart disease of native coronary artery without angina pectoris: Secondary | ICD-10-CM | POA: Diagnosis not present

## 2016-03-05 DIAGNOSIS — E785 Hyperlipidemia, unspecified: Secondary | ICD-10-CM | POA: Diagnosis not present

## 2016-03-05 DIAGNOSIS — A498 Other bacterial infections of unspecified site: Secondary | ICD-10-CM | POA: Diagnosis not present

## 2016-03-05 DIAGNOSIS — D649 Anemia, unspecified: Secondary | ICD-10-CM | POA: Diagnosis not present

## 2016-03-05 LAB — SEDIMENTATION RATE: Sed Rate: 16 mm/hr (ref 0–20)

## 2016-03-05 LAB — C-REACTIVE PROTEIN: CRP: 42.8 mg/L — AB (ref <8.0)

## 2016-03-31 DIAGNOSIS — D649 Anemia, unspecified: Secondary | ICD-10-CM | POA: Diagnosis not present

## 2016-03-31 DIAGNOSIS — D72829 Elevated white blood cell count, unspecified: Secondary | ICD-10-CM | POA: Diagnosis not present

## 2016-04-28 DIAGNOSIS — D72829 Elevated white blood cell count, unspecified: Secondary | ICD-10-CM | POA: Diagnosis not present

## 2016-04-28 DIAGNOSIS — I10 Essential (primary) hypertension: Secondary | ICD-10-CM | POA: Diagnosis not present

## 2016-04-28 DIAGNOSIS — D649 Anemia, unspecified: Secondary | ICD-10-CM | POA: Diagnosis not present

## 2016-04-29 DIAGNOSIS — N183 Chronic kidney disease, stage 3 (moderate): Secondary | ICD-10-CM | POA: Diagnosis not present

## 2016-04-29 DIAGNOSIS — D72829 Elevated white blood cell count, unspecified: Secondary | ICD-10-CM | POA: Diagnosis not present

## 2016-04-29 DIAGNOSIS — D649 Anemia, unspecified: Secondary | ICD-10-CM | POA: Diagnosis not present

## 2016-04-29 DIAGNOSIS — I10 Essential (primary) hypertension: Secondary | ICD-10-CM | POA: Diagnosis not present

## 2016-05-02 DIAGNOSIS — M79622 Pain in left upper arm: Secondary | ICD-10-CM | POA: Diagnosis not present

## 2016-05-02 DIAGNOSIS — M19012 Primary osteoarthritis, left shoulder: Secondary | ICD-10-CM | POA: Diagnosis not present

## 2016-05-02 DIAGNOSIS — M19022 Primary osteoarthritis, left elbow: Secondary | ICD-10-CM | POA: Diagnosis not present

## 2016-05-02 DIAGNOSIS — M79632 Pain in left forearm: Secondary | ICD-10-CM | POA: Diagnosis not present

## 2016-05-02 DIAGNOSIS — M19032 Primary osteoarthritis, left wrist: Secondary | ICD-10-CM | POA: Diagnosis not present

## 2016-05-03 DIAGNOSIS — M1990 Unspecified osteoarthritis, unspecified site: Secondary | ICD-10-CM | POA: Diagnosis not present

## 2016-05-03 DIAGNOSIS — W06XXXD Fall from bed, subsequent encounter: Secondary | ICD-10-CM | POA: Diagnosis not present

## 2016-05-03 DIAGNOSIS — M79602 Pain in left arm: Secondary | ICD-10-CM | POA: Diagnosis not present

## 2016-05-20 DIAGNOSIS — I10 Essential (primary) hypertension: Secondary | ICD-10-CM | POA: Diagnosis not present

## 2016-05-20 DIAGNOSIS — L853 Xerosis cutis: Secondary | ICD-10-CM | POA: Diagnosis not present

## 2016-05-20 DIAGNOSIS — M79602 Pain in left arm: Secondary | ICD-10-CM | POA: Diagnosis not present

## 2016-05-26 DIAGNOSIS — I10 Essential (primary) hypertension: Secondary | ICD-10-CM | POA: Diagnosis not present

## 2016-05-26 DIAGNOSIS — D72829 Elevated white blood cell count, unspecified: Secondary | ICD-10-CM | POA: Diagnosis not present

## 2016-05-26 DIAGNOSIS — D649 Anemia, unspecified: Secondary | ICD-10-CM | POA: Diagnosis not present

## 2016-06-03 DIAGNOSIS — R21 Rash and other nonspecific skin eruption: Secondary | ICD-10-CM | POA: Diagnosis not present

## 2016-06-03 DIAGNOSIS — I10 Essential (primary) hypertension: Secondary | ICD-10-CM | POA: Diagnosis not present

## 2016-06-03 DIAGNOSIS — L853 Xerosis cutis: Secondary | ICD-10-CM | POA: Diagnosis not present

## 2016-06-21 DIAGNOSIS — K219 Gastro-esophageal reflux disease without esophagitis: Secondary | ICD-10-CM | POA: Diagnosis not present

## 2016-06-21 DIAGNOSIS — N183 Chronic kidney disease, stage 3 (moderate): Secondary | ICD-10-CM | POA: Diagnosis not present

## 2016-06-21 DIAGNOSIS — L853 Xerosis cutis: Secondary | ICD-10-CM | POA: Diagnosis not present

## 2016-06-21 DIAGNOSIS — R21 Rash and other nonspecific skin eruption: Secondary | ICD-10-CM | POA: Diagnosis not present

## 2016-06-29 DIAGNOSIS — R21 Rash and other nonspecific skin eruption: Secondary | ICD-10-CM | POA: Diagnosis not present

## 2016-06-29 DIAGNOSIS — M1990 Unspecified osteoarthritis, unspecified site: Secondary | ICD-10-CM | POA: Diagnosis not present

## 2016-06-29 DIAGNOSIS — I69354 Hemiplegia and hemiparesis following cerebral infarction affecting left non-dominant side: Secondary | ICD-10-CM | POA: Diagnosis not present

## 2016-06-29 DIAGNOSIS — L853 Xerosis cutis: Secondary | ICD-10-CM | POA: Diagnosis not present

## 2016-07-12 DIAGNOSIS — K219 Gastro-esophageal reflux disease without esophagitis: Secondary | ICD-10-CM | POA: Diagnosis not present

## 2016-07-12 DIAGNOSIS — M1990 Unspecified osteoarthritis, unspecified site: Secondary | ICD-10-CM | POA: Diagnosis not present

## 2016-07-12 DIAGNOSIS — I69354 Hemiplegia and hemiparesis following cerebral infarction affecting left non-dominant side: Secondary | ICD-10-CM | POA: Diagnosis not present

## 2016-07-12 DIAGNOSIS — I4891 Unspecified atrial fibrillation: Secondary | ICD-10-CM | POA: Diagnosis not present

## 2016-07-21 DIAGNOSIS — D72829 Elevated white blood cell count, unspecified: Secondary | ICD-10-CM | POA: Diagnosis not present

## 2016-07-21 DIAGNOSIS — R4182 Altered mental status, unspecified: Secondary | ICD-10-CM | POA: Diagnosis not present

## 2016-07-21 DIAGNOSIS — M2550 Pain in unspecified joint: Secondary | ICD-10-CM | POA: Diagnosis not present

## 2016-07-21 DIAGNOSIS — D649 Anemia, unspecified: Secondary | ICD-10-CM | POA: Diagnosis not present

## 2016-08-18 DIAGNOSIS — R4182 Altered mental status, unspecified: Secondary | ICD-10-CM | POA: Diagnosis not present

## 2016-08-18 DIAGNOSIS — M2550 Pain in unspecified joint: Secondary | ICD-10-CM | POA: Diagnosis not present

## 2016-08-18 DIAGNOSIS — D649 Anemia, unspecified: Secondary | ICD-10-CM | POA: Diagnosis not present

## 2016-09-01 DIAGNOSIS — N183 Chronic kidney disease, stage 3 (moderate): Secondary | ICD-10-CM | POA: Diagnosis not present

## 2016-09-01 DIAGNOSIS — L853 Xerosis cutis: Secondary | ICD-10-CM | POA: Diagnosis not present

## 2016-09-01 DIAGNOSIS — L219 Seborrheic dermatitis, unspecified: Secondary | ICD-10-CM | POA: Diagnosis not present

## 2016-09-01 DIAGNOSIS — M79602 Pain in left arm: Secondary | ICD-10-CM | POA: Diagnosis not present

## 2016-09-15 DIAGNOSIS — D649 Anemia, unspecified: Secondary | ICD-10-CM | POA: Diagnosis not present

## 2016-09-15 DIAGNOSIS — M2550 Pain in unspecified joint: Secondary | ICD-10-CM | POA: Diagnosis not present

## 2016-09-15 DIAGNOSIS — I10 Essential (primary) hypertension: Secondary | ICD-10-CM | POA: Diagnosis not present

## 2016-09-15 DIAGNOSIS — D72829 Elevated white blood cell count, unspecified: Secondary | ICD-10-CM | POA: Diagnosis not present

## 2016-09-15 DIAGNOSIS — A498 Other bacterial infections of unspecified site: Secondary | ICD-10-CM | POA: Diagnosis not present

## 2016-10-04 DIAGNOSIS — R0789 Other chest pain: Secondary | ICD-10-CM | POA: Diagnosis not present

## 2016-10-04 DIAGNOSIS — I69354 Hemiplegia and hemiparesis following cerebral infarction affecting left non-dominant side: Secondary | ICD-10-CM | POA: Diagnosis not present

## 2016-10-04 DIAGNOSIS — I4891 Unspecified atrial fibrillation: Secondary | ICD-10-CM | POA: Diagnosis not present

## 2016-10-04 DIAGNOSIS — K219 Gastro-esophageal reflux disease without esophagitis: Secondary | ICD-10-CM | POA: Diagnosis not present

## 2016-10-13 DIAGNOSIS — D649 Anemia, unspecified: Secondary | ICD-10-CM | POA: Diagnosis not present

## 2016-10-13 DIAGNOSIS — M2550 Pain in unspecified joint: Secondary | ICD-10-CM | POA: Diagnosis not present

## 2016-10-13 DIAGNOSIS — R4182 Altered mental status, unspecified: Secondary | ICD-10-CM | POA: Diagnosis not present

## 2016-10-18 ENCOUNTER — Ambulatory Visit (INDEPENDENT_AMBULATORY_CARE_PROVIDER_SITE_OTHER): Payer: Medicare Other | Admitting: Adult Health

## 2016-10-18 ENCOUNTER — Encounter: Payer: Self-pay | Admitting: Adult Health

## 2016-10-18 VITALS — BP 92/54 | HR 62 | Ht 72.0 in | Wt 194.0 lb

## 2016-10-18 DIAGNOSIS — I69354 Hemiplegia and hemiparesis following cerebral infarction affecting left non-dominant side: Secondary | ICD-10-CM | POA: Diagnosis not present

## 2016-10-18 DIAGNOSIS — D508 Other iron deficiency anemias: Secondary | ICD-10-CM

## 2016-10-18 DIAGNOSIS — N183 Chronic kidney disease, stage 3 (moderate): Secondary | ICD-10-CM | POA: Diagnosis not present

## 2016-10-18 DIAGNOSIS — I4891 Unspecified atrial fibrillation: Secondary | ICD-10-CM | POA: Diagnosis not present

## 2016-10-18 DIAGNOSIS — E7800 Pure hypercholesterolemia, unspecified: Secondary | ICD-10-CM | POA: Diagnosis not present

## 2016-10-18 DIAGNOSIS — I10 Essential (primary) hypertension: Secondary | ICD-10-CM | POA: Diagnosis not present

## 2016-10-18 DIAGNOSIS — D649 Anemia, unspecified: Secondary | ICD-10-CM | POA: Diagnosis not present

## 2016-10-18 MED ORDER — ISOSORBIDE MONONITRATE ER 30 MG PO TB24: 15.0000 mg | ORAL_TABLET | Freq: Every day | ORAL | 3 refills | Status: DC

## 2016-10-18 NOTE — Progress Notes (Signed)
Cardiology Office Note   Date:  10/18/2016   ID:  Alejandro Diaz, DOB November 10, 1934, MRN [id number]  PCP:  Alejandro Gravel, MD  Cardiologist:  Alejandro Diaz  Chief Complaint  Patient presents with  . Atrial Fibrillation      History of Present Illness: Alejandro Diaz is a 82 y.o. male who presents for ongoing assessment and management of atrial fibrillation, on anticoagulation therapy. The patient has a history of CVA with left-sided weakness and mild expressive aphasia. He was last seen by Dr. Lovena Diaz on 08/29/2015. At that time the patient was controlled with normal sinus rhythm on tikosyn, blood pressure was controlled, and tolerating ELIQUIS.  He is here today confused. He is with N/A. He is currently at a skilled nursing facility [facility]. Review of his medication list has multiple duplicates. He no longer has a G-tube. At the review of several findings of paperwork, is noted that the patient is now on Tikosyn  250 mg twice a day. Medications are provided by his skilled nursing facility staff.  He is very an active, sitting in a wheelchair the majority of the day. According to the nurses state he is eating and drinking without any difficulty. He denies any recurrent chest pain. Breathing status is stable with frequent coughing. He wears oxygen at night.  Past Medical History:  Diagnosis Date  . Arthritis   . Atrial fibrillation ([place])   . Atypical chest pain 12/02/2015  . CAD (coronary artery disease)    Stents to unknown arteries in 2006,2009  . Cancer ([place])    melanoma  . COPD (chronic obstructive pulmonary disease) ([place])   . CVA (cerebral infarction) 0/3500   Embolic Left sided hemiplegia.   . Diabetes mellitus without complication ([place])   . GERD (gastroesophageal reflux disease)   . Goals of care, counseling/discussion 12/02/2015  . Hemiplegia affecting left nondominant side ([place]) 12/02/2015  . Hip pain 03/04/2016  . Hypertension   . Ischemic cardiomyopathy    EF of 40-45%  . MRSA  bacteremia 12/02/2015  . Rash and nonspecific skin eruption 12/02/2015    Past Surgical History:  Procedure Laterality Date  . BACK SURGERY    . BRAIN SURGERY    . CARDIAC SURGERY    . CORONARY STENT PLACEMENT    . PERCUTANEOUS ENDOSCOPIC GASTROSTOMY (PEG) REMOVAL N/A 03/19/2015   Procedure: PERCUTANEOUS ENDOSCOPIC GASTROSTOMY (PEG) REMOVAL;  Surgeon: Danie Binder, MD;  Location[facility];  Service: Endoscopy;  Laterality: N/A;  1200  . SPLENECTOMY       Current Outpatient Prescriptions  Medication Sig Dispense Refill  . albuterol (PROVENTIL HFA;VENTOLIN HFA) 108 (90 BASE) MCG/ACT inhaler Inhale 1-2 puffs into the lungs every 6 (six) hours as needed for wheezing or shortness of breath. 1 Inhaler 0  . albuterol (PROVENTIL) (2.5 MG/3ML) 0.083% nebulizer solution Take 3 mLs (2.5 mg total) by nebulization every 6 (six) hours as needed for Wheezing.    Marland Kitchen apixaban (ELIQUIS) 5 MG TABS tablet Take 5 mg by mouth 2 (two) times daily.     Marland Kitchen aspirin EC 81 MG tablet Take 81 mg by mouth daily.     . cholecalciferol (VITAMIN D) 1000 UNITS tablet Take 1,000 Units by mouth daily.     Marland Kitchen dofetilide (TIKOSYN) 250 MCG capsule Take 250 mcg by mouth 2 (two) times daily.    Marland Kitchen ENSURE (ENSURE) Take 237 mLs by mouth 3 (three) times daily.    . famotidine (PEPCID) 20 MG tablet Take 20  mg by mouth 2 (two) times daily.     . ferrous sulfate 325 (65 FE) MG tablet Take 325 mg by mouth 2 (two) times daily with a meal.    . Fluticasone Furoate-Vilanterol (BREO ELLIPTA) 100-25 MCG/INH AEPB Inhale 1 puff into the lungs daily.    Marland Kitchen glipiZIDE (GLUCOTROL XL) 5 MG 24 hr tablet TAKE 1 TABLET EVERY DAY oral    . glipiZIDE (GLUCOTROL) 5 MG tablet 5 mg by PEG Tube route daily before breakfast.    . HYDROcodone-acetaminophen (NORCO/VICODIN) 5-325 MG per tablet Take 1 tablet by mouth every 6 (six) hours as needed for moderate pain.     . isosorbide dinitrate (ISORDIL) 30 MG tablet 30 mg by PEG Tube route 3 (three) times  daily.    Marland Kitchen levothyroxine (SYNTHROID, LEVOTHROID) 137 MCG tablet Take 137 mcg by mouth daily before breakfast.    . Melatonin 3 MG TABS Take 3 mg by mouth at bedtime.    . metoprolol (LOPRESSOR) 100 MG tablet Take 100 mg by mouth 2 (two) times daily.     . Nutritional Supplements (FEEDING SUPPLEMENT, JEVITY 1.5 CAL,) LIQD Place 120 mLs into feeding tube 3 (three) times daily.    Marland Kitchen omeprazole (PRILOSEC) 20 MG capsule Take 20 mg by mouth 2 (two) times daily before a meal.    . OXYGEN Inhale 2 L into the lungs daily.    Marland Kitchen PARoxetine (PAXIL) 40 MG tablet Take 40 mg by mouth every evening.    . polyethylene glycol powder (MIRALAX) powder Take 1 Container by mouth once.    . pravastatin (PRAVACHOL) 40 MG tablet Take 40 mg by mouth at bedtime.     . traZODone (DESYREL) 100 MG tablet Take 100 mg by mouth at bedtime as needed for sleep.     . isosorbide mononitrate (IMDUR) 30 MG 24 hr tablet Take 0.5 tablets (15 mg total) by mouth daily. 45 tablet 3   No current facility-administered medications for this visit.     Allergies:   Patient has no known allergies.    Social History:  The patient  reports that he quit smoking about 6 years ago. He has never used smokeless tobacco. He reports that he does not drink alcohol or use drugs.   Family History:  The patient's family history includes Heart attack in his father.    ROS: All other systems are reviewed and negative. Unless otherwise mentioned in H&P    PHYSICAL EXAM: VS:  BP (!) 92/54   Pulse 62   Ht 6' (1.829 m)   Wt 194 lb (88 kg)   SpO2 96%   BMI 26.31 kg/m  , BMI Body mass index is 26.31 kg/m. GEN: Well nourished, well developed, in no acute distress  HEENT: normal  Neck: no JVD, carotid bruits, or masses Cardiac: IRRR; no murmurs, rubs, or gallops,no edema  Respiratory:  clear to auscultation bilaterally, normal work of breathing GI: soft, nontender, nondistended, + BS MS: no deformity or atrophy Righ very pale. t-sided weakness  with left-sided atrophy.  Skin: warm and dry, no rash.  Neuro:  Strength and sensation are intact Psych: euthymic mood, full affect   EKG:  atrial bradycardia heart rate of 55 bpm with frequent PVCs. Unable to evaluate QT interval.   Recent Labs: 03/04/2016: BUN 23; Creat 1.39; Potassium 4.2; Sodium 138    Lipid Panel No results found for: CHOL, TRIG, HDL, CHOLHDL, VLDL, LDLCALC, LDLDIRECT    Wt Readings from Last 3 Encounters:  10/18/16 194 lb (88 kg)  08/29/15 198 lb (89.8 kg)  03/19/15 248 lb (112.5 kg)      Other studies Reviewed:  Echocardiogram 11/11/2016 Left ventricle: The cavity size was normal. Wall thickness was   increased in a pattern of mild LVH. The estimated ejection   fraction was 50%. There is hypokinesis of the basalinferolateral   myocardium. Features are consistent with a pseudonormal left   ventricular filling pattern, with concomitant abnormal relaxation   and increased filling pressure (grade 2 diastolic dysfunction). - Aortic valve: Functionally bicuspid; severely calcified leaflets.   There was moderate stenosis. There was mild to moderate   regurgitation. Mean gradient (S): 14 mm Hg. Peak gradient (S): 25   mm Hg. VTI ratio of LVOT to aortic valve: 0.31. Valve area (VTI):   1.28 cm^2. Valve area (Vmax): 1.61 cm^2. Valve area (Vmean): 1.47   cm^2. - Mitral valve: Calcified annulus. Mildly thickened leaflets . - Left atrium: The atrium was mildly dilated. - Right atrium: The atrium was at the upper limits of normal in   size. Central venous pressure (est): 3 mm Hg. - Atrial septum: Possible ASD (primum ?) with intermittent   left-to-right flow noted near annular portion of the interatrial   septum. - Tricuspid valve: There was trivial regurgitation. - Pulmonary arteries: PA peak pressure: 34 mm Hg (S). - Pericardium, extracardiac: There was no pericardial effusion.  ASSESSMENT AND PLAN:  1.  Chronic atrial fibrillation: Rate is bradycardic.  EKG reveals heart rate of 55 bpm with frequent ventricular ectopy. Unable to evaluate QT interval. We'll have taken some level and BMET drawn. He will continue on ELIQUIS 5 mg twice a day for now. Med rec is corrected to document take Tikosyn 250 mg twice a day. We'll check a CBC. He will need to stop aspirin as he is on DOAC.   He will need to follow-up with Dr. Crissie Sickles in 6 months.  2. Hypotension: Blood decrease her isosorbide to 15 mg daily from 30 mg daily.  3. Chronic bronchitis: Remains on inhalers.  4. Anemia: He is on iron replacement. Checking CBC as he is on ELIQUIS.  5. Hypercholesterolemia: Continue statin therapy.   Current medicines are reviewed at length with the patient today.    Labs/ tests ordered today include:  Phill Myron. [place] Pugh, ANP, AACC   10/18/2016 4:47 PM    [facility] [address], [place], [place] [address] Phone: [phone]; Fax: [phone]

## 2016-10-18 NOTE — Patient Instructions (Signed)
Medication Instructions:  Your physician has recommended you make the following change in your medication:  Decrease Imdur to 15 mg Daily    Labwork: Your physician recommends that you return for lab work in: Today    Testing/Procedures: NONE  Follow-Up: Your physician recommends that you schedule a follow-up appointment in: 3 Months with Dr. Lovena Le    Any Other Special Instructions Will Be Listed Below (If Applicable).     If you need a refill on your cardiac medications before your next appointment, please call your pharmacy. Thank you for choosing [facility]!

## 2016-10-19 DIAGNOSIS — Z79899 Other long term (current) drug therapy: Secondary | ICD-10-CM | POA: Diagnosis not present

## 2016-10-19 DIAGNOSIS — I10 Essential (primary) hypertension: Secondary | ICD-10-CM | POA: Diagnosis not present

## 2016-10-19 DIAGNOSIS — I69322 Dysarthria following cerebral infarction: Secondary | ICD-10-CM | POA: Diagnosis not present

## 2016-10-19 DIAGNOSIS — D649 Anemia, unspecified: Secondary | ICD-10-CM | POA: Diagnosis not present

## 2016-10-19 IMAGING — RF DG SWALLOWING FUNCTION
1 series · 1 of 1 positions shown · non-contrast
Comparison: None

CLINICAL DATA: Dysphagia, recent pneumonia, history stroke

EXAM:
MODIFIED BARIUM SWALLOW
TECHNIQUE: Different consistencies of barium were administered orally to the
patient by the Speech Pathologist. Imaging of the pharynx was
performed in the lateral projection.
FLUOROSCOPY TIME:  Radiation Exposure Index (as provided by the
fluoroscopic device): Not provided
If the device does not provide the exposure index:
Fluoroscopy Time:  3 minutes 30 seconds
Number of Acquired Images: None; single screen capture during
fluoroscopy

[Series 1: run · 1 of 1 slices shown]
[im 1/1]
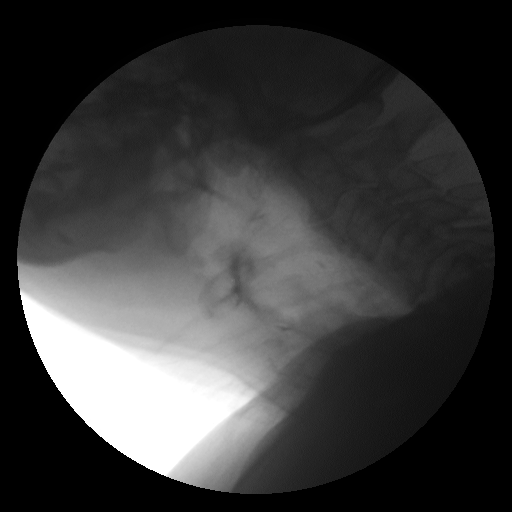

[1 of 1 positions shown; findings below may reference images not displayed]

FINDINGS: Thin liquid- with thin barium by cup, initial swallows demonstrated
flash laryngeal penetration without aspiration. As additional
swallows were evaluated, premature spillover of contrast to the
vallecular and piriform sinuses was identified with delayed
initiation of swallow. Laryngeal penetration and silent aspiration
of contrast were identified on several swallows. No spontaneous
cough reflex identified. Minimal vallecular residuals noted.

Nectar thick liquid- premature spillover to the vallecula. Delayed
initiation. Flash laryngeal penetration occurred on a single swallow
but did not recur. No aspiration or significant residuals.

Honey- not evaluated

Paulus N?Gottschalk slightly prolonged oral phase. No premature spillover or
delayed initiation. No laryngeal penetration or aspiration.
Vallecular residuals were noted, decreased with hard swallow.

Cracker-prolonged oral phase. Mild delayed initiation with spillover
to the vallecula. No laryngeal penetration or aspiration. Vallecular
residual noted.

Paulus N?Ckevin with cracker- within normal limits

Barium tablet -  not evaluated
IMPRESSION: Swallowing dysfunction particularly with thin liquids as above.

Please refer to the Speech Pathologists report for complete details
and recommendations.

## 2016-10-20 ENCOUNTER — Telehealth: Payer: Self-pay | Admitting: Adult Health

## 2016-10-20 NOTE — Telephone Encounter (Signed)
Alejandro Diaz called from [facility]--she has a question concerning some orders from his previous apt w/ [facility] [phone]

## 2016-10-20 NOTE — Telephone Encounter (Signed)
Received call from Nurse April at [facility]. They are able to obtain Tikosyn lab at [facility], [facility], [place], or [facility]. Please advise.

## 2016-10-21 NOTE — Telephone Encounter (Signed)
Can it be drawn across the street or at [facility]?

## 2016-10-22 NOTE — Telephone Encounter (Signed)
Cancel Tikosyn level lab.

## 2016-10-22 NOTE — Telephone Encounter (Signed)
Nurse at [facility] notified to cancel lab and voice understanding.

## 2016-10-22 NOTE — Telephone Encounter (Signed)
Correction to previous note Tikosyn is unable to be done at those labs. Please advise.

## 2016-11-01 IMAGING — CR DG CHEST 1V PORT
1 series · 2 of 2 positions shown · non-contrast
Comparison: Portable chest x-ray dated June 18, 2014

CLINICAL DATA: Chest pain, history of COPD, atrial fibrillation,
and diabetes ; history of coronary stent placement

EXAM:
PORTABLE CHEST - 1 VIEW

[Series 1: ap portable · 0.17mm/px · 2 of 2 slices shown]
[im 1/2]
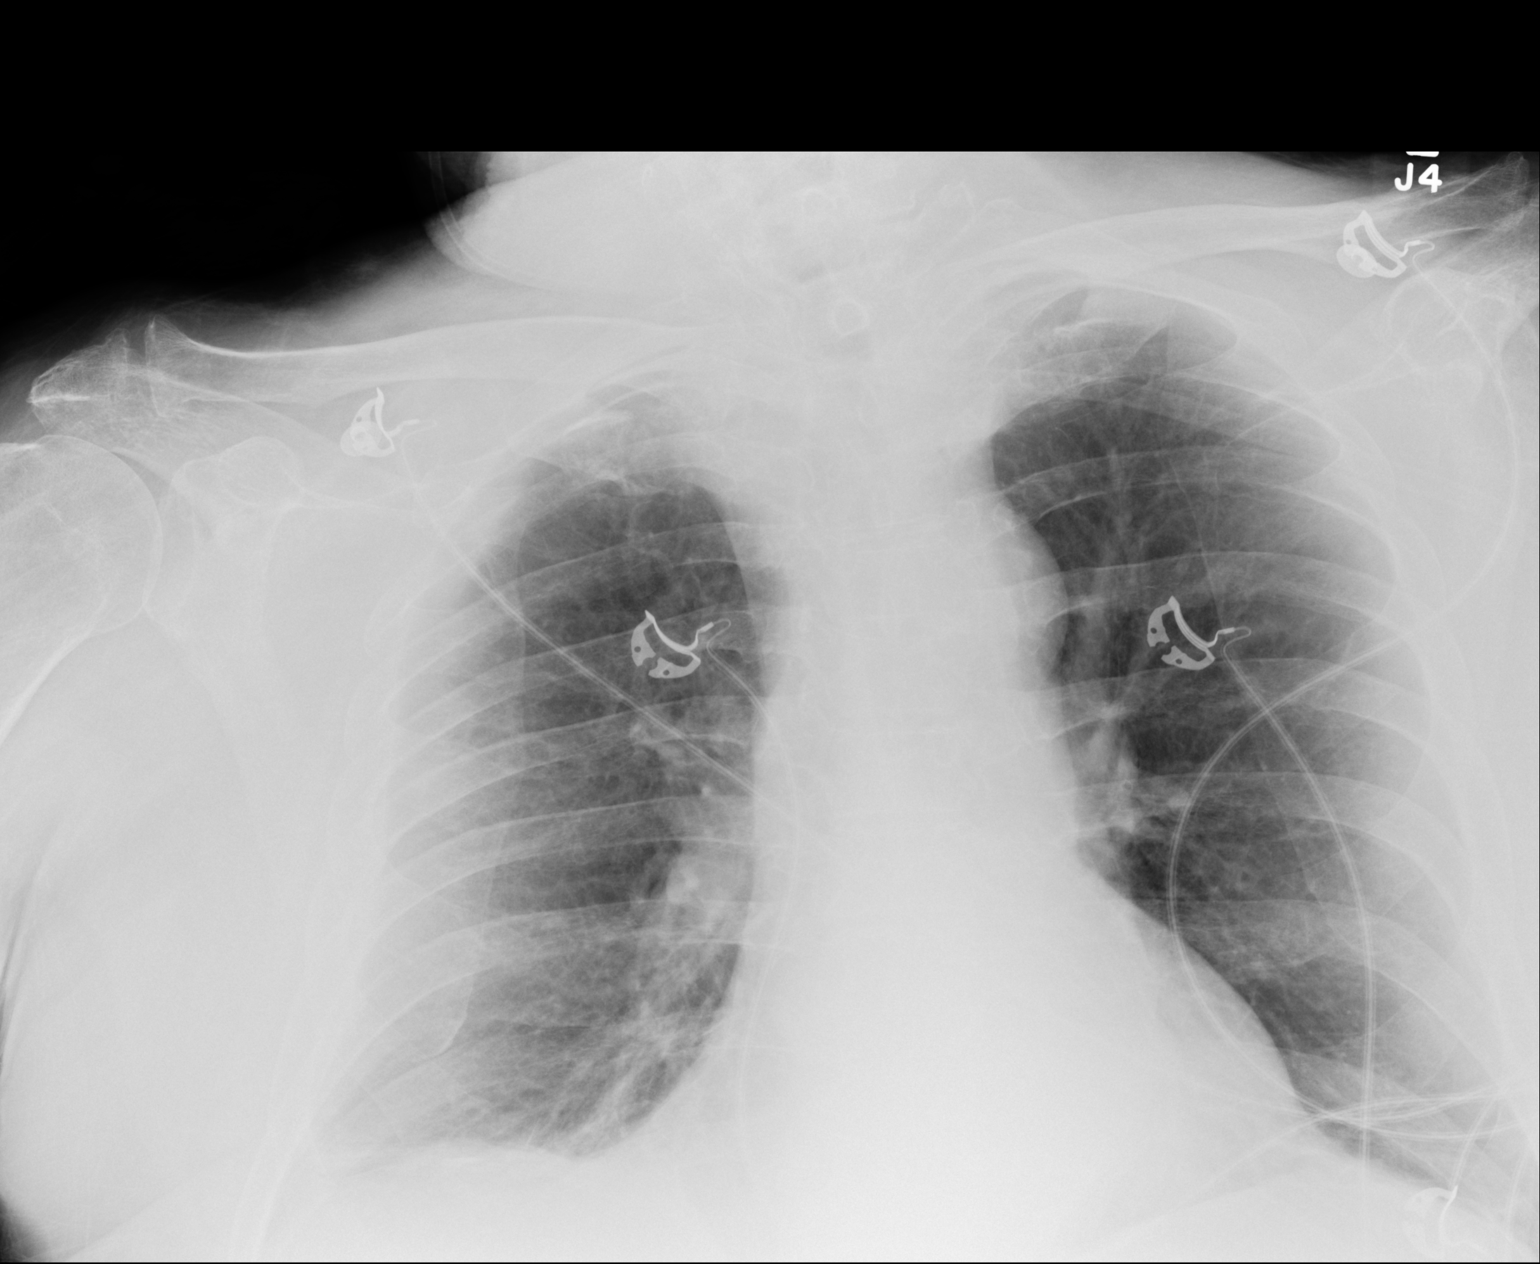
[im 2/2]
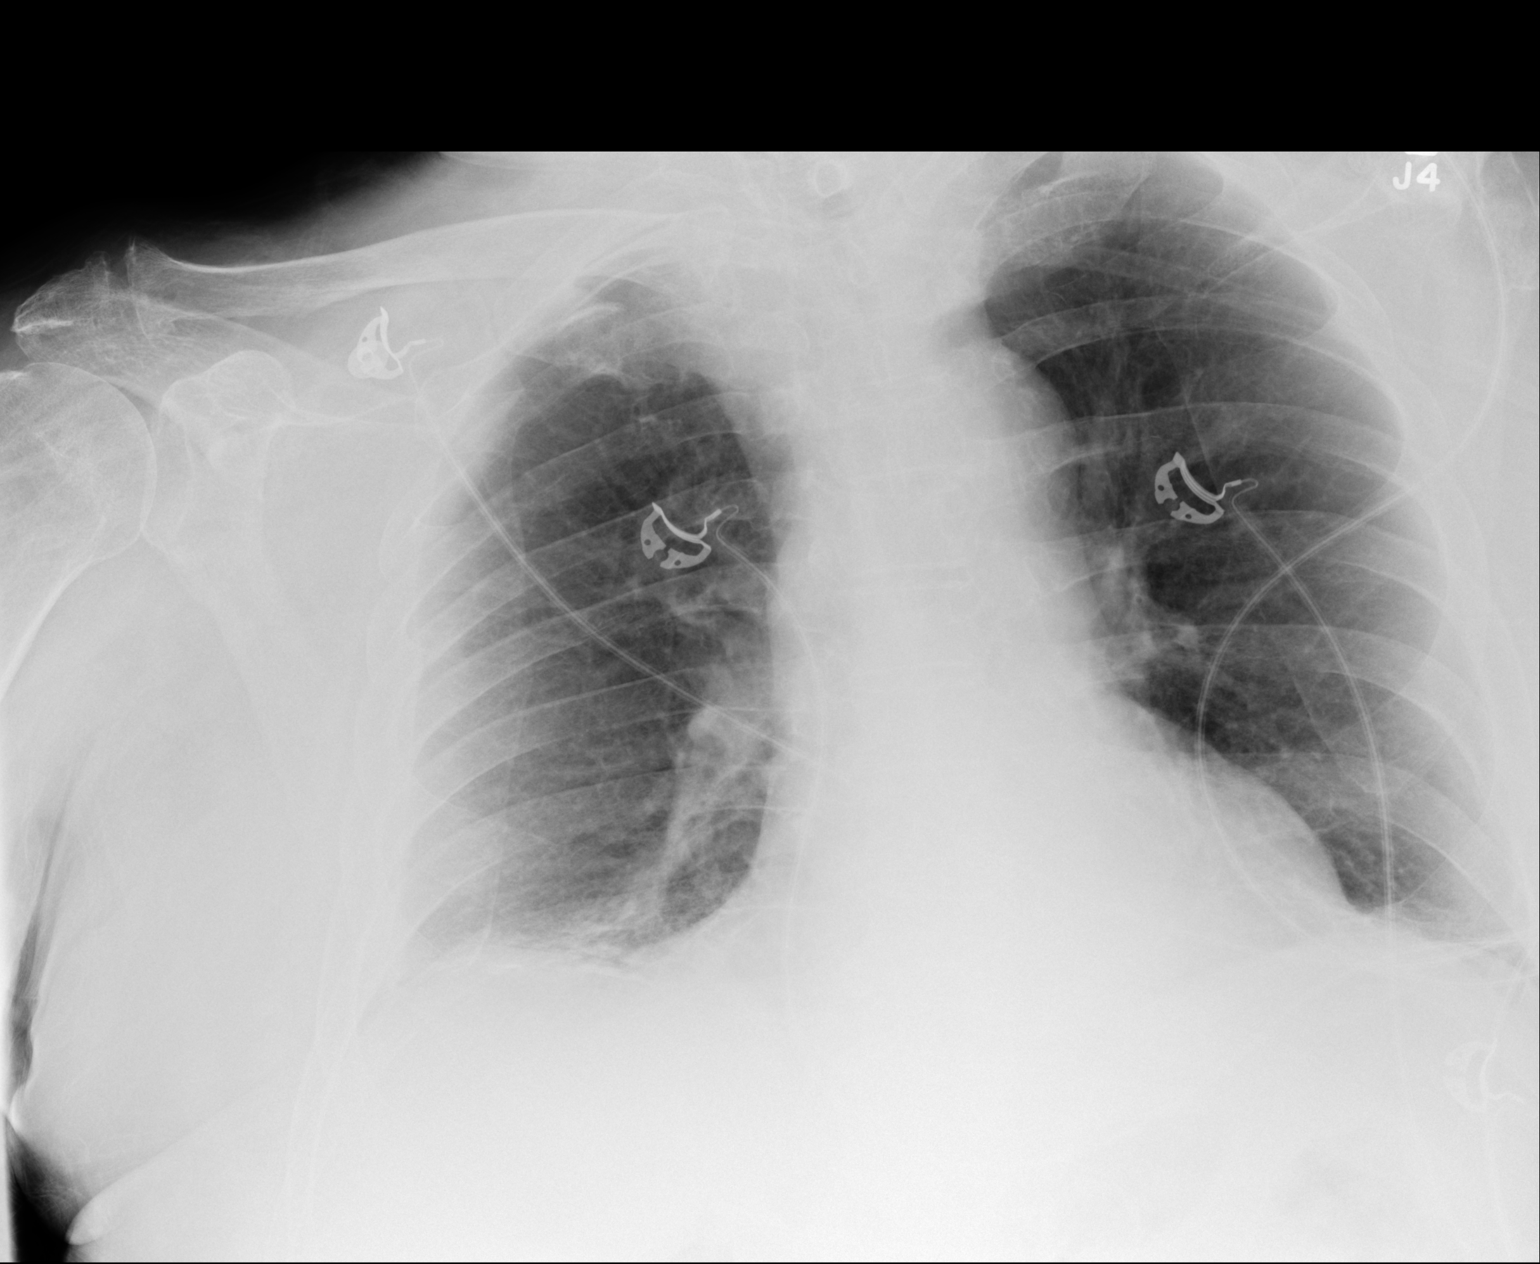

[2 of 2 positions shown; findings below may reference images not displayed]

FINDINGS: The lungs are adequately inflated. There is no focal infiltrate.
There is minimal subsegmental atelectasis or scarring at the right
lung base. The heart is top-normal in size. The pulmonary
vascularity is not engorged. The mediastinum is normal in width. No
significant pleural effusion is observed. The bony thorax exhibits
no acute abnormality.
IMPRESSION: COPD with minimal right basilar atelectasis or scarring. There is no
CHF nor pneumonia.

## 2016-11-11 DIAGNOSIS — D649 Anemia, unspecified: Secondary | ICD-10-CM | POA: Diagnosis not present

## 2016-11-11 DIAGNOSIS — I10 Essential (primary) hypertension: Secondary | ICD-10-CM | POA: Diagnosis not present

## 2016-11-11 DIAGNOSIS — M2550 Pain in unspecified joint: Secondary | ICD-10-CM | POA: Diagnosis not present

## 2016-12-08 DIAGNOSIS — I10 Essential (primary) hypertension: Secondary | ICD-10-CM | POA: Diagnosis not present

## 2016-12-08 DIAGNOSIS — D649 Anemia, unspecified: Secondary | ICD-10-CM | POA: Diagnosis not present

## 2016-12-08 DIAGNOSIS — M2550 Pain in unspecified joint: Secondary | ICD-10-CM | POA: Diagnosis not present

## 2016-12-09 DIAGNOSIS — I4891 Unspecified atrial fibrillation: Secondary | ICD-10-CM | POA: Diagnosis not present

## 2016-12-09 DIAGNOSIS — E785 Hyperlipidemia, unspecified: Secondary | ICD-10-CM | POA: Diagnosis not present

## 2016-12-09 DIAGNOSIS — I10 Essential (primary) hypertension: Secondary | ICD-10-CM | POA: Diagnosis not present

## 2017-01-05 DIAGNOSIS — I10 Essential (primary) hypertension: Secondary | ICD-10-CM | POA: Diagnosis not present

## 2017-01-05 DIAGNOSIS — M2550 Pain in unspecified joint: Secondary | ICD-10-CM | POA: Diagnosis not present

## 2017-01-05 DIAGNOSIS — D649 Anemia, unspecified: Secondary | ICD-10-CM | POA: Diagnosis not present

## 2017-01-05 DIAGNOSIS — E119 Type 2 diabetes mellitus without complications: Secondary | ICD-10-CM | POA: Diagnosis not present

## 2017-01-21 ENCOUNTER — Ambulatory Visit (INDEPENDENT_AMBULATORY_CARE_PROVIDER_SITE_OTHER): Payer: Medicare Other | Admitting: Internal Medicine

## 2017-01-21 ENCOUNTER — Encounter: Payer: Self-pay | Admitting: Internal Medicine

## 2017-01-21 VITALS — BP 124/64 | HR 58 | Ht 72.0 in | Wt 194.0 lb

## 2017-01-21 DIAGNOSIS — I4891 Unspecified atrial fibrillation: Secondary | ICD-10-CM | POA: Diagnosis not present

## 2017-01-21 NOTE — Progress Notes (Signed)
HPI [name] returns today for ongoing evaluation of atrial fibrillation and chronic Tikosyn therapy. He is a pleasant widowed man with a h/o atrial fib and stroke, who has moved from [employer] [place] to [facility]. He has a mild expressive aphasia which has improved. The patient was initially prescribed 500 q12 of Tikosyn and we reduced this when we saw him in the past. He has not been hospitalized in the interim with pneumonia. No Known Allergies   Current Outpatient Prescriptions  Medication Sig Dispense Refill  . albuterol (PROVENTIL HFA;VENTOLIN HFA) 108 (90 BASE) MCG/ACT inhaler Inhale 1-2 puffs into the lungs every 6 (six) hours as needed for wheezing or shortness of breath. 1 Inhaler 0  . albuterol (PROVENTIL) (2.5 MG/3ML) 0.083% nebulizer solution Take 3 mLs (2.5 mg total) by nebulization every 6 (six) hours as needed for Wheezing.    Marland Kitchen apixaban (ELIQUIS) 5 MG TABS tablet Take 5 mg by mouth 2 (two) times daily.     Marland Kitchen aspirin EC 81 MG tablet Take 81 mg by mouth daily.     . cholecalciferol (VITAMIN D) 1000 UNITS tablet Take 1,000 Units by mouth daily.     Marland Kitchen dofetilide (TIKOSYN) 250 MCG capsule Take 250 mcg by mouth 2 (two) times daily.    Marland Kitchen ENSURE (ENSURE) Take 237 mLs by mouth 3 (three) times daily.    . famotidine (PEPCID) 20 MG tablet Take 20 mg by mouth 2 (two) times daily.     . ferrous sulfate 325 (65 FE) MG tablet Take 325 mg by mouth 2 (two) times daily with a meal.    . Fluticasone Furoate-Vilanterol (BREO ELLIPTA) 100-25 MCG/INH AEPB Inhale 1 puff into the lungs daily.    Marland Kitchen glipiZIDE (GLUCOTROL XL) 5 MG 24 hr tablet TAKE 1 TABLET EVERY DAY oral    . glipiZIDE (GLUCOTROL) 5 MG tablet 5 mg by PEG Tube route daily before breakfast.    . HYDROcodone-acetaminophen (NORCO/VICODIN) 5-325 MG per tablet Take 1 tablet by mouth every 6 (six) hours as needed for moderate pain.     . isosorbide dinitrate (ISORDIL) 30 MG tablet 30 mg by PEG Tube route 3 (three) times daily.    Marland Kitchen  levothyroxine (SYNTHROID, LEVOTHROID) 137 MCG tablet Take 137 mcg by mouth daily before breakfast.    . Melatonin 3 MG TABS Take 3 mg by mouth at bedtime.    . metoprolol (LOPRESSOR) 100 MG tablet Take 100 mg by mouth 2 (two) times daily.     . Nutritional Supplements (FEEDING SUPPLEMENT, JEVITY 1.5 CAL,) LIQD Place 120 mLs into feeding tube 3 (three) times daily.    Marland Kitchen omeprazole (PRILOSEC) 20 MG capsule Take 20 mg by mouth 2 (two) times daily before a meal.    . OXYGEN Inhale 2 L into the lungs daily.    Marland Kitchen PARoxetine (PAXIL) 40 MG tablet Take 40 mg by mouth every evening.    . polyethylene glycol powder (MIRALAX) powder Take 1 Container by mouth once.    . pravastatin (PRAVACHOL) 40 MG tablet Take 40 mg by mouth at bedtime.     . traZODone (DESYREL) 100 MG tablet Take 100 mg by mouth at bedtime as needed for sleep.      No current facility-administered medications for this visit.      Past Medical History:  Diagnosis Date  . Arthritis   . Atrial fibrillation ([place])   . Atypical chest pain 12/02/2015  . CAD (coronary artery disease)  Stents to unknown arteries in 2006,2009  . Cancer ([place])    melanoma  . COPD (chronic obstructive pulmonary disease) ([place])   . CVA (cerebral infarction) 06/6801   Embolic Left sided hemiplegia.   . Diabetes mellitus without complication ([place])   . GERD (gastroesophageal reflux disease)   . Goals of care, counseling/discussion 12/02/2015  . Hemiplegia affecting left nondominant side ([place]) 12/02/2015  . Hip pain 03/04/2016  . Hypertension   . Ischemic cardiomyopathy    EF of 40-45%  . MRSA bacteremia 12/02/2015  . Rash and nonspecific skin eruption 12/02/2015    ROS:   All systems reviewed and negative except as noted in the HPI.   Past Surgical History:  Procedure Laterality Date  . BACK SURGERY    . BRAIN SURGERY    . CARDIAC SURGERY    . CORONARY STENT PLACEMENT    . PERCUTANEOUS ENDOSCOPIC GASTROSTOMY (PEG) REMOVAL N/A 03/19/2015    Procedure: PERCUTANEOUS ENDOSCOPIC GASTROSTOMY (PEG) REMOVAL;  Surgeon: Danie Binder, MD;  Location: [facility] SUITE;  Service: Endoscopy;  Laterality: N/A;  1200  . SPLENECTOMY       Family History  Problem Relation Age of Onset  . Heart attack Father      Social History   Social History  . Marital status: Married    Spouse name: N/A  . Number of children: N/A  . Years of education: N/A   Occupational History  . Not on file.   Social History Main Topics  . Smoking status: Former Smoker    Quit date: 03/22/2010  . Smokeless tobacco: Never Used  . Alcohol use No  . Drug use: No  . Sexual activity: Not on file   Other Topics Concern  . Not on file   Social History Narrative  . No narrative on file     BP 124/64 (BP Location: Right Arm)   Pulse (!) 58   Ht 6' (1.829 m)   Wt 194 lb (88 kg)   SpO2 91%   BMI 26.31 kg/m   Physical Exam:  Stable appearing elderly man, NAD HEENT: Unremarkable Neck:  No JVD, no thyromegally Lymphatics:  No adenopathy Back:  No CVA tenderness Lungs:  Clear with no wheezes HEART:  Regular rate rhythm, no murmurs, no rubs, no clicks Abd:  soft, positive bowel sounds, no organomegally, no rebound, no guarding Ext:  2 plus pulses, no edema, no cyanosis, no clubbing Skin:  No rashes no nodules, multiple scaling plaques Neuro:  CN II through XII intact, motor grossly intact  EKG - probable atrial fib  Assess/Plan: 1. PAF - he will continue dofetilide. I considered stopping but will continue for now. 2. HTN - he will continue his current meds. 3. Stroke - he is stable with residual expressive aphasia.  Mikle Bosworth.D.

## 2017-01-21 NOTE — Patient Instructions (Signed)
Medication Instructions:  Your physician recommends that you continue on your current medications as directed. Please refer to the Current Medication list given to you today.   Labwork: NONE   Testing/Procedures: NONE   Follow-Up: Your physician wants you to follow-up in: 6 MONTHS. You will receive a reminder letter in the mail two months in advance. If you don't receive a letter, please call our office to schedule the follow-up appointment.   Any Other Special Instructions Will Be Listed Below (If Applicable).     If you need a refill on your cardiac medications before your next appointment, please call your pharmacy. Thank you for choosing [facility]!    

## 2017-01-25 DIAGNOSIS — E039 Hypothyroidism, unspecified: Secondary | ICD-10-CM | POA: Diagnosis not present

## 2017-01-25 DIAGNOSIS — J449 Chronic obstructive pulmonary disease, unspecified: Secondary | ICD-10-CM | POA: Diagnosis not present

## 2017-01-25 DIAGNOSIS — I4891 Unspecified atrial fibrillation: Secondary | ICD-10-CM | POA: Diagnosis not present

## 2017-01-26 DIAGNOSIS — R05 Cough: Secondary | ICD-10-CM | POA: Diagnosis not present

## 2017-02-02 DIAGNOSIS — E119 Type 2 diabetes mellitus without complications: Secondary | ICD-10-CM | POA: Diagnosis not present

## 2017-02-02 DIAGNOSIS — D649 Anemia, unspecified: Secondary | ICD-10-CM | POA: Diagnosis not present

## 2017-02-02 DIAGNOSIS — M2550 Pain in unspecified joint: Secondary | ICD-10-CM | POA: Diagnosis not present

## 2017-02-21 DIAGNOSIS — E119 Type 2 diabetes mellitus without complications: Secondary | ICD-10-CM | POA: Diagnosis not present

## 2017-02-21 DIAGNOSIS — Z961 Presence of intraocular lens: Secondary | ICD-10-CM | POA: Diagnosis not present

## 2017-02-21 DIAGNOSIS — Z7984 Long term (current) use of oral hypoglycemic drugs: Secondary | ICD-10-CM | POA: Diagnosis not present

## 2017-02-21 DIAGNOSIS — Z7901 Long term (current) use of anticoagulants: Secondary | ICD-10-CM | POA: Diagnosis not present

## 2017-03-02 DIAGNOSIS — I10 Essential (primary) hypertension: Secondary | ICD-10-CM | POA: Diagnosis not present

## 2017-03-02 DIAGNOSIS — M2550 Pain in unspecified joint: Secondary | ICD-10-CM | POA: Diagnosis not present

## 2017-03-02 DIAGNOSIS — D649 Anemia, unspecified: Secondary | ICD-10-CM | POA: Diagnosis not present

## 2017-03-17 DIAGNOSIS — D649 Anemia, unspecified: Secondary | ICD-10-CM | POA: Diagnosis not present

## 2017-03-17 DIAGNOSIS — I10 Essential (primary) hypertension: Secondary | ICD-10-CM | POA: Diagnosis not present

## 2017-03-24 DIAGNOSIS — I10 Essential (primary) hypertension: Secondary | ICD-10-CM | POA: Diagnosis not present

## 2017-03-24 DIAGNOSIS — R52 Pain, unspecified: Secondary | ICD-10-CM | POA: Diagnosis not present

## 2017-03-24 DIAGNOSIS — F329 Major depressive disorder, single episode, unspecified: Secondary | ICD-10-CM | POA: Diagnosis not present

## 2017-03-24 DIAGNOSIS — I4891 Unspecified atrial fibrillation: Secondary | ICD-10-CM | POA: Diagnosis not present

## 2017-03-30 DIAGNOSIS — M2550 Pain in unspecified joint: Secondary | ICD-10-CM | POA: Diagnosis not present

## 2017-03-30 DIAGNOSIS — D649 Anemia, unspecified: Secondary | ICD-10-CM | POA: Diagnosis not present

## 2017-03-30 DIAGNOSIS — I10 Essential (primary) hypertension: Secondary | ICD-10-CM | POA: Diagnosis not present

## 2017-04-07 IMAGING — CT CT HEAD W/O CM
1 series · 15 of 30 positions shown, 19 images · non-contrast
Comparison: 04/17/2014.

CLINICAL DATA: 80-year-old who fell out of his wheelchair, striking
the back of the head earlier this evening, associated with loss of
consciousness. Prior history of right middle cerebral artery
distribution stroke resulting in left hemiparesis.

EXAM:
CT HEAD WITHOUT CONTRAST
TECHNIQUE: Contiguous axial images were obtained from the base of the skull
through the vertex without intravenous contrast.

[Series 3: headtrauma 4.8 h37s · axial · 0.48mm/px · z∈[+1299,+1457]mm · 15 of 36 slices shown, 19 images]
[im 2/36  brain]
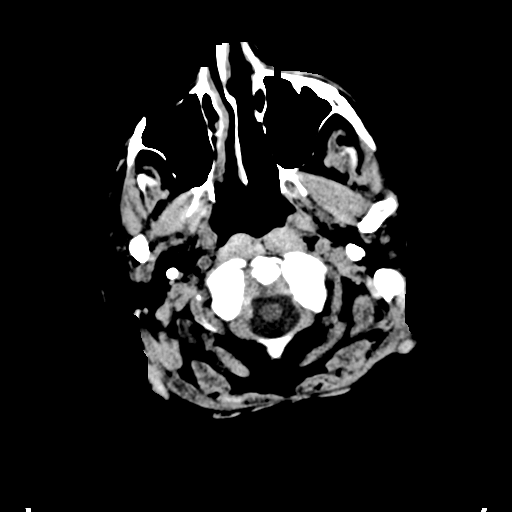
[im 2/36  bone]
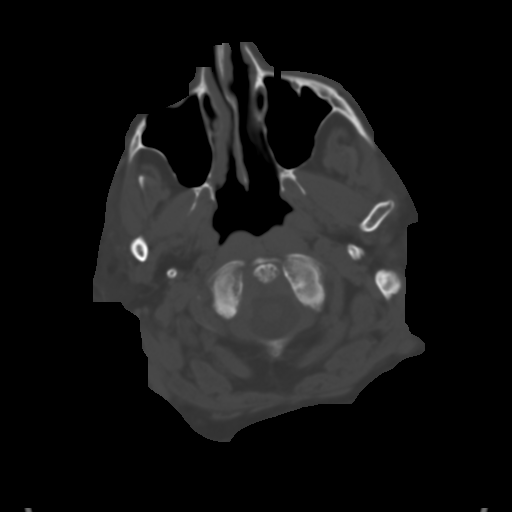
[im 4/36  brain]
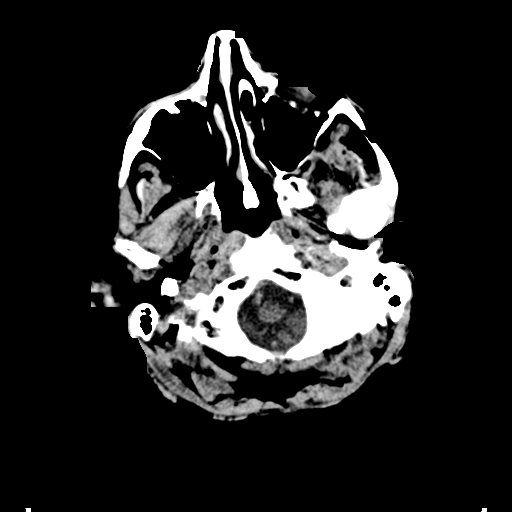
[im 7/36  brain]
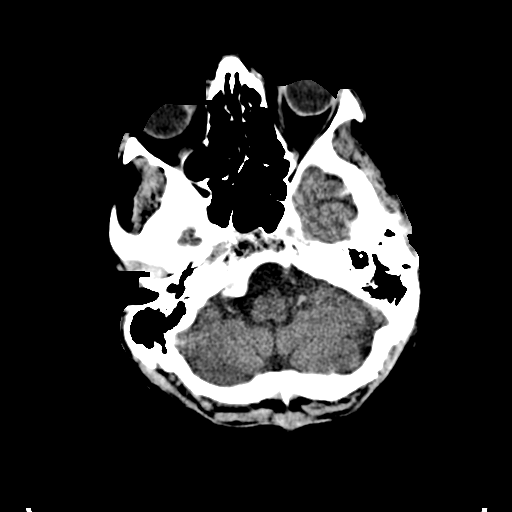
[im 9/36  brain]
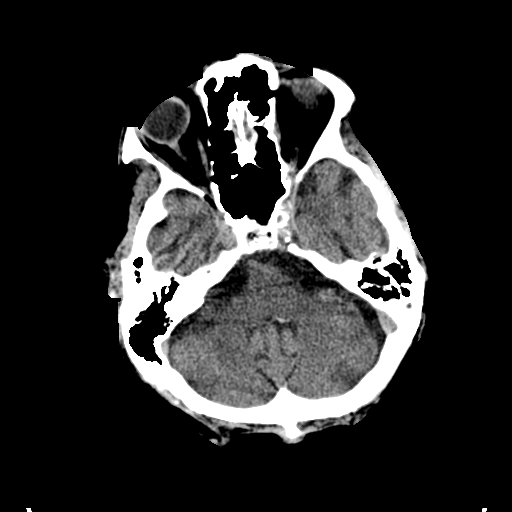
[im 11/36  brain]
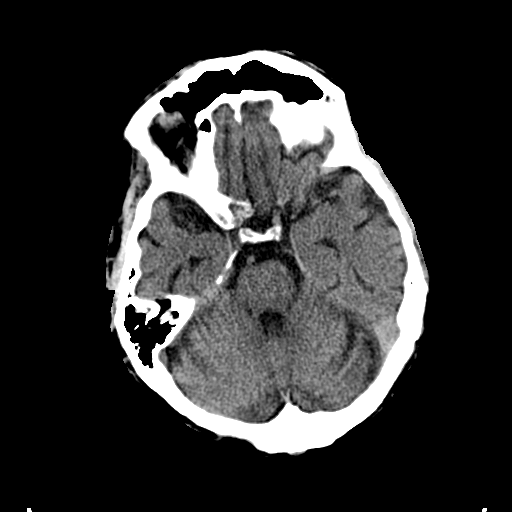
[im 11/36  bone]
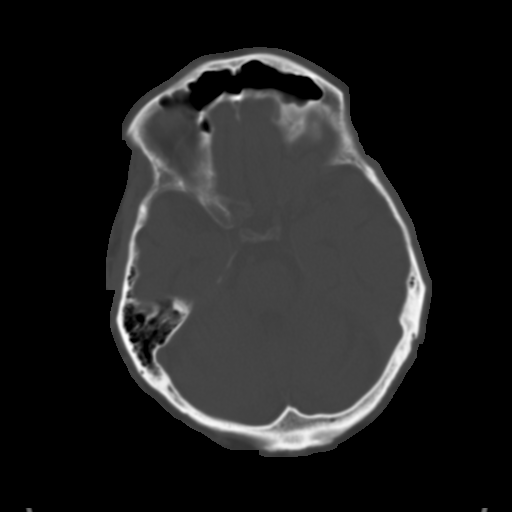
[im 14/36  brain]
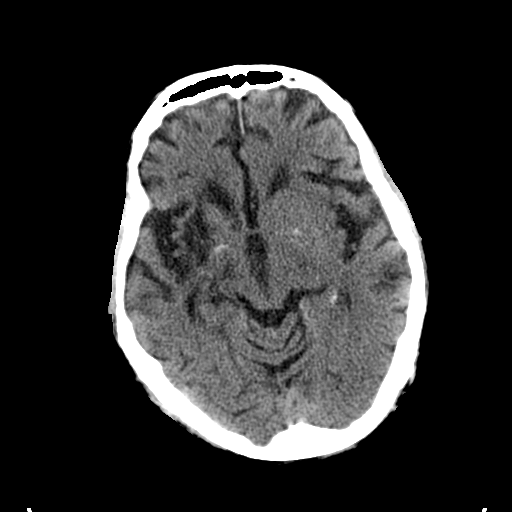
[im 16/36  brain]
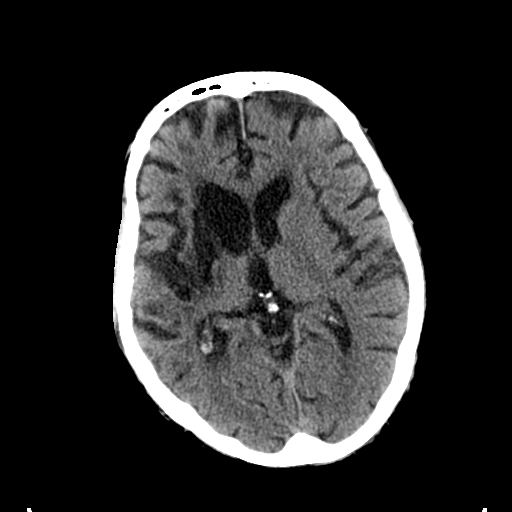
[im 19/36  brain]
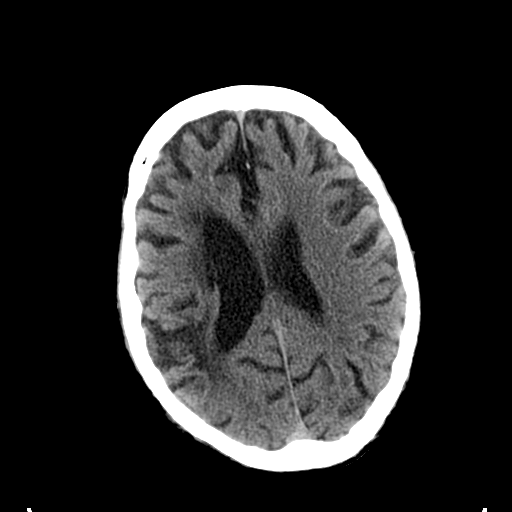
[im 20/36  brain]
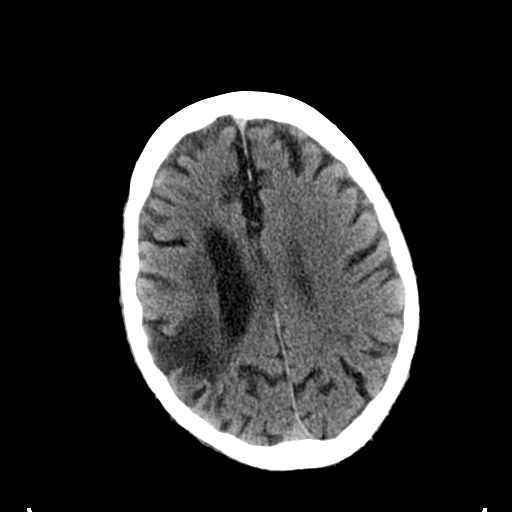
[im 20/36  bone]
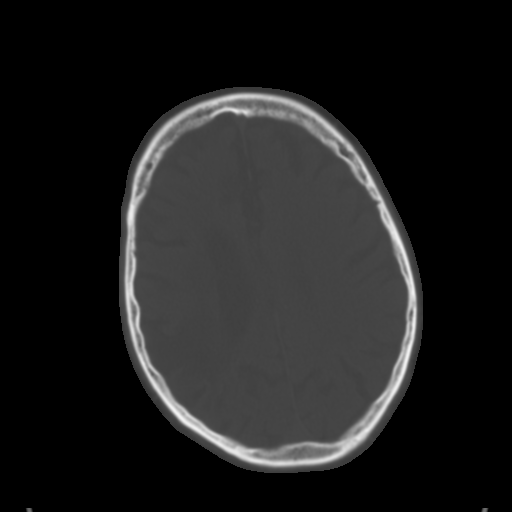
[im 22/36  brain]
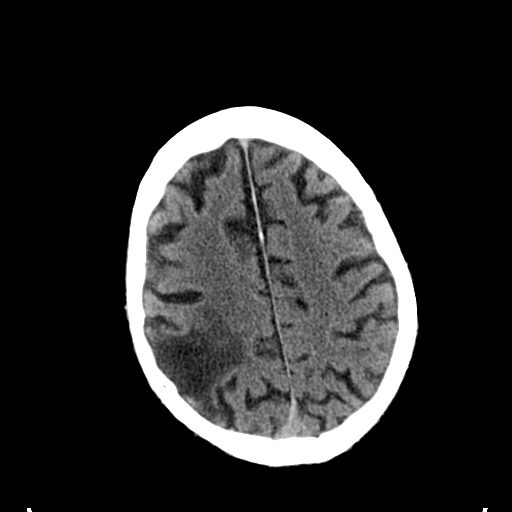
[im 25/36  brain]
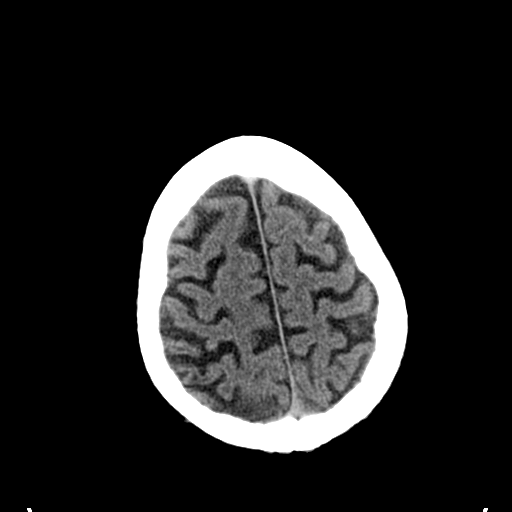
[im 27/36  brain]
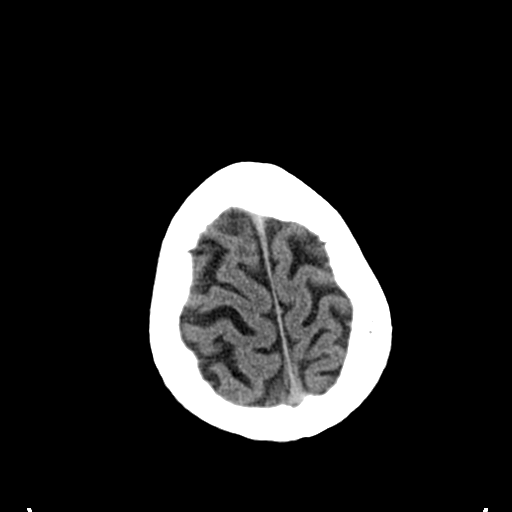
[im 29/36  brain]
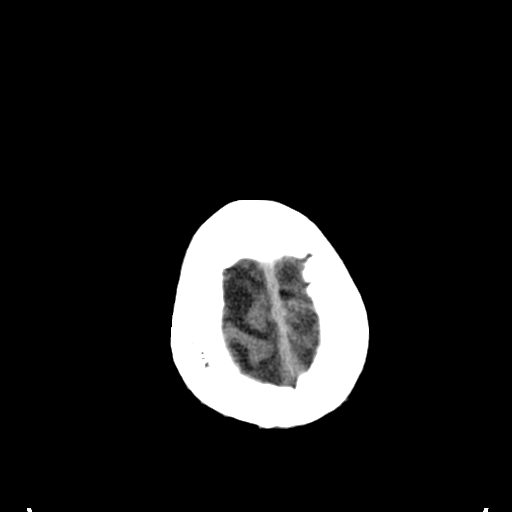
[im 29/36  bone]
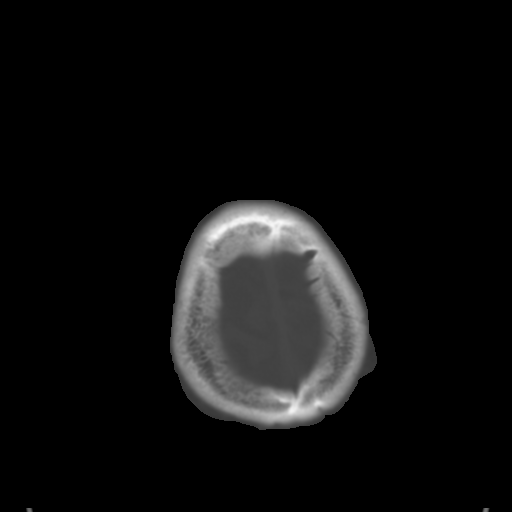
[im 32/36  brain]
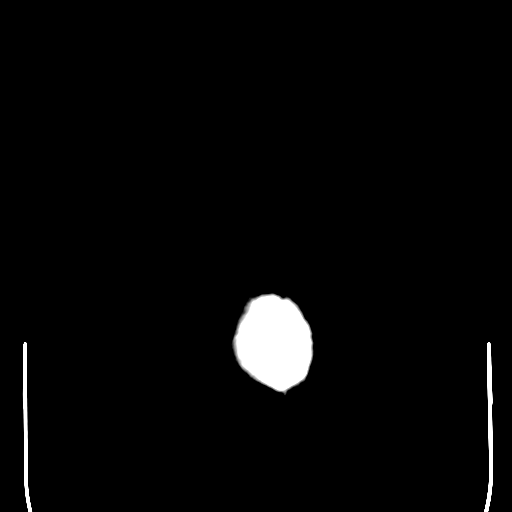
[im 34/36  brain]
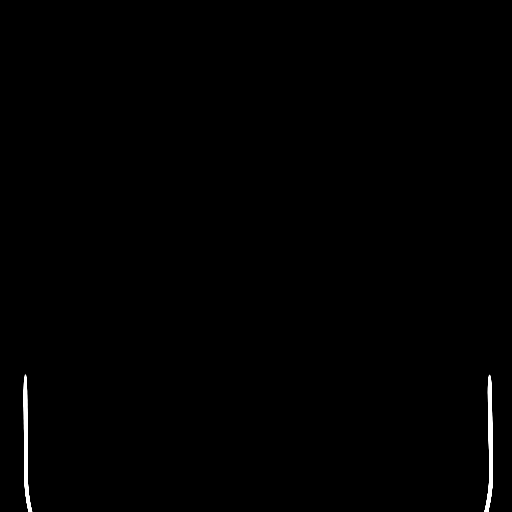

[15 of 30 positions shown; findings below may reference images not displayed]

FINDINGS: Moderate cortical, deep and cerebellar atrophy, unchanged. Remote
right middle cerebral artery distribution stroke with
encephalomalacia in the right basal ganglia, the right superior
temporal lobe, and the right parietal lobe. Enlargement of the right
lateral ventricle due to the right hemispheric encephalomalacia.
Physiologic calcifications in the basal ganglia, unchanged. No mass
lesion. No midline shift. No acute hemorrhage or hematoma. No
extra-axial fluid collections. No evidence of acute infarction.

No skull fracture or other focal osseous abnormality involving the
skull. Visualized paranasal sinuses, bilateral mastoid air cells and
bilateral middle ear cavities well-aerated. Bilateral carotid siphon
atherosclerosis.
IMPRESSION: 1. No acute intracranial abnormality.
2. Remote right middle cerebral artery distribution stroke.

## 2017-04-13 DIAGNOSIS — R41841 Cognitive communication deficit: Secondary | ICD-10-CM | POA: Diagnosis not present

## 2017-04-13 DIAGNOSIS — R1312 Dysphagia, oropharyngeal phase: Secondary | ICD-10-CM | POA: Diagnosis not present

## 2017-04-13 DIAGNOSIS — R471 Dysarthria and anarthria: Secondary | ICD-10-CM | POA: Diagnosis not present

## 2017-04-13 DIAGNOSIS — J449 Chronic obstructive pulmonary disease, unspecified: Secondary | ICD-10-CM | POA: Diagnosis not present

## 2017-04-14 DIAGNOSIS — J449 Chronic obstructive pulmonary disease, unspecified: Secondary | ICD-10-CM | POA: Diagnosis not present

## 2017-04-14 DIAGNOSIS — R1312 Dysphagia, oropharyngeal phase: Secondary | ICD-10-CM | POA: Diagnosis not present

## 2017-04-14 DIAGNOSIS — R41841 Cognitive communication deficit: Secondary | ICD-10-CM | POA: Diagnosis not present

## 2017-04-14 DIAGNOSIS — R471 Dysarthria and anarthria: Secondary | ICD-10-CM | POA: Diagnosis not present

## 2017-04-15 DIAGNOSIS — R41841 Cognitive communication deficit: Secondary | ICD-10-CM | POA: Diagnosis not present

## 2017-04-15 DIAGNOSIS — R471 Dysarthria and anarthria: Secondary | ICD-10-CM | POA: Diagnosis not present

## 2017-04-15 DIAGNOSIS — J449 Chronic obstructive pulmonary disease, unspecified: Secondary | ICD-10-CM | POA: Diagnosis not present

## 2017-04-15 DIAGNOSIS — R1312 Dysphagia, oropharyngeal phase: Secondary | ICD-10-CM | POA: Diagnosis not present

## 2017-04-17 DIAGNOSIS — J449 Chronic obstructive pulmonary disease, unspecified: Secondary | ICD-10-CM | POA: Diagnosis not present

## 2017-04-17 DIAGNOSIS — R41841 Cognitive communication deficit: Secondary | ICD-10-CM | POA: Diagnosis not present

## 2017-04-17 DIAGNOSIS — R1312 Dysphagia, oropharyngeal phase: Secondary | ICD-10-CM | POA: Diagnosis not present

## 2017-04-17 DIAGNOSIS — R471 Dysarthria and anarthria: Secondary | ICD-10-CM | POA: Diagnosis not present

## 2017-04-18 DIAGNOSIS — J441 Chronic obstructive pulmonary disease with (acute) exacerbation: Secondary | ICD-10-CM | POA: Diagnosis not present

## 2017-04-18 DIAGNOSIS — J449 Chronic obstructive pulmonary disease, unspecified: Secondary | ICD-10-CM | POA: Diagnosis not present

## 2017-04-18 DIAGNOSIS — R471 Dysarthria and anarthria: Secondary | ICD-10-CM | POA: Diagnosis not present

## 2017-04-18 DIAGNOSIS — R05 Cough: Secondary | ICD-10-CM | POA: Diagnosis not present

## 2017-04-18 DIAGNOSIS — R41841 Cognitive communication deficit: Secondary | ICD-10-CM | POA: Diagnosis not present

## 2017-04-18 DIAGNOSIS — R1312 Dysphagia, oropharyngeal phase: Secondary | ICD-10-CM | POA: Diagnosis not present

## 2017-04-19 DIAGNOSIS — D649 Anemia, unspecified: Secondary | ICD-10-CM | POA: Diagnosis not present

## 2017-04-19 DIAGNOSIS — R509 Fever, unspecified: Secondary | ICD-10-CM | POA: Diagnosis not present

## 2017-04-20 DIAGNOSIS — R41841 Cognitive communication deficit: Secondary | ICD-10-CM | POA: Diagnosis not present

## 2017-04-20 DIAGNOSIS — R1312 Dysphagia, oropharyngeal phase: Secondary | ICD-10-CM | POA: Diagnosis not present

## 2017-04-20 DIAGNOSIS — J441 Chronic obstructive pulmonary disease with (acute) exacerbation: Secondary | ICD-10-CM | POA: Diagnosis not present

## 2017-04-20 DIAGNOSIS — Z79899 Other long term (current) drug therapy: Secondary | ICD-10-CM | POA: Diagnosis not present

## 2017-04-20 DIAGNOSIS — R319 Hematuria, unspecified: Secondary | ICD-10-CM | POA: Diagnosis not present

## 2017-04-20 DIAGNOSIS — N390 Urinary tract infection, site not specified: Secondary | ICD-10-CM | POA: Diagnosis not present

## 2017-04-20 DIAGNOSIS — R471 Dysarthria and anarthria: Secondary | ICD-10-CM | POA: Diagnosis not present

## 2017-04-20 DIAGNOSIS — J449 Chronic obstructive pulmonary disease, unspecified: Secondary | ICD-10-CM | POA: Diagnosis not present

## 2017-04-20 DIAGNOSIS — R05 Cough: Secondary | ICD-10-CM | POA: Diagnosis not present

## 2017-04-20 DIAGNOSIS — R509 Fever, unspecified: Secondary | ICD-10-CM | POA: Diagnosis not present

## 2017-04-21 DIAGNOSIS — D649 Anemia, unspecified: Secondary | ICD-10-CM | POA: Diagnosis not present

## 2017-04-21 DIAGNOSIS — R471 Dysarthria and anarthria: Secondary | ICD-10-CM | POA: Diagnosis not present

## 2017-04-21 DIAGNOSIS — I10 Essential (primary) hypertension: Secondary | ICD-10-CM | POA: Diagnosis not present

## 2017-04-21 DIAGNOSIS — R1312 Dysphagia, oropharyngeal phase: Secondary | ICD-10-CM | POA: Diagnosis not present

## 2017-04-21 DIAGNOSIS — J449 Chronic obstructive pulmonary disease, unspecified: Secondary | ICD-10-CM | POA: Diagnosis not present

## 2017-04-21 DIAGNOSIS — R41841 Cognitive communication deficit: Secondary | ICD-10-CM | POA: Diagnosis not present

## 2017-04-22 DIAGNOSIS — R1312 Dysphagia, oropharyngeal phase: Secondary | ICD-10-CM | POA: Diagnosis not present

## 2017-04-22 DIAGNOSIS — J449 Chronic obstructive pulmonary disease, unspecified: Secondary | ICD-10-CM | POA: Diagnosis not present

## 2017-04-22 DIAGNOSIS — R471 Dysarthria and anarthria: Secondary | ICD-10-CM | POA: Diagnosis not present

## 2017-04-22 DIAGNOSIS — R41841 Cognitive communication deficit: Secondary | ICD-10-CM | POA: Diagnosis not present

## 2017-04-25 DIAGNOSIS — J449 Chronic obstructive pulmonary disease, unspecified: Secondary | ICD-10-CM | POA: Diagnosis not present

## 2017-04-25 DIAGNOSIS — R471 Dysarthria and anarthria: Secondary | ICD-10-CM | POA: Diagnosis not present

## 2017-04-25 DIAGNOSIS — R41841 Cognitive communication deficit: Secondary | ICD-10-CM | POA: Diagnosis not present

## 2017-04-25 DIAGNOSIS — N390 Urinary tract infection, site not specified: Secondary | ICD-10-CM | POA: Diagnosis not present

## 2017-04-25 DIAGNOSIS — R1312 Dysphagia, oropharyngeal phase: Secondary | ICD-10-CM | POA: Diagnosis not present

## 2017-04-25 DIAGNOSIS — J181 Lobar pneumonia, unspecified organism: Secondary | ICD-10-CM | POA: Diagnosis not present

## 2017-04-25 DIAGNOSIS — B9562 Methicillin resistant Staphylococcus aureus infection as the cause of diseases classified elsewhere: Secondary | ICD-10-CM | POA: Diagnosis not present

## 2017-04-25 DIAGNOSIS — J441 Chronic obstructive pulmonary disease with (acute) exacerbation: Secondary | ICD-10-CM | POA: Diagnosis not present

## 2017-04-26 DIAGNOSIS — R1312 Dysphagia, oropharyngeal phase: Secondary | ICD-10-CM | POA: Diagnosis not present

## 2017-04-26 DIAGNOSIS — R41841 Cognitive communication deficit: Secondary | ICD-10-CM | POA: Diagnosis not present

## 2017-04-26 DIAGNOSIS — J449 Chronic obstructive pulmonary disease, unspecified: Secondary | ICD-10-CM | POA: Diagnosis not present

## 2017-04-26 DIAGNOSIS — R471 Dysarthria and anarthria: Secondary | ICD-10-CM | POA: Diagnosis not present

## 2017-04-27 DIAGNOSIS — J449 Chronic obstructive pulmonary disease, unspecified: Secondary | ICD-10-CM | POA: Diagnosis not present

## 2017-04-27 DIAGNOSIS — R1312 Dysphagia, oropharyngeal phase: Secondary | ICD-10-CM | POA: Diagnosis not present

## 2017-04-27 DIAGNOSIS — R41841 Cognitive communication deficit: Secondary | ICD-10-CM | POA: Diagnosis not present

## 2017-04-27 DIAGNOSIS — R471 Dysarthria and anarthria: Secondary | ICD-10-CM | POA: Diagnosis not present

## 2017-04-28 DIAGNOSIS — J449 Chronic obstructive pulmonary disease, unspecified: Secondary | ICD-10-CM | POA: Diagnosis not present

## 2017-04-28 DIAGNOSIS — R1312 Dysphagia, oropharyngeal phase: Secondary | ICD-10-CM | POA: Diagnosis not present

## 2017-04-28 DIAGNOSIS — R471 Dysarthria and anarthria: Secondary | ICD-10-CM | POA: Diagnosis not present

## 2017-04-28 DIAGNOSIS — R41841 Cognitive communication deficit: Secondary | ICD-10-CM | POA: Diagnosis not present

## 2017-04-29 DIAGNOSIS — R1312 Dysphagia, oropharyngeal phase: Secondary | ICD-10-CM | POA: Diagnosis not present

## 2017-04-29 DIAGNOSIS — R41841 Cognitive communication deficit: Secondary | ICD-10-CM | POA: Diagnosis not present

## 2017-04-29 DIAGNOSIS — R471 Dysarthria and anarthria: Secondary | ICD-10-CM | POA: Diagnosis not present

## 2017-04-29 DIAGNOSIS — J449 Chronic obstructive pulmonary disease, unspecified: Secondary | ICD-10-CM | POA: Diagnosis not present

## 2017-05-02 DIAGNOSIS — N390 Urinary tract infection, site not specified: Secondary | ICD-10-CM | POA: Diagnosis not present

## 2017-05-02 DIAGNOSIS — R1312 Dysphagia, oropharyngeal phase: Secondary | ICD-10-CM | POA: Diagnosis not present

## 2017-05-02 DIAGNOSIS — R41841 Cognitive communication deficit: Secondary | ICD-10-CM | POA: Diagnosis not present

## 2017-05-02 DIAGNOSIS — J181 Lobar pneumonia, unspecified organism: Secondary | ICD-10-CM | POA: Diagnosis not present

## 2017-05-02 DIAGNOSIS — J449 Chronic obstructive pulmonary disease, unspecified: Secondary | ICD-10-CM | POA: Diagnosis not present

## 2017-05-02 DIAGNOSIS — R471 Dysarthria and anarthria: Secondary | ICD-10-CM | POA: Diagnosis not present

## 2017-05-02 DIAGNOSIS — J984 Other disorders of lung: Secondary | ICD-10-CM | POA: Diagnosis not present

## 2017-05-02 DIAGNOSIS — R339 Retention of urine, unspecified: Secondary | ICD-10-CM | POA: Diagnosis not present

## 2017-05-03 DIAGNOSIS — R471 Dysarthria and anarthria: Secondary | ICD-10-CM | POA: Diagnosis not present

## 2017-05-03 DIAGNOSIS — R1312 Dysphagia, oropharyngeal phase: Secondary | ICD-10-CM | POA: Diagnosis not present

## 2017-05-03 DIAGNOSIS — R509 Fever, unspecified: Secondary | ICD-10-CM | POA: Diagnosis not present

## 2017-05-03 DIAGNOSIS — J449 Chronic obstructive pulmonary disease, unspecified: Secondary | ICD-10-CM | POA: Diagnosis not present

## 2017-05-03 DIAGNOSIS — R319 Hematuria, unspecified: Secondary | ICD-10-CM | POA: Diagnosis not present

## 2017-05-03 DIAGNOSIS — R41841 Cognitive communication deficit: Secondary | ICD-10-CM | POA: Diagnosis not present

## 2017-05-04 DIAGNOSIS — R41841 Cognitive communication deficit: Secondary | ICD-10-CM | POA: Diagnosis not present

## 2017-05-04 DIAGNOSIS — R1312 Dysphagia, oropharyngeal phase: Secondary | ICD-10-CM | POA: Diagnosis not present

## 2017-05-04 DIAGNOSIS — J449 Chronic obstructive pulmonary disease, unspecified: Secondary | ICD-10-CM | POA: Diagnosis not present

## 2017-05-04 DIAGNOSIS — R471 Dysarthria and anarthria: Secondary | ICD-10-CM | POA: Diagnosis not present

## 2017-05-05 DIAGNOSIS — R1312 Dysphagia, oropharyngeal phase: Secondary | ICD-10-CM | POA: Diagnosis not present

## 2017-05-05 DIAGNOSIS — R41841 Cognitive communication deficit: Secondary | ICD-10-CM | POA: Diagnosis not present

## 2017-05-05 DIAGNOSIS — J449 Chronic obstructive pulmonary disease, unspecified: Secondary | ICD-10-CM | POA: Diagnosis not present

## 2017-05-05 DIAGNOSIS — R471 Dysarthria and anarthria: Secondary | ICD-10-CM | POA: Diagnosis not present

## 2017-05-07 DIAGNOSIS — R41841 Cognitive communication deficit: Secondary | ICD-10-CM | POA: Diagnosis not present

## 2017-05-07 DIAGNOSIS — J449 Chronic obstructive pulmonary disease, unspecified: Secondary | ICD-10-CM | POA: Diagnosis not present

## 2017-05-07 DIAGNOSIS — R1312 Dysphagia, oropharyngeal phase: Secondary | ICD-10-CM | POA: Diagnosis not present

## 2017-05-07 DIAGNOSIS — R471 Dysarthria and anarthria: Secondary | ICD-10-CM | POA: Diagnosis not present

## 2017-05-09 DIAGNOSIS — N390 Urinary tract infection, site not specified: Secondary | ICD-10-CM | POA: Diagnosis not present

## 2017-05-09 DIAGNOSIS — B9562 Methicillin resistant Staphylococcus aureus infection as the cause of diseases classified elsewhere: Secondary | ICD-10-CM | POA: Diagnosis not present

## 2017-05-09 DIAGNOSIS — R1312 Dysphagia, oropharyngeal phase: Secondary | ICD-10-CM | POA: Diagnosis not present

## 2017-05-09 DIAGNOSIS — N400 Enlarged prostate without lower urinary tract symptoms: Secondary | ICD-10-CM | POA: Diagnosis not present

## 2017-05-09 DIAGNOSIS — J449 Chronic obstructive pulmonary disease, unspecified: Secondary | ICD-10-CM | POA: Diagnosis not present

## 2017-05-09 DIAGNOSIS — J181 Lobar pneumonia, unspecified organism: Secondary | ICD-10-CM | POA: Diagnosis not present

## 2017-05-09 DIAGNOSIS — R41841 Cognitive communication deficit: Secondary | ICD-10-CM | POA: Diagnosis not present

## 2017-05-09 DIAGNOSIS — R471 Dysarthria and anarthria: Secondary | ICD-10-CM | POA: Diagnosis not present

## 2017-05-10 DIAGNOSIS — R471 Dysarthria and anarthria: Secondary | ICD-10-CM | POA: Diagnosis not present

## 2017-05-10 DIAGNOSIS — R1312 Dysphagia, oropharyngeal phase: Secondary | ICD-10-CM | POA: Diagnosis not present

## 2017-05-10 DIAGNOSIS — R41841 Cognitive communication deficit: Secondary | ICD-10-CM | POA: Diagnosis not present

## 2017-05-10 DIAGNOSIS — J449 Chronic obstructive pulmonary disease, unspecified: Secondary | ICD-10-CM | POA: Diagnosis not present

## 2017-05-12 DIAGNOSIS — J449 Chronic obstructive pulmonary disease, unspecified: Secondary | ICD-10-CM | POA: Diagnosis not present

## 2017-05-12 DIAGNOSIS — R1312 Dysphagia, oropharyngeal phase: Secondary | ICD-10-CM | POA: Diagnosis not present

## 2017-05-12 DIAGNOSIS — R471 Dysarthria and anarthria: Secondary | ICD-10-CM | POA: Diagnosis not present

## 2017-05-12 DIAGNOSIS — J984 Other disorders of lung: Secondary | ICD-10-CM | POA: Diagnosis not present

## 2017-05-12 DIAGNOSIS — R41841 Cognitive communication deficit: Secondary | ICD-10-CM | POA: Diagnosis not present

## 2017-05-13 DIAGNOSIS — R1312 Dysphagia, oropharyngeal phase: Secondary | ICD-10-CM | POA: Diagnosis not present

## 2017-05-13 DIAGNOSIS — R471 Dysarthria and anarthria: Secondary | ICD-10-CM | POA: Diagnosis not present

## 2017-05-13 DIAGNOSIS — D649 Anemia, unspecified: Secondary | ICD-10-CM | POA: Diagnosis not present

## 2017-05-13 DIAGNOSIS — J449 Chronic obstructive pulmonary disease, unspecified: Secondary | ICD-10-CM | POA: Diagnosis not present

## 2017-05-13 DIAGNOSIS — I10 Essential (primary) hypertension: Secondary | ICD-10-CM | POA: Diagnosis not present

## 2017-05-13 DIAGNOSIS — R41841 Cognitive communication deficit: Secondary | ICD-10-CM | POA: Diagnosis not present

## 2017-05-16 DIAGNOSIS — R1312 Dysphagia, oropharyngeal phase: Secondary | ICD-10-CM | POA: Diagnosis not present

## 2017-05-16 DIAGNOSIS — R41841 Cognitive communication deficit: Secondary | ICD-10-CM | POA: Diagnosis not present

## 2017-05-16 DIAGNOSIS — R471 Dysarthria and anarthria: Secondary | ICD-10-CM | POA: Diagnosis not present

## 2017-05-16 DIAGNOSIS — J449 Chronic obstructive pulmonary disease, unspecified: Secondary | ICD-10-CM | POA: Diagnosis not present

## 2017-05-17 DIAGNOSIS — R471 Dysarthria and anarthria: Secondary | ICD-10-CM | POA: Diagnosis not present

## 2017-05-17 DIAGNOSIS — R41841 Cognitive communication deficit: Secondary | ICD-10-CM | POA: Diagnosis not present

## 2017-05-17 DIAGNOSIS — R1312 Dysphagia, oropharyngeal phase: Secondary | ICD-10-CM | POA: Diagnosis not present

## 2017-05-17 DIAGNOSIS — J449 Chronic obstructive pulmonary disease, unspecified: Secondary | ICD-10-CM | POA: Diagnosis not present

## 2017-05-18 DIAGNOSIS — R471 Dysarthria and anarthria: Secondary | ICD-10-CM | POA: Diagnosis not present

## 2017-05-18 DIAGNOSIS — J449 Chronic obstructive pulmonary disease, unspecified: Secondary | ICD-10-CM | POA: Diagnosis not present

## 2017-05-18 DIAGNOSIS — R1312 Dysphagia, oropharyngeal phase: Secondary | ICD-10-CM | POA: Diagnosis not present

## 2017-05-18 DIAGNOSIS — R41841 Cognitive communication deficit: Secondary | ICD-10-CM | POA: Diagnosis not present

## 2017-05-19 DIAGNOSIS — J449 Chronic obstructive pulmonary disease, unspecified: Secondary | ICD-10-CM | POA: Diagnosis not present

## 2017-05-19 DIAGNOSIS — R1312 Dysphagia, oropharyngeal phase: Secondary | ICD-10-CM | POA: Diagnosis not present

## 2017-05-19 DIAGNOSIS — R471 Dysarthria and anarthria: Secondary | ICD-10-CM | POA: Diagnosis not present

## 2017-05-19 DIAGNOSIS — R41841 Cognitive communication deficit: Secondary | ICD-10-CM | POA: Diagnosis not present

## 2017-05-20 DIAGNOSIS — A498 Other bacterial infections of unspecified site: Secondary | ICD-10-CM | POA: Diagnosis not present

## 2017-05-20 DIAGNOSIS — D649 Anemia, unspecified: Secondary | ICD-10-CM | POA: Diagnosis not present

## 2017-05-20 DIAGNOSIS — R41841 Cognitive communication deficit: Secondary | ICD-10-CM | POA: Diagnosis not present

## 2017-05-20 DIAGNOSIS — J189 Pneumonia, unspecified organism: Secondary | ICD-10-CM | POA: Diagnosis not present

## 2017-05-20 DIAGNOSIS — R1312 Dysphagia, oropharyngeal phase: Secondary | ICD-10-CM | POA: Diagnosis not present

## 2017-05-20 DIAGNOSIS — J449 Chronic obstructive pulmonary disease, unspecified: Secondary | ICD-10-CM | POA: Diagnosis not present

## 2017-05-20 DIAGNOSIS — R471 Dysarthria and anarthria: Secondary | ICD-10-CM | POA: Diagnosis not present

## 2017-05-20 DIAGNOSIS — I10 Essential (primary) hypertension: Secondary | ICD-10-CM | POA: Diagnosis not present

## 2017-05-23 DIAGNOSIS — R1312 Dysphagia, oropharyngeal phase: Secondary | ICD-10-CM | POA: Diagnosis not present

## 2017-05-23 DIAGNOSIS — J449 Chronic obstructive pulmonary disease, unspecified: Secondary | ICD-10-CM | POA: Diagnosis not present

## 2017-05-23 DIAGNOSIS — R471 Dysarthria and anarthria: Secondary | ICD-10-CM | POA: Diagnosis not present

## 2017-05-23 DIAGNOSIS — R41841 Cognitive communication deficit: Secondary | ICD-10-CM | POA: Diagnosis not present

## 2017-05-24 DIAGNOSIS — J449 Chronic obstructive pulmonary disease, unspecified: Secondary | ICD-10-CM | POA: Diagnosis not present

## 2017-05-24 DIAGNOSIS — R41841 Cognitive communication deficit: Secondary | ICD-10-CM | POA: Diagnosis not present

## 2017-05-24 DIAGNOSIS — I2510 Atherosclerotic heart disease of native coronary artery without angina pectoris: Secondary | ICD-10-CM | POA: Diagnosis not present

## 2017-05-24 DIAGNOSIS — R52 Pain, unspecified: Secondary | ICD-10-CM | POA: Diagnosis not present

## 2017-05-24 DIAGNOSIS — R471 Dysarthria and anarthria: Secondary | ICD-10-CM | POA: Diagnosis not present

## 2017-05-24 DIAGNOSIS — F329 Major depressive disorder, single episode, unspecified: Secondary | ICD-10-CM | POA: Diagnosis not present

## 2017-05-24 DIAGNOSIS — I4891 Unspecified atrial fibrillation: Secondary | ICD-10-CM | POA: Diagnosis not present

## 2017-05-24 DIAGNOSIS — R1312 Dysphagia, oropharyngeal phase: Secondary | ICD-10-CM | POA: Diagnosis not present

## 2017-05-25 DIAGNOSIS — R1312 Dysphagia, oropharyngeal phase: Secondary | ICD-10-CM | POA: Diagnosis not present

## 2017-05-25 DIAGNOSIS — R41841 Cognitive communication deficit: Secondary | ICD-10-CM | POA: Diagnosis not present

## 2017-05-25 DIAGNOSIS — J449 Chronic obstructive pulmonary disease, unspecified: Secondary | ICD-10-CM | POA: Diagnosis not present

## 2017-05-25 DIAGNOSIS — R471 Dysarthria and anarthria: Secondary | ICD-10-CM | POA: Diagnosis not present

## 2017-05-26 DIAGNOSIS — R471 Dysarthria and anarthria: Secondary | ICD-10-CM | POA: Diagnosis not present

## 2017-05-26 DIAGNOSIS — R41841 Cognitive communication deficit: Secondary | ICD-10-CM | POA: Diagnosis not present

## 2017-05-26 DIAGNOSIS — J449 Chronic obstructive pulmonary disease, unspecified: Secondary | ICD-10-CM | POA: Diagnosis not present

## 2017-05-26 DIAGNOSIS — R1312 Dysphagia, oropharyngeal phase: Secondary | ICD-10-CM | POA: Diagnosis not present

## 2017-05-27 DIAGNOSIS — R471 Dysarthria and anarthria: Secondary | ICD-10-CM | POA: Diagnosis not present

## 2017-05-27 DIAGNOSIS — R1312 Dysphagia, oropharyngeal phase: Secondary | ICD-10-CM | POA: Diagnosis not present

## 2017-05-27 DIAGNOSIS — R41841 Cognitive communication deficit: Secondary | ICD-10-CM | POA: Diagnosis not present

## 2017-05-27 DIAGNOSIS — J449 Chronic obstructive pulmonary disease, unspecified: Secondary | ICD-10-CM | POA: Diagnosis not present

## 2017-05-28 DIAGNOSIS — R471 Dysarthria and anarthria: Secondary | ICD-10-CM | POA: Diagnosis not present

## 2017-05-28 DIAGNOSIS — J449 Chronic obstructive pulmonary disease, unspecified: Secondary | ICD-10-CM | POA: Diagnosis not present

## 2017-05-28 DIAGNOSIS — R41841 Cognitive communication deficit: Secondary | ICD-10-CM | POA: Diagnosis not present

## 2017-05-28 DIAGNOSIS — R1312 Dysphagia, oropharyngeal phase: Secondary | ICD-10-CM | POA: Diagnosis not present

## 2017-05-30 DIAGNOSIS — R1312 Dysphagia, oropharyngeal phase: Secondary | ICD-10-CM | POA: Diagnosis not present

## 2017-05-30 DIAGNOSIS — R41841 Cognitive communication deficit: Secondary | ICD-10-CM | POA: Diagnosis not present

## 2017-05-30 DIAGNOSIS — J449 Chronic obstructive pulmonary disease, unspecified: Secondary | ICD-10-CM | POA: Diagnosis not present

## 2017-05-30 DIAGNOSIS — R471 Dysarthria and anarthria: Secondary | ICD-10-CM | POA: Diagnosis not present

## 2017-06-01 DIAGNOSIS — R41841 Cognitive communication deficit: Secondary | ICD-10-CM | POA: Diagnosis not present

## 2017-06-01 DIAGNOSIS — R1312 Dysphagia, oropharyngeal phase: Secondary | ICD-10-CM | POA: Diagnosis not present

## 2017-06-01 DIAGNOSIS — R471 Dysarthria and anarthria: Secondary | ICD-10-CM | POA: Diagnosis not present

## 2017-06-01 DIAGNOSIS — J449 Chronic obstructive pulmonary disease, unspecified: Secondary | ICD-10-CM | POA: Diagnosis not present

## 2017-06-04 DIAGNOSIS — J449 Chronic obstructive pulmonary disease, unspecified: Secondary | ICD-10-CM | POA: Diagnosis not present

## 2017-06-04 DIAGNOSIS — R471 Dysarthria and anarthria: Secondary | ICD-10-CM | POA: Diagnosis not present

## 2017-06-04 DIAGNOSIS — R41841 Cognitive communication deficit: Secondary | ICD-10-CM | POA: Diagnosis not present

## 2017-06-04 DIAGNOSIS — R1312 Dysphagia, oropharyngeal phase: Secondary | ICD-10-CM | POA: Diagnosis not present

## 2017-06-05 DIAGNOSIS — R1312 Dysphagia, oropharyngeal phase: Secondary | ICD-10-CM | POA: Diagnosis not present

## 2017-06-05 DIAGNOSIS — R471 Dysarthria and anarthria: Secondary | ICD-10-CM | POA: Diagnosis not present

## 2017-06-05 DIAGNOSIS — R41841 Cognitive communication deficit: Secondary | ICD-10-CM | POA: Diagnosis not present

## 2017-06-05 DIAGNOSIS — J449 Chronic obstructive pulmonary disease, unspecified: Secondary | ICD-10-CM | POA: Diagnosis not present

## 2017-06-09 DIAGNOSIS — R1312 Dysphagia, oropharyngeal phase: Secondary | ICD-10-CM | POA: Diagnosis not present

## 2017-06-09 DIAGNOSIS — R471 Dysarthria and anarthria: Secondary | ICD-10-CM | POA: Diagnosis not present

## 2017-06-09 DIAGNOSIS — R41841 Cognitive communication deficit: Secondary | ICD-10-CM | POA: Diagnosis not present

## 2017-06-09 DIAGNOSIS — J449 Chronic obstructive pulmonary disease, unspecified: Secondary | ICD-10-CM | POA: Diagnosis not present

## 2017-06-11 DIAGNOSIS — R41841 Cognitive communication deficit: Secondary | ICD-10-CM | POA: Diagnosis not present

## 2017-06-11 DIAGNOSIS — R471 Dysarthria and anarthria: Secondary | ICD-10-CM | POA: Diagnosis not present

## 2017-06-11 DIAGNOSIS — J449 Chronic obstructive pulmonary disease, unspecified: Secondary | ICD-10-CM | POA: Diagnosis not present

## 2017-06-11 DIAGNOSIS — R1312 Dysphagia, oropharyngeal phase: Secondary | ICD-10-CM | POA: Diagnosis not present

## 2017-06-12 DIAGNOSIS — R471 Dysarthria and anarthria: Secondary | ICD-10-CM | POA: Diagnosis not present

## 2017-06-12 DIAGNOSIS — J449 Chronic obstructive pulmonary disease, unspecified: Secondary | ICD-10-CM | POA: Diagnosis not present

## 2017-06-12 DIAGNOSIS — R1312 Dysphagia, oropharyngeal phase: Secondary | ICD-10-CM | POA: Diagnosis not present

## 2017-06-12 DIAGNOSIS — R41841 Cognitive communication deficit: Secondary | ICD-10-CM | POA: Diagnosis not present

## 2017-06-13 DIAGNOSIS — J9811 Atelectasis: Secondary | ICD-10-CM | POA: Diagnosis not present

## 2017-06-13 DIAGNOSIS — R05 Cough: Secondary | ICD-10-CM | POA: Diagnosis not present

## 2017-06-15 DIAGNOSIS — R1312 Dysphagia, oropharyngeal phase: Secondary | ICD-10-CM | POA: Diagnosis not present

## 2017-06-15 DIAGNOSIS — J449 Chronic obstructive pulmonary disease, unspecified: Secondary | ICD-10-CM | POA: Diagnosis not present

## 2017-06-15 DIAGNOSIS — R05 Cough: Secondary | ICD-10-CM | POA: Diagnosis not present

## 2017-06-15 DIAGNOSIS — R41841 Cognitive communication deficit: Secondary | ICD-10-CM | POA: Diagnosis not present

## 2017-06-15 DIAGNOSIS — R471 Dysarthria and anarthria: Secondary | ICD-10-CM | POA: Diagnosis not present

## 2017-06-17 DIAGNOSIS — J449 Chronic obstructive pulmonary disease, unspecified: Secondary | ICD-10-CM | POA: Diagnosis not present

## 2017-06-17 DIAGNOSIS — R41841 Cognitive communication deficit: Secondary | ICD-10-CM | POA: Diagnosis not present

## 2017-06-17 DIAGNOSIS — R1312 Dysphagia, oropharyngeal phase: Secondary | ICD-10-CM | POA: Diagnosis not present

## 2017-06-17 DIAGNOSIS — R471 Dysarthria and anarthria: Secondary | ICD-10-CM | POA: Diagnosis not present

## 2017-06-19 DIAGNOSIS — R1312 Dysphagia, oropharyngeal phase: Secondary | ICD-10-CM | POA: Diagnosis not present

## 2017-06-19 DIAGNOSIS — J449 Chronic obstructive pulmonary disease, unspecified: Secondary | ICD-10-CM | POA: Diagnosis not present

## 2017-06-19 DIAGNOSIS — R41841 Cognitive communication deficit: Secondary | ICD-10-CM | POA: Diagnosis not present

## 2017-06-19 DIAGNOSIS — R471 Dysarthria and anarthria: Secondary | ICD-10-CM | POA: Diagnosis not present

## 2017-06-22 DIAGNOSIS — R471 Dysarthria and anarthria: Secondary | ICD-10-CM | POA: Diagnosis not present

## 2017-06-22 DIAGNOSIS — J449 Chronic obstructive pulmonary disease, unspecified: Secondary | ICD-10-CM | POA: Diagnosis not present

## 2017-06-22 DIAGNOSIS — R41841 Cognitive communication deficit: Secondary | ICD-10-CM | POA: Diagnosis not present

## 2017-06-22 DIAGNOSIS — R1312 Dysphagia, oropharyngeal phase: Secondary | ICD-10-CM | POA: Diagnosis not present

## 2017-06-23 DIAGNOSIS — J189 Pneumonia, unspecified organism: Secondary | ICD-10-CM | POA: Diagnosis not present

## 2017-06-23 DIAGNOSIS — D649 Anemia, unspecified: Secondary | ICD-10-CM | POA: Diagnosis not present

## 2017-06-23 DIAGNOSIS — M2550 Pain in unspecified joint: Secondary | ICD-10-CM | POA: Diagnosis not present

## 2017-06-23 DIAGNOSIS — E119 Type 2 diabetes mellitus without complications: Secondary | ICD-10-CM | POA: Diagnosis not present

## 2017-06-23 DIAGNOSIS — J9811 Atelectasis: Secondary | ICD-10-CM | POA: Diagnosis not present

## 2017-06-24 DIAGNOSIS — D649 Anemia, unspecified: Secondary | ICD-10-CM | POA: Diagnosis not present

## 2017-06-25 DIAGNOSIS — R471 Dysarthria and anarthria: Secondary | ICD-10-CM | POA: Diagnosis not present

## 2017-06-25 DIAGNOSIS — J449 Chronic obstructive pulmonary disease, unspecified: Secondary | ICD-10-CM | POA: Diagnosis not present

## 2017-06-25 DIAGNOSIS — R1312 Dysphagia, oropharyngeal phase: Secondary | ICD-10-CM | POA: Diagnosis not present

## 2017-06-25 DIAGNOSIS — R41841 Cognitive communication deficit: Secondary | ICD-10-CM | POA: Diagnosis not present

## 2017-06-26 DIAGNOSIS — R471 Dysarthria and anarthria: Secondary | ICD-10-CM | POA: Diagnosis not present

## 2017-06-26 DIAGNOSIS — R1312 Dysphagia, oropharyngeal phase: Secondary | ICD-10-CM | POA: Diagnosis not present

## 2017-06-26 DIAGNOSIS — R41841 Cognitive communication deficit: Secondary | ICD-10-CM | POA: Diagnosis not present

## 2017-06-26 DIAGNOSIS — J449 Chronic obstructive pulmonary disease, unspecified: Secondary | ICD-10-CM | POA: Diagnosis not present

## 2017-06-29 DIAGNOSIS — J449 Chronic obstructive pulmonary disease, unspecified: Secondary | ICD-10-CM | POA: Diagnosis not present

## 2017-06-29 DIAGNOSIS — R1312 Dysphagia, oropharyngeal phase: Secondary | ICD-10-CM | POA: Diagnosis not present

## 2017-06-29 DIAGNOSIS — R41841 Cognitive communication deficit: Secondary | ICD-10-CM | POA: Diagnosis not present

## 2017-06-29 DIAGNOSIS — R471 Dysarthria and anarthria: Secondary | ICD-10-CM | POA: Diagnosis not present

## 2017-07-02 DIAGNOSIS — R41841 Cognitive communication deficit: Secondary | ICD-10-CM | POA: Diagnosis not present

## 2017-07-02 DIAGNOSIS — R471 Dysarthria and anarthria: Secondary | ICD-10-CM | POA: Diagnosis not present

## 2017-07-02 DIAGNOSIS — R1312 Dysphagia, oropharyngeal phase: Secondary | ICD-10-CM | POA: Diagnosis not present

## 2017-07-02 DIAGNOSIS — J449 Chronic obstructive pulmonary disease, unspecified: Secondary | ICD-10-CM | POA: Diagnosis not present

## 2017-07-03 DIAGNOSIS — J449 Chronic obstructive pulmonary disease, unspecified: Secondary | ICD-10-CM | POA: Diagnosis not present

## 2017-07-03 DIAGNOSIS — R471 Dysarthria and anarthria: Secondary | ICD-10-CM | POA: Diagnosis not present

## 2017-07-03 DIAGNOSIS — R1312 Dysphagia, oropharyngeal phase: Secondary | ICD-10-CM | POA: Diagnosis not present

## 2017-07-03 DIAGNOSIS — R41841 Cognitive communication deficit: Secondary | ICD-10-CM | POA: Diagnosis not present

## 2017-07-06 DIAGNOSIS — F329 Major depressive disorder, single episode, unspecified: Secondary | ICD-10-CM | POA: Diagnosis not present

## 2017-07-08 DIAGNOSIS — R471 Dysarthria and anarthria: Secondary | ICD-10-CM | POA: Diagnosis not present

## 2017-07-08 DIAGNOSIS — R1312 Dysphagia, oropharyngeal phase: Secondary | ICD-10-CM | POA: Diagnosis not present

## 2017-07-08 DIAGNOSIS — J449 Chronic obstructive pulmonary disease, unspecified: Secondary | ICD-10-CM | POA: Diagnosis not present

## 2017-07-08 DIAGNOSIS — R41841 Cognitive communication deficit: Secondary | ICD-10-CM | POA: Diagnosis not present

## 2017-07-09 DIAGNOSIS — R1312 Dysphagia, oropharyngeal phase: Secondary | ICD-10-CM | POA: Diagnosis not present

## 2017-07-09 DIAGNOSIS — J449 Chronic obstructive pulmonary disease, unspecified: Secondary | ICD-10-CM | POA: Diagnosis not present

## 2017-07-09 DIAGNOSIS — R41841 Cognitive communication deficit: Secondary | ICD-10-CM | POA: Diagnosis not present

## 2017-07-09 DIAGNOSIS — R471 Dysarthria and anarthria: Secondary | ICD-10-CM | POA: Diagnosis not present

## 2017-07-10 DIAGNOSIS — J449 Chronic obstructive pulmonary disease, unspecified: Secondary | ICD-10-CM | POA: Diagnosis not present

## 2017-07-10 DIAGNOSIS — R1312 Dysphagia, oropharyngeal phase: Secondary | ICD-10-CM | POA: Diagnosis not present

## 2017-07-10 DIAGNOSIS — R471 Dysarthria and anarthria: Secondary | ICD-10-CM | POA: Diagnosis not present

## 2017-07-10 DIAGNOSIS — R41841 Cognitive communication deficit: Secondary | ICD-10-CM | POA: Diagnosis not present

## 2017-07-12 ENCOUNTER — Ambulatory Visit (INDEPENDENT_AMBULATORY_CARE_PROVIDER_SITE_OTHER): Payer: Medicare Other | Admitting: Urology

## 2017-07-12 DIAGNOSIS — N401 Enlarged prostate with lower urinary tract symptoms: Secondary | ICD-10-CM

## 2017-07-12 DIAGNOSIS — R3911 Hesitancy of micturition: Secondary | ICD-10-CM | POA: Diagnosis not present

## 2017-07-18 DIAGNOSIS — J449 Chronic obstructive pulmonary disease, unspecified: Secondary | ICD-10-CM | POA: Diagnosis not present

## 2017-07-18 DIAGNOSIS — I4891 Unspecified atrial fibrillation: Secondary | ICD-10-CM | POA: Diagnosis not present

## 2017-07-18 DIAGNOSIS — I10 Essential (primary) hypertension: Secondary | ICD-10-CM | POA: Diagnosis not present

## 2017-07-25 DIAGNOSIS — R000 Tachycardia, unspecified: Secondary | ICD-10-CM | POA: Diagnosis not present

## 2017-08-01 DIAGNOSIS — E1151 Type 2 diabetes mellitus with diabetic peripheral angiopathy without gangrene: Secondary | ICD-10-CM | POA: Diagnosis not present

## 2017-08-01 DIAGNOSIS — M79675 Pain in left toe(s): Secondary | ICD-10-CM | POA: Diagnosis not present

## 2017-08-01 DIAGNOSIS — M79674 Pain in right toe(s): Secondary | ICD-10-CM | POA: Diagnosis not present

## 2017-08-01 DIAGNOSIS — Z993 Dependence on wheelchair: Secondary | ICD-10-CM | POA: Diagnosis not present

## 2017-08-01 DIAGNOSIS — B351 Tinea unguium: Secondary | ICD-10-CM | POA: Diagnosis not present

## 2017-08-16 DIAGNOSIS — I2510 Atherosclerotic heart disease of native coronary artery without angina pectoris: Secondary | ICD-10-CM | POA: Diagnosis not present

## 2017-08-16 DIAGNOSIS — M940 Chondrocostal junction syndrome [Tietze]: Secondary | ICD-10-CM | POA: Diagnosis not present

## 2017-08-27 ENCOUNTER — Inpatient Hospital Stay (HOSPITAL_COMMUNITY)
Admission: EM | Admit: 2017-08-27 | Discharge: 2017-08-31 | DRG: 871 | Disposition: A | Discharge status: Hospice | Payer: Medicare Other | Source: Skilled Nursing Facility | Attending: Internal Medicine | Admitting: Internal Medicine

## 2017-08-27 ENCOUNTER — Other Ambulatory Visit: Payer: Self-pay

## 2017-08-27 ENCOUNTER — Encounter (HOSPITAL_COMMUNITY): Payer: Self-pay | Admitting: Emergency Medicine

## 2017-08-27 ENCOUNTER — Emergency Department (HOSPITAL_COMMUNITY): Payer: Medicare Other

## 2017-08-27 DIAGNOSIS — Z66 Do not resuscitate: Secondary | ICD-10-CM | POA: Diagnosis present

## 2017-08-27 DIAGNOSIS — R0602 Shortness of breath: Secondary | ICD-10-CM | POA: Diagnosis not present

## 2017-08-27 DIAGNOSIS — R404 Transient alteration of awareness: Secondary | ICD-10-CM | POA: Diagnosis not present

## 2017-08-27 DIAGNOSIS — I2510 Atherosclerotic heart disease of native coronary artery without angina pectoris: Secondary | ICD-10-CM | POA: Diagnosis present

## 2017-08-27 DIAGNOSIS — Z7984 Long term (current) use of oral hypoglycemic drugs: Secondary | ICD-10-CM

## 2017-08-27 DIAGNOSIS — I69354 Hemiplegia and hemiparesis following cerebral infarction affecting left non-dominant side: Secondary | ICD-10-CM

## 2017-08-27 DIAGNOSIS — R34 Anuria and oliguria: Secondary | ICD-10-CM | POA: Diagnosis not present

## 2017-08-27 DIAGNOSIS — Z7189 Other specified counseling: Secondary | ICD-10-CM

## 2017-08-27 DIAGNOSIS — E039 Hypothyroidism, unspecified: Secondary | ICD-10-CM | POA: Diagnosis present

## 2017-08-27 DIAGNOSIS — Z955 Presence of coronary angioplasty implant and graft: Secondary | ICD-10-CM

## 2017-08-27 DIAGNOSIS — J440 Chronic obstructive pulmonary disease with acute lower respiratory infection: Secondary | ICD-10-CM | POA: Diagnosis not present

## 2017-08-27 DIAGNOSIS — R079 Chest pain, unspecified: Secondary | ICD-10-CM | POA: Diagnosis not present

## 2017-08-27 DIAGNOSIS — R279 Unspecified lack of coordination: Secondary | ICD-10-CM | POA: Diagnosis not present

## 2017-08-27 DIAGNOSIS — R05 Cough: Secondary | ICD-10-CM | POA: Diagnosis not present

## 2017-08-27 DIAGNOSIS — J181 Lobar pneumonia, unspecified organism: Secondary | ICD-10-CM

## 2017-08-27 DIAGNOSIS — A419 Sepsis, unspecified organism: Secondary | ICD-10-CM | POA: Diagnosis not present

## 2017-08-27 DIAGNOSIS — I4891 Unspecified atrial fibrillation: Secondary | ICD-10-CM | POA: Diagnosis present

## 2017-08-27 DIAGNOSIS — Z9081 Acquired absence of spleen: Secondary | ICD-10-CM | POA: Diagnosis not present

## 2017-08-27 DIAGNOSIS — Z85820 Personal history of malignant melanoma of skin: Secondary | ICD-10-CM

## 2017-08-27 DIAGNOSIS — G9341 Metabolic encephalopathy: Secondary | ICD-10-CM | POA: Diagnosis present

## 2017-08-27 DIAGNOSIS — E118 Type 2 diabetes mellitus with unspecified complications: Secondary | ICD-10-CM

## 2017-08-27 DIAGNOSIS — Z7401 Bed confinement status: Secondary | ICD-10-CM | POA: Diagnosis not present

## 2017-08-27 DIAGNOSIS — Y95 Nosocomial condition: Secondary | ICD-10-CM | POA: Diagnosis present

## 2017-08-27 DIAGNOSIS — J449 Chronic obstructive pulmonary disease, unspecified: Secondary | ICD-10-CM | POA: Diagnosis not present

## 2017-08-27 DIAGNOSIS — J690 Pneumonitis due to inhalation of food and vomit: Secondary | ICD-10-CM | POA: Diagnosis present

## 2017-08-27 DIAGNOSIS — F0390 Unspecified dementia without behavioral disturbance: Secondary | ICD-10-CM | POA: Diagnosis present

## 2017-08-27 DIAGNOSIS — J189 Pneumonia, unspecified organism: Secondary | ICD-10-CM | POA: Diagnosis present

## 2017-08-27 DIAGNOSIS — R1310 Dysphagia, unspecified: Secondary | ICD-10-CM | POA: Diagnosis present

## 2017-08-27 DIAGNOSIS — Z515 Encounter for palliative care: Secondary | ICD-10-CM | POA: Diagnosis not present

## 2017-08-27 DIAGNOSIS — R740 Nonspecific elevation of levels of transaminase and lactic acid dehydrogenase [LDH]: Secondary | ICD-10-CM | POA: Diagnosis not present

## 2017-08-27 DIAGNOSIS — J9601 Acute respiratory failure with hypoxia: Secondary | ICD-10-CM | POA: Diagnosis present

## 2017-08-27 DIAGNOSIS — G8194 Hemiplegia, unspecified affecting left nondominant side: Secondary | ICD-10-CM | POA: Diagnosis not present

## 2017-08-27 DIAGNOSIS — Z79899 Other long term (current) drug therapy: Secondary | ICD-10-CM

## 2017-08-27 DIAGNOSIS — I252 Old myocardial infarction: Secondary | ICD-10-CM

## 2017-08-27 DIAGNOSIS — K219 Gastro-esophageal reflux disease without esophagitis: Secondary | ICD-10-CM | POA: Diagnosis not present

## 2017-08-27 DIAGNOSIS — I10 Essential (primary) hypertension: Secondary | ICD-10-CM | POA: Diagnosis present

## 2017-08-27 DIAGNOSIS — R6520 Severe sepsis without septic shock: Secondary | ICD-10-CM | POA: Diagnosis not present

## 2017-08-27 DIAGNOSIS — I5189 Other ill-defined heart diseases: Secondary | ICD-10-CM | POA: Diagnosis not present

## 2017-08-27 DIAGNOSIS — Z7901 Long term (current) use of anticoagulants: Secondary | ICD-10-CM | POA: Diagnosis not present

## 2017-08-27 DIAGNOSIS — I255 Ischemic cardiomyopathy: Secondary | ICD-10-CM | POA: Diagnosis present

## 2017-08-27 DIAGNOSIS — E119 Type 2 diabetes mellitus without complications: Secondary | ICD-10-CM | POA: Diagnosis present

## 2017-08-27 DIAGNOSIS — I351 Nonrheumatic aortic (valve) insufficiency: Secondary | ICD-10-CM | POA: Diagnosis not present

## 2017-08-27 DIAGNOSIS — Z79890 Hormone replacement therapy (postmenopausal): Secondary | ICD-10-CM

## 2017-08-27 DIAGNOSIS — I25119 Atherosclerotic heart disease of native coronary artery with unspecified angina pectoris: Secondary | ICD-10-CM | POA: Diagnosis not present

## 2017-08-27 DIAGNOSIS — I482 Chronic atrial fibrillation: Secondary | ICD-10-CM | POA: Diagnosis present

## 2017-08-27 DIAGNOSIS — E079 Disorder of thyroid, unspecified: Secondary | ICD-10-CM | POA: Diagnosis present

## 2017-08-27 DIAGNOSIS — L8990 Pressure ulcer of unspecified site, unspecified stage: Secondary | ICD-10-CM | POA: Diagnosis present

## 2017-08-27 DIAGNOSIS — Z87891 Personal history of nicotine dependence: Secondary | ICD-10-CM

## 2017-08-27 DIAGNOSIS — Z8614 Personal history of Methicillin resistant Staphylococcus aureus infection: Secondary | ICD-10-CM

## 2017-08-27 DIAGNOSIS — I69391 Dysphagia following cerebral infarction: Secondary | ICD-10-CM | POA: Diagnosis not present

## 2017-08-27 DIAGNOSIS — R402441 Other coma, without documented Glasgow coma scale score, or with partial score reported, in the field [EMT or ambulance]: Secondary | ICD-10-CM | POA: Diagnosis not present

## 2017-08-27 DIAGNOSIS — Z8249 Family history of ischemic heart disease and other diseases of the circulatory system: Secondary | ICD-10-CM

## 2017-08-27 DIAGNOSIS — R609 Edema, unspecified: Secondary | ICD-10-CM | POA: Diagnosis not present

## 2017-08-27 HISTORY — DX: Disorder of thyroid, unspecified: E07.9

## 2017-08-27 HISTORY — DX: Dysphagia, unspecified: R13.10

## 2017-08-27 HISTORY — DX: Hemiplegia, unspecified affecting unspecified side: G81.90

## 2017-08-27 HISTORY — DX: Personal history of malignant melanoma of skin: Z85.820

## 2017-08-27 LAB — COMPREHENSIVE METABOLIC PANEL
ALK PHOS: 88 U/L (ref 38–126)
ALT: 25 U/L (ref 17–63)
ANION GAP: 10 (ref 5–15)
AST: 57 U/L — ABNORMAL HIGH (ref 15–41)
Albumin: 2.8 g/dL — ABNORMAL LOW (ref 3.5–5.0)
BUN: 21 mg/dL — AB (ref 6–20)
CO2: 32 mmol/L (ref 22–32)
Calcium: 9.8 mg/dL (ref 8.9–10.3)
Chloride: 100 mmol/L — ABNORMAL LOW (ref 101–111)
Creatinine, Ser: 1.57 mg/dL — ABNORMAL HIGH (ref 0.61–1.24)
GFR calc Af Amer: 46 mL/min — ABNORMAL LOW (ref >60)
GFR calc non Af Amer: 40 mL/min — ABNORMAL LOW (ref >60)
GLUCOSE: 161 mg/dL — AB (ref 65–99)
POTASSIUM: 5.5 mmol/L — AB (ref 3.5–5.1)
Sodium: 142 mmol/L (ref 135–145)
Total Bilirubin: 1 mg/dL (ref 0.3–1.2)
Total Protein: 7.7 g/dL (ref 6.5–8.1)

## 2017-08-27 LAB — CBC WITH DIFFERENTIAL/PLATELET
Basophils Absolute: 0 10*3/uL (ref 0.0–0.1)
Basophils Relative: 0 %
EOS PCT: 0 %
Eosinophils Absolute: 0 10*3/uL (ref 0.0–0.7)
HEMATOCRIT: 46.5 % (ref 39.0–52.0)
Hemoglobin: 13.9 g/dL (ref 13.0–17.0)
LYMPHS ABS: 1.2 10*3/uL (ref 0.7–4.0)
LYMPHS PCT: 12 %
MCH: 29.8 pg (ref 26.0–34.0)
MCHC: 29.9 g/dL — AB (ref 30.0–36.0)
MCV: 99.8 fL (ref 78.0–100.0)
MONO ABS: 1.3 10*3/uL — AB (ref 0.1–1.0)
MONOS PCT: 13 %
NEUTROS ABS: 7.6 10*3/uL (ref 1.7–7.7)
Neutrophils Relative %: 75 %
PLATELETS: 382 10*3/uL (ref 150–400)
RBC: 4.66 MIL/uL (ref 4.22–5.81)
RDW: 14.8 % (ref 11.5–15.5)
WBC: 10.2 10*3/uL (ref 4.0–10.5)

## 2017-08-27 LAB — URINALYSIS, ROUTINE W REFLEX MICROSCOPIC
BACTERIA UA: NONE SEEN
GLUCOSE, UA: NEGATIVE mg/dL
Ketones, ur: NEGATIVE mg/dL
LEUKOCYTES UA: NEGATIVE
NITRITE: NEGATIVE
PROTEIN: 100 mg/dL — AB
Specific Gravity, Urine: 1.027 (ref 1.005–1.030)
pH: 5 (ref 5.0–8.0)

## 2017-08-27 LAB — TROPONIN I

## 2017-08-27 LAB — GLUCOSE, CAPILLARY: GLUCOSE-CAPILLARY: 130 mg/dL — AB (ref 65–99)

## 2017-08-27 LAB — PROCALCITONIN: Procalcitonin: 0.23 ng/mL

## 2017-08-27 LAB — LACTIC ACID, PLASMA: Lactic Acid, Venous: 2.4 mmol/L (ref 0.5–1.9)

## 2017-08-27 LAB — I-STAT CG4 LACTIC ACID, ED: LACTIC ACID, VENOUS: 1.72 mmol/L (ref 0.5–1.9)

## 2017-08-27 MED ORDER — PIPERACILLIN-TAZOBACTAM 3.375 G IVPB
3.3750 g | Freq: Three times a day (TID) | INTRAVENOUS | Status: DC
  Administered 2017-08-28 – 2017-08-30 (×8): 3.375 g via INTRAVENOUS
  Filled 2017-08-27 (×8): qty 50

## 2017-08-27 MED ORDER — INSULIN ASPART 100 UNIT/ML ~~LOC~~ SOLN
0.0000 [IU] | Freq: Three times a day (TID) | SUBCUTANEOUS | Status: DC
  Administered 2017-08-29: 2 [IU] via SUBCUTANEOUS

## 2017-08-27 MED ORDER — SODIUM CHLORIDE 0.9 % IV SOLN
2.0000 g | Freq: Once | INTRAVENOUS | Status: AC
  Administered 2017-08-27: 2 g via INTRAVENOUS
  Filled 2017-08-27: qty 2

## 2017-08-27 MED ORDER — IPRATROPIUM-ALBUTEROL 0.5-2.5 (3) MG/3ML IN SOLN
3.0000 mL | Freq: Three times a day (TID) | RESPIRATORY_TRACT | Status: DC
  Administered 2017-08-28 – 2017-08-30 (×9): 3 mL via RESPIRATORY_TRACT
  Filled 2017-08-27 (×9): qty 3

## 2017-08-27 MED ORDER — LORAZEPAM 2 MG/ML IJ SOLN
0.5000 mg | Freq: Four times a day (QID) | INTRAMUSCULAR | Status: DC | PRN
  Administered 2017-08-27 – 2017-08-30 (×3): 0.5 mg via INTRAVENOUS
  Filled 2017-08-27 (×2): qty 1

## 2017-08-27 MED ORDER — VANCOMYCIN HCL IN DEXTROSE 1-5 GM/200ML-% IV SOLN
1000.0000 mg | Freq: Once | INTRAVENOUS | Status: DC
  Filled 2017-08-27: qty 200

## 2017-08-27 MED ORDER — LORAZEPAM 2 MG/ML IJ SOLN
INTRAMUSCULAR | Status: AC
  Administered 2017-08-28: 0.5 mg via INTRAVENOUS
  Filled 2017-08-27: qty 1

## 2017-08-27 MED ORDER — METOPROLOL TARTRATE 5 MG/5ML IV SOLN
5.0000 mg | Freq: Once | INTRAVENOUS | Status: AC
  Administered 2017-08-27: 5 mg via INTRAVENOUS
  Filled 2017-08-27: qty 5

## 2017-08-27 MED ORDER — VANCOMYCIN HCL 10 G IV SOLR
1250.0000 mg | INTRAVENOUS | Status: DC
  Administered 2017-08-28 – 2017-08-29 (×2): 1250 mg via INTRAVENOUS
  Filled 2017-08-27 (×3): qty 1250

## 2017-08-27 MED ORDER — METOPROLOL TARTRATE 5 MG/5ML IV SOLN
10.0000 mg | Freq: Four times a day (QID) | INTRAVENOUS | Status: DC
  Administered 2017-08-27: 10 mg via INTRAVENOUS
  Filled 2017-08-27: qty 10

## 2017-08-27 MED ORDER — SODIUM CHLORIDE 0.9 % IV SOLN
1500.0000 mg | Freq: Once | INTRAVENOUS | Status: AC
Start: 2017-08-27 — End: 2017-08-27
  Administered 2017-08-27: 1500 mg via INTRAVENOUS
  Filled 2017-08-27: qty 1500

## 2017-08-27 MED ORDER — SODIUM CHLORIDE 0.9 % IV BOLUS
1000.0000 mL | Freq: Once | INTRAVENOUS | Status: AC
  Administered 2017-08-27: 1000 mL via INTRAVENOUS

## 2017-08-27 MED ORDER — SODIUM CHLORIDE 0.9 % IV SOLN
INTRAVENOUS | Status: DC
  Administered 2017-08-27 – 2017-08-30 (×6): via INTRAVENOUS

## 2017-08-27 MED ORDER — SODIUM CHLORIDE 0.9 % IV BOLUS
700.0000 mL | Freq: Once | INTRAVENOUS | Status: AC
  Administered 2017-08-27: 700 mL via INTRAVENOUS

## 2017-08-27 MED ORDER — DOFETILIDE 125 MCG PO CAPS
250.0000 ug | ORAL_CAPSULE | Freq: Two times a day (BID) | ORAL | Status: DC
  Administered 2017-08-30: 250 ug via ORAL
  Filled 2017-08-27 (×3): qty 1
  Filled 2017-08-27: qty 2
  Filled 2017-08-27: qty 1
  Filled 2017-08-27 (×3): qty 2

## 2017-08-27 MED ORDER — APIXABAN 5 MG PO TABS: 5.0000 mg | ORAL_TABLET | Freq: Two times a day (BID) | ORAL | Status: DC

## 2017-08-27 MED ORDER — VANCOMYCIN HCL 500 MG IV SOLR
INTRAVENOUS | Status: AC
  Filled 2017-08-27: qty 500

## 2017-08-27 MED ORDER — LEVOTHYROXINE SODIUM 100 MCG IV SOLR
75.0000 ug | Freq: Every day | INTRAVENOUS | Status: DC
  Administered 2017-08-28 – 2017-08-30 (×3): 75 ug via INTRAVENOUS
  Filled 2017-08-27 (×3): qty 5

## 2017-08-27 MED ORDER — FAMOTIDINE IN NACL 20-0.9 MG/50ML-% IV SOLN
20.0000 mg | Freq: Two times a day (BID) | INTRAVENOUS | Status: DC
  Administered 2017-08-27 – 2017-08-30 (×6): 20 mg via INTRAVENOUS
  Filled 2017-08-27 (×6): qty 50

## 2017-08-27 MED ORDER — INSULIN ASPART 100 UNIT/ML ~~LOC~~ SOLN: 0.0000 [IU] | Freq: Four times a day (QID) | SUBCUTANEOUS | Status: DC

## 2017-08-27 NOTE — ED Notes (Signed)
Verified with daughter at bedside pt is a DNR.  Copy at bedside.

## 2017-08-27 NOTE — Progress Notes (Signed)
Pharmacy Antibiotic Note  Alejandro Diaz is a 83 y.o. male admitted on 08/27/2017 with pneumonia/sepsis.  Pharmacy has been consulted for Vancomycin and zosyn dosing.  Plan: Vancomycin 1500mg  iv loading dose, then 1250mg  IV every 24 hours.  Goal trough 15-20 mcg/mL. Zosyn 3.375g IV q8h (4 hour infusion).  F/u cxs and clinical progress Monitor V/S, labs, and levels as indicated  Height: 6' (182.9 cm) Weight: 194 lb (88 kg) IBW/kg (Calculated) : 77.6  Temp (24hrs), Avg:99.6 F (37.6 C), Min:99.3 F (37.4 C), Max:100.1 F (37.8 C)  Recent Labs  Lab 08/27/17 1838  WBC 10.2  CREATININE 1.57*  LATICACIDVEN 1.72    Estimated Creatinine Clearance: 40.5 mL/min (A) (by C-G formula based on SCr of 1.57 mg/dL (H)).    No Known Allergies  Antimicrobials this admission: Vancomycin 6/8 >> Zosyn 6/8 >>  Cefepime x 1 dose in ED  Dose adjustments this admission: n/a  Microbiology results: 6/8 BCx: pending 6/8 UCx: pending   MRSA PCR:   Thank you for allowing pharmacy to be a part of this patient's care.  Isac Sarna, BS [place], [place] Clinical Pharmacist Pager [phone]  08/27/2017 7:46 PM

## 2017-08-27 NOTE — ED Notes (Signed)
Lab in to draw blood cultures at this time

## 2017-08-27 NOTE — ED Provider Notes (Signed)
[facility] EMERGENCY DEPARTMENT Provider Note   CSN: [id number] Arrival date & time: 08/27/17  1814     History   Chief Complaint Chief Complaint  Patient presents with  . Shortness of Breath    HPI Alejandro Diaz is a 83 y.o. male.  HPI  The patient is an 83 year old male, he has a very complicated medical history including prior stroke leaving him with left-sided hemiplegia, he has a history of coronary disease with stents in 2006 in 2009, he has atrial fibrillation which is chronic and for which she is treated with a novel oral anti-anticoagulant, diabetes and is currently in a nursing facility where he was found to have increasing work of breathing and shortness of breath today.  He was noted to be severely hypothermic with a temperature of 89 F, he had an oxygen which was on measurably low, he was tachypneic and unresponsive when the paramedics found him.  They reported that he was given albuterol, they were able to sit him up and he opened his eyes and started to try to talk however he was confused and distress and continued to drift off and unresponsiveness.  Reportedly the patient has a DO NOT RESUSCITATE order which I found at the bedside.  I discussed this with the family and they are also confirmatory that he does not want to be resuscitated with either invasive ventilation CPR or cardioversion if needed.  He is willing to proceed fluids antibiotics and other supportive care.  The family last saw him approximately 1-1/2 weeks ago when he was his normal self.  He has dementia, he is in a nursing facility where he has been for some time since his stroke 2-1/2 years ago.  The patient is unable to give me any other information, level 5 caveat applies due to altered mental status  Past Medical History:  Diagnosis Date  . Arthritis   . Atrial fibrillation ([place])   . Atypical chest pain 12/02/2015  . CAD (coronary artery disease)    Stents to unknown arteries in 2006,2009  . Cancer  ([place])    melanoma  . COPD (chronic obstructive pulmonary disease) ([place])   . CVA (cerebral infarction) 06/979   Embolic Left sided hemiplegia.   . Diabetes mellitus without complication ([place])   . Dysphagia   . GERD (gastroesophageal reflux disease)   . Goals of care, counseling/discussion 12/02/2015  . Hemiplegia ([place])    left sided  . Hemiplegia affecting left nondominant side ([place]) 12/02/2015  . Hip pain 03/04/2016  . Hypertension   . Ischemic cardiomyopathy    EF of 40-45%  . MRSA bacteremia 12/02/2015  . Personal history of malignant melanoma of skin   . Rash and nonspecific skin eruption 12/02/2015  . Thyroid disease     Patient Active Problem List   Diagnosis Date Noted  . Hip pain 03/04/2016  . MRSA bacteremia 12/02/2015  . Rash and nonspecific skin eruption 12/02/2015  . Hemiplegia affecting left nondominant side ([place]) 12/02/2015  . COPD (chronic obstructive pulmonary disease) ([place]) 12/02/2015  . Diabetes mellitus ([place]) 12/02/2015  . Disease of thyroid gland 12/02/2015  . GERD (gastroesophageal reflux disease) 12/02/2015  . Goals of care, counseling/discussion 12/02/2015  . Atypical chest pain 12/02/2015  . Pain around PEG tube site 03/13/2015  . PEG (percutaneous endoscopic gastrostomy) status ([place]) 03/13/2015  . Anticoagulated 11/21/2014  . Pressure ulcer 09/25/2014  . Chest pain 09/24/2014  . Diabetes ([place]) 09/24/2014  . AF (atrial fibrillation) ([place]) 09/24/2014  .  Essential hypertension 09/24/2014  . Dysarthria due to cerebrovascular accident 04/21/2014  . Coronary artery disease with history of myocardial infarction without history of CABG 03/28/2014  . Abdominal aortic aneurysm without rupture ([place]) 11/14/2013  . Controlled type 2 DM with peripheral circulatory disorder ([place]) 08/02/2013  . Aortic stenosis 07/20/2013  . DM type 2 (diabetes mellitus, type 2) ([place]) 07/20/2013  . CAD (coronary artery disease) 06/20/2013  . Bacterial pneumonia 03/13/2013  . COPD  (chronic obstructive pulmonary disease) with acute bronchitis ([place]) 03/13/2013    Past Surgical History:  Procedure Laterality Date  . BACK SURGERY    . BRAIN SURGERY    . CARDIAC SURGERY    . CORONARY STENT PLACEMENT    . PERCUTANEOUS ENDOSCOPIC GASTROSTOMY (PEG) REMOVAL N/A 03/19/2015   Procedure: PERCUTANEOUS ENDOSCOPIC GASTROSTOMY (PEG) REMOVAL;  Surgeon: Danie Binder, MD;  Location: [facility] ENDO SUITE;  Service: Endoscopy;  Laterality: N/A;  1200  . SPLENECTOMY          Home Medications    Prior to Admission medications   Medication Sig Start Date End Date Taking? Authorizing Provider  albuterol (PROVENTIL HFA;VENTOLIN HFA) 108 (90 BASE) MCG/ACT inhaler Inhale 1-2 puffs into the lungs every 6 (six) hours as needed for wheezing or shortness of breath. 06/16/13  Yes Carmin Muskrat, MD  alum & mag hydroxide-simeth (MAALOX REGULAR STRENGTH) 200-200-20 MG/5ML suspension Take 30 mLs by mouth every 6 (six) hours as needed for heartburn.   Yes [provider]  Amino Acids-Protein Hydrolys (FEEDING SUPPLEMENT, PRO-STAT SUGAR FREE 64,) LIQD Take 30 mLs by mouth 2 (two) times daily.   Yes [provider]  apixaban (ELIQUIS) 5 MG TABS tablet Take 5 mg by mouth 2 (two) times daily.    Yes [provider]  cholecalciferol (VITAMIN D) 1000 UNITS tablet Take 1,000 Units by mouth daily.    Yes [provider]  clotrimazole-betamethasone (LOTRISONE) cream Apply 1 application topically See admin instructions. Applied to face twice daily on MWF   Yes [provider]  dofetilide (TIKOSYN) 250 MCG capsule Take 250 mcg by mouth 2 (two) times daily.   Yes [provider]  famotidine (PEPCID) 20 MG tablet Take 20 mg by mouth 2 (two) times daily.    Yes [provider]  ferrous sulfate 325 (65 FE) MG tablet Take 325 mg by mouth 2 (two) times daily with a meal.   Yes [provider]  HYDROcodone-acetaminophen (NORCO/VICODIN) 5-325 MG per  tablet Take 1 tablet by mouth every 8 (eight) hours as needed for moderate pain.    Yes [provider]  ipratropium-albuterol (DUONEB) 0.5-2.5 (3) MG/3ML SOLN Take 3 mLs by nebulization 3 (three) times daily.   Yes [provider]  isosorbide mononitrate (IMDUR) 15 mg TB24 24 hr tablet Take 15 mg by mouth daily.   Yes [provider]  levothyroxine (SYNTHROID, LEVOTHROID) 137 MCG tablet Take 137 mcg by mouth every evening.    Yes [provider]  magnesium hydroxide (MILK OF MAGNESIA) 400 MG/5ML suspension Take 30 mLs by mouth daily as needed for mild constipation.   Yes [provider]  Melatonin 3 MG TABS Take 3 mg by mouth at bedtime.   Yes [provider]  metoprolol (LOPRESSOR) 100 MG tablet Take 100 mg by mouth 2 (two) times daily.    Yes [provider]  OXYGEN Inhale 2 L into the lungs daily as needed (for shortness of breath).    Yes [provider]  PARoxetine (PAXIL) 40 MG tablet Take 40 mg by mouth every evening.   Yes [provider]  phenol (CHLORASEPTIC) 1.4 % LIQD Use as directed 2 sprays in the mouth or throat every 4 (four) hours as needed for throat irritation / pain.   Yes [provider]  polyethylene glycol powder (MIRALAX) powder Take 17 g by mouth daily.    Yes [provider]  pravastatin (PRAVACHOL) 40 MG tablet Take 40 mg by mouth at bedtime.    Yes [provider]  sitaGLIPtin (JANUVIA) 50 MG tablet Take 50 mg by mouth daily.   Yes [provider]  traZODone (DESYREL) 50 MG tablet Take 50 mg by mouth at bedtime.    Yes [provider]    Family History Family History  Problem Relation Age of Onset  . Heart attack Father     Social History Social History   Tobacco Use  . Smoking status: Former Smoker    Last attempt to quit: 03/22/2010    Years since quitting: 7.4  . Smokeless tobacco: Never Used  Substance Use Topics  . Alcohol use: No     Alcohol/week: 0.0 oz  . Drug use: No     Allergies   Patient has no known allergies.   Review of Systems Review of Systems  Unable to perform ROS: Acuity of condition     Physical Exam Updated Vital Signs BP 106/87   Pulse 77   Temp 99.3 F (37.4 C)   Resp (!) 31   Ht 6' (1.829 m)   Wt 88 kg (194 lb)   SpO2 (!) 87%   BMI 26.31 kg/m   Physical Exam  Constitutional:  The patient is toxic appearing, in respiratory distress, as white as a sheet  HENT:  Oropharynx has some thick mucus, no signs of redness  Eyes:  Pale conjunctive, pupils are equal round and reactive, when he opens his eyes he is able to look from the left to the right and back  Neck:  No lymphadenopathy, neck is supple  Cardiovascular:  The patient is tachycardic, in atrial fibrillation with an irregular rate, he is tachycardic to 120 to 140 bpm with weak peripheral pulses  Pulmonary/Chest:  The patient is tachypneic, taking shallow breaths, he has diffuse rhonchi and rales, he is not wheezing, accessory muscles are being used  Abdominal:  The abdomen is soft, it is not distended, he does have some peripheral edema and anasarca  Musculoskeletal:  Peripheral edema present, no obvious deformities  Neurological:  The patient is unable to answer my questions, he is able to open his eyes to command, he does raise his arms to command, he tries to talk occasional sentences but is extremely weak and short of breath limiting what he can say.  Skin:  No definite rashes, he does have severe diffuse pale appearing skin     ED Treatments / Results  Labs (all labs ordered are listed, but only abnormal results are displayed) Labs Reviewed  COMPREHENSIVE METABOLIC PANEL - Abnormal; Notable for the following components:      Result Value   Potassium 5.5 (*)    Chloride 100 (*)    Glucose, Bld 161 (*)    BUN 21 (*)    Creatinine, Ser 1.57 (*)    Albumin 2.8 (*)    AST 57 (*)    GFR calc non Af Amer 40  (*)    GFR calc Af Amer 46 (*)    All  other components within normal limits  CBC WITH DIFFERENTIAL/PLATELET - Abnormal; Notable for the following components:   MCHC 29.9 (*)    Monocytes Absolute 1.3 (*)    All other components within normal limits  CULTURE, BLOOD (ROUTINE X 2)  CULTURE, BLOOD (ROUTINE X 2)  URINE CULTURE  TROPONIN I  URINALYSIS, ROUTINE W REFLEX MICROSCOPIC  I-STAT CG4 LACTIC ACID, ED    EKG EKG Interpretation  Date/Time:  Saturday August 27 2017 18:33:29 EDT Ventricular Rate:  124 PR Interval:    QRS Duration: 97 QT Interval:  339 QTC Calculation: 487 R Axis:   -44 Text Interpretation:  Atrial fibrillation Left axis deviation Anterior infarct, old Since last tracing Atrial fibrillation now preasent no ST elevation. Confirmed by Noemi Chapel [phone]) on 08/27/2017 6:38:22 PM   Radiology Dg Chest Port 1 View  Result Date: 08/27/2017 CLINICAL DATA:  Cough and shortness of breath. Sepsis. Possible aspiration. EXAM: PORTABLE CHEST 1 VIEW COMPARISON:  09/24/2014 FINDINGS: Mild cardiomegaly and tortuosity of thoracic aorta remains stable. New infiltrate is seen in the right lower lung, suspicious for pneumonia. Mild right pleural thickening versus tiny pleural effusion also noted. IMPRESSION: Right lower lung infiltrate, suspicious for pneumonia. Tiny right pleural effusion versus pleural thickening. Electronically Signed   By: Earle Gell M.D.   On: 08/27/2017 19:02    Procedures .Critical Care Performed by: Noemi Chapel, MD Authorized by: Noemi Chapel, MD   Critical care provider statement:    Critical care time (minutes):  35   Critical care time was exclusive of:  Separately billable procedures and treating other patients and teaching time   Critical care was necessary to treat or prevent imminent or life-threatening deterioration of the following conditions:  Sepsis   Critical care was time spent personally by me on the following activities:  Blood draw for  specimens, development of treatment plan with patient or surrogate, discussions with consultants, evaluation of patient's response to treatment, examination of patient, obtaining history from patient or surrogate, ordering and performing treatments and interventions, ordering and review of laboratory studies, ordering and review of radiographic studies, pulse oximetry, re-evaluation of patient's condition and review of old charts   (including critical care time)  Due to the inability to obtain peripheral IV access I placed an ultrasound-guided IV in the right antecubital fossa  Angiocath insertion Performed by: Johnna Acosta  Consent: Verbal consent obtained. Risks and benefits: risks, benefits and alternatives were discussed Time out: Immediately prior to procedure a "time out" was called to verify the correct patient, procedure, equipment, support staff and site/side marked as required.  Preparation: Patient was prepped and draped in the usual sterile fashion.  Vein Location: R AC  Ultrasound Guided  Gauge: 20  Normal blood return and flush without difficulty Patient tolerance: Patient tolerated the procedure well with no immediate complications.     Medications Ordered in ED Medications  sodium chloride 0.9 % bolus 700 mL (700 mLs Intravenous New Bag/Given 08/27/17 1940)  vancomycin (VANCOCIN) 1,500 mg in sodium chloride 0.9 % 500 mL IVPB (1,500 mg Intravenous New Bag/Given 08/27/17 1939)  ceFEPIme (MAXIPIME) 2 g in sodium chloride 0.9 % 100 mL IVPB (2 g Intravenous New Bag/Given 08/27/17 1859)  sodium chloride 0.9 % bolus 1,000 mL (0 mLs Intravenous Stopped 08/27/17 1939)  sodium chloride 0.9 % bolus 1,000 mL (1,000 mLs Intravenous New Bag/Given 08/27/17 1840)  metoprolol tartrate (LOPRESSOR) injection 5 mg (5 mg Intravenous Given 08/27/17 1945)     Initial Impression /  Assessment and Plan / ED Course  I have reviewed the triage vital signs and the nursing notes.  Pertinent labs &  imaging results that were available during my care of the patient were reviewed by me and considered in my medical decision making (see chart for details).  Clinical Course as of Aug 28 1946  Sat Aug 27, 2017  1909 CXR shows that he has an infiltrate at the lower lung base on the Right side - the WBC is normal and lactic acid is 1.72.  Will be cautions with IVF - though 30 cc / kg were ordered - will taper and follow BP.  Family agreeable to admission - antibiotics given.   [BM]  6063 Metabolic panel shows that the patient has mild hyperkalemia, renal insufficiency with a creatinine of 1.57, troponin is normal.  Hospitalist paged for admission at 7:25 PM   [BM]    Clinical Course User Index [BM] Noemi Chapel, MD   The patient will have sepsis protocol initiated as he is in severe respiratory distress with abnormal lung sounds.  I suspect that he aspirated but this could also be a primary cardiac event as well.  He is currently in rapid A. fib, he will need to be fluid resuscitated, he was reported to be severely hypothermic and will need to be warmed if that is the case however he will need a rectal temperature, broad-spectrum antibiotics will be given, chest x-ray, airway pulmonary toilet as tolerated.  DO NOT RESUSCITATE orders will be respected.  Family updated at the bedside.  Despite his temperature in the field his temperature here was actually 100.1 by rectal route.  He is not perfusing well which is why he feels cold to the touch  Critically ill  D/w Dr. Nehemiah Settle - will admit  Final Clinical Impressions(s) / ED Diagnoses   Final diagnoses:  Sepsis, due to unspecified organism ([place])  HCAP (healthcare-associated pneumonia)      Noemi Chapel, MD 08/27/17 1948

## 2017-08-27 NOTE — ED Notes (Signed)
First set of blood cultures drawn at this time.

## 2017-08-27 NOTE — ED Notes (Signed)
Pedal pulses ascultated on both feet.

## 2017-08-27 NOTE — ED Triage Notes (Signed)
Pt arrived from [facility] for evaluation of shortness of breath.  Pt arrived very cold to touch and pale.  Audible rales and crackles noted.
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
## 2017-08-28 ENCOUNTER — Inpatient Hospital Stay (HOSPITAL_COMMUNITY): Payer: Medicare Other

## 2017-08-28 LAB — GLUCOSE, CAPILLARY
GLUCOSE-CAPILLARY: 108 mg/dL — AB (ref 65–99)
Glucose-Capillary: 116 mg/dL — ABNORMAL HIGH (ref 65–99)
Glucose-Capillary: 103 mg/dL — ABNORMAL HIGH (ref 65–99)
Glucose-Capillary: 113 mg/dL — ABNORMAL HIGH (ref 65–99)

## 2017-08-28 LAB — BASIC METABOLIC PANEL
ANION GAP: 10 (ref 5–15)
BUN: 22 mg/dL — ABNORMAL HIGH (ref 6–20)
CO2: 25 mmol/L (ref 22–32)
Calcium: 8.8 mg/dL — ABNORMAL LOW (ref 8.9–10.3)
Chloride: 109 mmol/L (ref 101–111)
Creatinine, Ser: 1.57 mg/dL — ABNORMAL HIGH (ref 0.61–1.24)
GFR, EST AFRICAN AMERICAN: 46 mL/min — AB (ref >60)
GFR, EST NON AFRICAN AMERICAN: 40 mL/min — AB (ref >60)
Glucose, Bld: 126 mg/dL — ABNORMAL HIGH (ref 65–99)
POTASSIUM: 5.5 mmol/L — AB (ref 3.5–5.1)
SODIUM: 144 mmol/L (ref 135–145)

## 2017-08-28 LAB — LACTIC ACID, PLASMA
Lactic Acid, Venous: 2.9 mmol/L (ref 0.5–1.9)
Lactic Acid, Venous: 2.4 mmol/L (ref 0.5–1.9)
Lactic Acid, Venous: 2.4 mmol/L (ref 0.5–1.9)

## 2017-08-28 LAB — CBC
HEMATOCRIT: 46.8 % (ref 39.0–52.0)
HEMOGLOBIN: 13.3 g/dL (ref 13.0–17.0)
MCH: 29.4 pg (ref 26.0–34.0)
MCHC: 28.4 g/dL — ABNORMAL LOW (ref 30.0–36.0)
MCV: 103.3 fL — ABNORMAL HIGH (ref 78.0–100.0)
Platelets: 310 10*3/uL (ref 150–400)
RBC: 4.53 MIL/uL (ref 4.22–5.81)
RDW: 14.9 % (ref 11.5–15.5)
WBC: 16.4 10*3/uL — ABNORMAL HIGH (ref 4.0–10.5)

## 2017-08-28 LAB — PROCALCITONIN: Procalcitonin: 0.51 ng/mL

## 2017-08-28 LAB — APTT
aPTT: 40 s — ABNORMAL HIGH (ref 24–36)
aPTT: >200 s (ref 24–36)

## 2017-08-28 LAB — HEPARIN LEVEL (UNFRACTIONATED): Heparin Unfractionated: >2.2 IU/mL — ABNORMAL HIGH (ref 0.30–0.70)

## 2017-08-28 LAB — MRSA PCR SCREENING: MRSA BY PCR: POSITIVE — AB

## 2017-08-28 MED ORDER — DILTIAZEM HCL-DEXTROSE 100-5 MG/100ML-% IV SOLN (PREMIX)
5.0000 mg/h | INTRAVENOUS | Status: DC
  Administered 2017-08-28 – 2017-08-29 (×3): 5 mg/h via INTRAVENOUS
  Filled 2017-08-28 (×3): qty 100

## 2017-08-28 MED ORDER — SODIUM CHLORIDE 0.9% FLUSH: 10.0000 mL | INTRAVENOUS | Status: DC | PRN

## 2017-08-28 MED ORDER — MUPIROCIN 2 % EX OINT
1.0000 "application " | TOPICAL_OINTMENT | Freq: Two times a day (BID) | CUTANEOUS | Status: DC
  Administered 2017-08-28 – 2017-08-30 (×5): 1 via NASAL
  Filled 2017-08-28: qty 22

## 2017-08-28 MED ORDER — HEPARIN (PORCINE) IN NACL 100-0.45 UNIT/ML-% IJ SOLN
1100.0000 [IU]/h | INTRAMUSCULAR | Status: DC
  Administered 2017-08-28: 1100 [IU]/h via INTRAVENOUS
  Filled 2017-08-28: qty 250

## 2017-08-28 MED ORDER — CHLORHEXIDINE GLUCONATE CLOTH 2 % EX PADS
6.0000 | MEDICATED_PAD | Freq: Every day | CUTANEOUS | Status: DC
  Administered 2017-08-28 – 2017-08-30 (×3): 6 via TOPICAL

## 2017-08-28 MED ORDER — SODIUM CHLORIDE 0.9% FLUSH
10.0000 mL | Freq: Two times a day (BID) | INTRAVENOUS | Status: DC
  Administered 2017-08-28 – 2017-08-30 (×5): 10 mL

## 2017-08-28 MED ORDER — SODIUM CHLORIDE 0.9 % IV BOLUS
1000.0000 mL | Freq: Once | INTRAVENOUS | Status: AC
  Administered 2017-08-28: 1000 mL via INTRAVENOUS

## 2017-08-28 MED ORDER — ALBUMIN HUMAN 25 % IV SOLN
12.5000 g | Freq: Once | INTRAVENOUS | Status: AC
  Administered 2017-08-28: 12.5 g via INTRAVENOUS
  Filled 2017-08-28: qty 50

## 2017-08-28 MED ORDER — APIXABAN 2.5 MG PO TABS: 2.5000 mg | ORAL_TABLET | Freq: Two times a day (BID) | ORAL | Status: DC

## 2017-08-28 MED ORDER — METOPROLOL TARTRATE 5 MG/5ML IV SOLN
5.0000 mg | INTRAVENOUS | Status: DC
  Administered 2017-08-28 – 2017-08-30 (×18): 5 mg via INTRAVENOUS
  Filled 2017-08-28 (×18): qty 5

## 2017-08-28 NOTE — Progress Notes (Signed)
Night shift SDU coverage note.  The patient was seen due to lactic acidosis and hypotension with oliguria.  Earlier in the shift, I received verbal report from Dr. Nehemiah Settle about the patient's condition at the time of admission. He does not seem to have improved much despite treatment. This was discussed with the family. He has DNR status, but he is currently receiving full care.  Most recent vital signs temperature 99.9 F, pulse 123, respirations 20, blood pressure 96/36 mmHg and O2 sat 100% on nonrebreather mask.   Lungs: RLL rales.  Positive rhonchi and wheezing. Cardiovascular: S1 and S2, tachycardic in the 120s. Abdomen: Soft nontender. Extremities: 1+ positive lower extremities pitting edema  Labs and notes have been reviewed.  Hypotension with oliguria New current treatment for pneumonia. Normal saline 1000 mL over 61 minutes x 1. Albumin 12.5 g IVP x1. Monitor intake and output.  Tennis Must, MD.

## 2017-08-28 NOTE — Progress Notes (Signed)
ANTICOAGULATION CONSULT NOTE - Initial Consult  Pharmacy Consult for Heparin Indication: atrial fibrillation  No Known Allergies  Patient Measurements: Height: 6' (182.9 cm) Weight: 209 lb 14.1 oz (95.2 kg) IBW/kg (Calculated) : 77.6 HEPARIN DW (KG): 88  Vital Signs: Temp: 99.1 F (37.3 C) (06/09 1000) Temp Source: Axillary (06/09 0756) BP: 106/75 (06/09 0900) Pulse Rate: 130 (06/09 1000)  Labs: Recent Labs    08/27/17 1838 08/28/17 0525  HGB 13.9 13.3  HCT 46.5 46.8  PLT 382 310  CREATININE 1.57* 1.57*  TROPONINI <0.03  --     Estimated Creatinine Clearance: 44.2 mL/min (A) (by C-G formula based on SCr of 1.57 mg/dL (H)).   Medical History: Past Medical History:  Diagnosis Date  . Arthritis   . Atrial fibrillation ([place])   . Atypical chest pain 12/02/2015  . CAD (coronary artery disease)    Stents to unknown arteries in 2006,2009  . Cancer ([place])    melanoma  . COPD (chronic obstructive pulmonary disease) ([place])   . CVA (cerebral infarction) 08/4401   Embolic Left sided hemiplegia.   . Diabetes mellitus without complication ([place])   . Dysphagia   . GERD (gastroesophageal reflux disease)   . Goals of care, counseling/discussion 12/02/2015  . Hemiplegia ([place])    left sided  . Hemiplegia affecting left nondominant side ([place]) 12/02/2015  . Hip pain 03/04/2016  . Hypertension   . Ischemic cardiomyopathy    EF of 40-45%  . MRSA bacteremia 12/02/2015  . Personal history of malignant melanoma of skin   . Rash and nonspecific skin eruption 12/02/2015  . Thyroid disease     Medications:  Medications Prior to Admission  Medication Sig Dispense Refill Last Dose  . albuterol (PROVENTIL HFA;VENTOLIN HFA) 108 (90 BASE) MCG/ACT inhaler Inhale 1-2 puffs into the lungs every 6 (six) hours as needed for wheezing or shortness of breath. 1 Inhaler 0 unknown  . alum & mag hydroxide-simeth (MAALOX REGULAR STRENGTH) 200-200-20 MG/5ML suspension Take 30 mLs by mouth every 6 (six)  hours as needed for heartburn.   unknown  . Amino Acids-Protein Hydrolys (FEEDING SUPPLEMENT, PRO-STAT SUGAR FREE 64,) LIQD Take 30 mLs by mouth 2 (two) times daily.   08/27/2017 at Unknown time  . apixaban (ELIQUIS) 5 MG TABS tablet Take 5 mg by mouth 2 (two) times daily.    08/27/2017 at [place]  . cholecalciferol (VITAMIN D) 1000 UNITS tablet Take 1,000 Units by mouth daily.    08/27/2017 at Unknown time  . clotrimazole-betamethasone (LOTRISONE) cream Apply 1 application topically See admin instructions. Applied to face twice daily on MWF   08/26/2017 at Unknown time  . dofetilide (TIKOSYN) 250 MCG capsule Take 250 mcg by mouth 2 (two) times daily.   08/27/2017 at [place]  . famotidine (PEPCID) 20 MG tablet Take 20 mg by mouth 2 (two) times daily.    08/27/2017 at Unknown time  . ferrous sulfate 325 (65 FE) MG tablet Take 325 mg by mouth 2 (two) times daily with a meal.   08/27/2017 at Unknown time  . HYDROcodone-acetaminophen (NORCO/VICODIN) 5-325 MG per tablet Take 1 tablet by mouth every 8 (eight) hours as needed for moderate pain.    unknown  . ipratropium-albuterol (DUONEB) 0.5-2.5 (3) MG/3ML SOLN Take 3 mLs by nebulization 3 (three) times daily.   08/27/2017 at 1200  . isosorbide mononitrate (IMDUR) 15 mg TB24 24 hr tablet Take 15 mg by mouth daily.   08/27/2017 at Unknown time  . levothyroxine (SYNTHROID, LEVOTHROID) 137  MCG tablet Take 137 mcg by mouth every evening.    08/26/2017 at Unknown time  . magnesium hydroxide (MILK OF MAGNESIA) 400 MG/5ML suspension Take 30 mLs by mouth daily as needed for mild constipation.   unknown  . Melatonin 3 MG TABS Take 3 mg by mouth at bedtime.   08/26/2017 at Unknown time  . metoprolol (LOPRESSOR) 100 MG tablet Take 100 mg by mouth 2 (two) times daily.    08/27/2017 at [place]  . OXYGEN Inhale 2 L into the lungs daily as needed (for shortness of breath).    unknown  . PARoxetine (PAXIL) 40 MG tablet Take 40 mg by mouth every evening.   08/26/2017 at Unknown time  . phenol  (CHLORASEPTIC) 1.4 % LIQD Use as directed 2 sprays in the mouth or throat every 4 (four) hours as needed for throat irritation / pain.   unknown  . polyethylene glycol powder (MIRALAX) powder Take 17 g by mouth daily.    08/27/2017 at Unknown time  . pravastatin (PRAVACHOL) 40 MG tablet Take 40 mg by mouth at bedtime.    08/26/2017 at Unknown time  . sitaGLIPtin (JANUVIA) 50 MG tablet Take 50 mg by mouth daily.   08/27/2017 at Unknown time  . traZODone (DESYREL) 50 MG tablet Take 50 mg by mouth at bedtime.    08/26/2017 at Unknown time    Assessment: 83 yo male that presented with severe sepsis 6/8. Patient with acute hypoxemic respiratory failure, and acute metabolic encephalopathy. Due to inadequate oral intake and encephalopathy. Patient is chronically anticoagulated for afib with eliquis, but unable to take at this time. Last dose 6/8 at 0800. Pharmacy asked to start heparin. Will not load, d/t eliquis. Will monitor APTT for adjustments, d/t likelihood that anti-Xa levels will be altered.  Goal of Therapy:  Heparin level 0.3-0.7 units/ml aPTT 66-102 seconds Monitor platelets by anticoagulation protocol: Yes   Plan:  Start heparin infusion at 1100 units/hr Check anti-Xa level in 8 hours and daily while on heparin Continue to monitor H&H and platelets  Isac Sarna, BS Vena Austria, BCPS Clinical Pharmacist Pager [phone] 08/28/2017,12:24 PM

## 2017-08-28 NOTE — Progress Notes (Signed)
Spoke with RN re PICC line order.  RN has already notified CVW.

## 2017-08-28 NOTE — Progress Notes (Signed)
Patient appears to be improving he does not have a good cough but he can. Will probably need to ntsx him again. So far he  coughs and swallows. Family states he passed his swallow test. He has had a peg tube in past. He does not get out of bed. He is oriented to his world though it might not be our reality. He cannot use left side.

## 2017-08-28 NOTE — Progress Notes (Signed)
Lactic acid 2.9 called by lab 0100. MD notified via text page.

## 2017-08-28 NOTE — Progress Notes (Signed)
Lab called critical pTT value of > 200. Midlevel provider made aware via text page at 2240.

## 2017-08-28 NOTE — Progress Notes (Addendum)
PROGRESS NOTE    Alejandro Diaz  UKG:[id number] DOB: 28-Oct-1934 DOA: 08/27/2017 PCP: Jani Gravel, MD   Brief Narrative:   Alejandro Diaz is a 83 y.o. male with a history of atrial fibrillation on anticoagulation, CAD with stents in 2006 and 2009, COPD, history of embolic stroke with residual left hemiplegia, diabetes, GERD, hypertension, ischemic cardiomyopathy with EF of 40 to 45%, hypothyroidism.  Patient resides at [facility] .  He presented with worsening dyspnea and was thought to be volume overloaded.  He was actually admitted with severe sepsis with right lower lobe pneumonia suspected secondary to aspiration.  He is a DNR and patient's family do not wish for him to have any aggressive measures.  He has been placed on IV vancomycin and Zosyn for treatment and has had worsening hypotension and oliguria overnight for which he has received some IV fluid and albumin.   Assessment & Plan:   Principal Problem:   Severe sepsis ([place]) Active Problems:   AF (atrial fibrillation) (HCC)   Essential hypertension   Pressure ulcer   Anticoagulated   Hemiplegia affecting left nondominant side (HCC)   CAD (coronary artery disease)   COPD (chronic obstructive pulmonary disease) (HCC)   Disease of thyroid gland   DM type 2 (diabetes mellitus, type 2) (HCC)   GERD (gastroesophageal reflux disease)   RLL pneumonia ([place])   1. Severe sepsis secondary to right lower lobe pneumonia-suspect aspiration.  Patient has developing organ failure and hypotension with impending shock.  We will continue to monitor lactic acid and follow cultures.  Maintain on vancomycin and Zosyn as well as IV fluids.  Family is agreeable to PICC line placement which I believe is currently necessary as he may require pressors. 2. Acute metabolic encephalopathy secondary to above.  Patient is quite somnolent and will require ongoing close monitoring in ICU with neuro checks.  Continue treatment for sepsis. 3. Acute hypoxemic respiratory  failure.  This is likely related to the pneumonia process as well as some acute lung injury which is likely taken place.  Again family members do not wish for any life support measures. 4. Atrial fibrillation with some RVR.  Patient has been maintained on IV metoprolol pushes, but this does not appear adequate.  Will start IV Cardizem drip and monitor blood pressures carefully.  Additionally will remove Eliquis for now due to inadequate oral intake and encephalopathy and place on heparin drip. 2D echo for further evaluation as prior was on 03/2015. 5. COPD.  Nebulizer treatments with Xopenex/ipratropium due to A. fib with RVR. 6. Diabetes.  Monitor on sliding scale insulin.  7. GERD.  Maintain on IV Pepcid. 8. Hypertension.  Currently with soft blood pressure readings.  Hold antihypertensives except for IV metoprolol pushes and will add IV Cardizem drip with close monitoring.   DVT prophylaxis:Heparin drip Code Status: DNR Family Communication: Daughter at bedside Disposition Plan: Continue treatment of sepsis with IV abx and close monitoring; poor prognosis   Consultants:   None  Procedures:   None  Antimicrobials:   Vancomycin and Zosyn 6/8->   Subjective: Patient seen and evaluated today with no new acute complaints or concerns. No acute concerns or events noted overnight.  He continues to remain on high levels of oxygen and is otherwise quite somnolent, but arousable during laboratory checks.  Daughter at bedside.  Objective: Vitals:   08/28/17 0800 08/28/17 0814 08/28/17 0900 08/28/17 1000  BP: 133/88  106/75   Pulse: (!) 47  (!) 135 Marland Kitchen)  130  Resp: (!) 26  (!) 25 (!) 29  Temp: 99.1 F (37.3 C)  99 F (37.2 C) 99.1 F (37.3 C)  TempSrc:      SpO2: (!) 81% 100% 100% 100%  Weight:      Height:        Intake/Output Summary (Last 24 hours) at 08/28/2017 1202 Last data filed at 08/28/2017 6226 Gross per 24 hour  Intake 5710 ml  Output 50 ml  Net 5660 ml   Filed Weights     08/27/17 1819 08/28/17 0500  Weight: 88 kg (194 lb) 95.2 kg (209 lb 14.1 oz)    Examination:  General exam: Arousable, but somnolent. Respiratory system: Clear to auscultation. Respiratory effort normal.  On nonrebreather with high oxygen requirements. Cardiovascular system: Irregular and tachycardic. No JVD, murmurs, rubs, gallops or clicks. No pedal edema. Gastrointestinal system: Abdomen is nondistended, soft and nontender. No organomegaly or masses felt. Normal bowel sounds heard. Extremities: Cannot be fully assessed at this time. Skin: No rashes, lesions or ulcers    Data Reviewed: I have personally reviewed following labs and imaging studies  CBC: Recent Labs  Lab 08/27/17 1838 08/28/17 0525  WBC 10.2 16.4*  NEUTROABS 7.6  --   HGB 13.9 13.3  HCT 46.5 46.8  MCV 99.8 103.3*  PLT 382 333   Basic Metabolic Panel: Recent Labs  Lab 08/27/17 1838 08/28/17 0525  NA 142 144  K 5.5* 5.5*  CL 100* 109  CO2 32 25  GLUCOSE 161* 126*  BUN 21* 22*  CREATININE 1.57* 1.57*  CALCIUM 9.8 8.8*   GFR: Estimated Creatinine Clearance: 44.2 mL/min (A) (by C-G formula based on SCr of 1.57 mg/dL (H)). Liver Function Tests: Recent Labs  Lab 08/27/17 1838  AST 57*  ALT 25  ALKPHOS 88  BILITOT 1.0  PROT 7.7  ALBUMIN 2.8*   No results for input(s): LIPASE, AMYLASE in the last 168 hours. No results for input(s): AMMONIA in the last 168 hours. Coagulation Profile: No results for input(s): INR, PROTIME in the last 168 hours. Cardiac Enzymes: Recent Labs  Lab 08/27/17 1838  TROPONINI <0.03   BNP (last 3 results) No results for input(s): PROBNP in the last 8760 hours. HbA1C: No results for input(s): HGBA1C in the last 72 hours. CBG: Recent Labs  Lab 08/27/17 2147 08/28/17 0727 08/28/17 1158  GLUCAP 130* 116* 108*   Lipid Profile: No results for input(s): CHOL, HDL, LDLCALC, TRIG, CHOLHDL, LDLDIRECT in the last 72 hours. Thyroid Function Tests: No results for  input(s): TSH, T4TOTAL, FREET4, T3FREE, THYROIDAB in the last 72 hours. Anemia Panel: No results for input(s): VITAMINB12, FOLATE, FERRITIN, TIBC, IRON, RETICCTPCT in the last 72 hours. Sepsis Labs: Recent Labs  Lab 08/27/17 2057 08/28/17 0012 08/28/17 0525 08/28/17 0733 08/28/17 1039  PROCALCITON 0.23  --  0.51  --   --   LATICACIDVEN 2.4* 2.9*  --  2.4* 2.4*    Recent Results (from the past 240 hour(s))  Blood Culture (routine x 2)     Status: None (Preliminary result)   Collection Time: 08/27/17  6:50 PM  Result Value Ref Range Status   Specimen Description BLOOD RIGHT HAND  Final   Special Requests   Final    BOTTLES DRAWN AEROBIC ONLY Blood Culture results may not be optimal due to an inadequate volume of blood received in culture bottles Performed at [facility], [address], [place], [place] [address]    Culture PENDING  Incomplete  Report Status PENDING  Incomplete  MRSA PCR Screening     Status: Abnormal   Collection Time: 08/27/17  9:28 PM  Result Value Ref Range Status   MRSA by PCR POSITIVE (A) NEGATIVE Final    Comment:        The GeneXpert MRSA Assay (FDA approved for NASAL specimens only), is one component of a comprehensive MRSA colonization surveillance program. It is not intended to diagnose MRSA infection nor to guide or monitor treatment for MRSA infections. RESULT CALLED TO, READ BACK BY AND VERIFIED WITH: WAGNER,R @ 0217 ON 08/28/17 BY JUW Performed at [facility], [address] [place], [place] [address]          Radiology Studies: Dg Chest [facility] 1 View  Result Date: 08/27/2017 CLINICAL DATA:  Cough and shortness of breath. Sepsis. Possible aspiration. EXAM: PORTABLE CHEST 1 VIEW COMPARISON:  09/24/2014 FINDINGS: Mild cardiomegaly and tortuosity of thoracic aorta remains stable. New infiltrate is seen in the right lower lung, suspicious for pneumonia. Mild right pleural thickening versus tiny pleural effusion also noted. IMPRESSION: Right  lower lung infiltrate, suspicious for pneumonia. Tiny right pleural effusion versus pleural thickening. Electronically Signed   By: Earle Gell M.D.   On: 08/27/2017 19:02        Scheduled Meds: . apixaban  2.5 mg Oral BID  . Chlorhexidine Gluconate Cloth  6 each Topical Q0600  . dofetilide  250 mcg Oral BID  . insulin aspart  0-15 Units Subcutaneous TID AC & HS  . ipratropium-albuterol  3 mL Nebulization TID  . levothyroxine  75 mcg Intravenous QAC breakfast  . metoprolol tartrate  5 mg Intravenous Q3H  . mupirocin ointment  1 application Nasal BID   Continuous Infusions: . sodium chloride 100 mL/hr at 08/27/17 2217  . famotidine (PEPCID) IV Stopped (08/28/17 1010)  . piperacillin-tazobactam (ZOSYN)  IV 3.375 g (08/28/17 0923)  . vancomycin       LOS: 1 day    Time spent: 30 minutes    Pratik Darleen Crocker, DO [facility] Pager [phone]  If 7PM-7AM, please contact night-coverage [email or web address] Password [email or web address] 08/28/2017, 12:02 PM

## 2017-08-28 NOTE — Progress Notes (Signed)
Patient taken off 100% NRB and placed on 4LNC. Patient's breath sounds Rhonchi, able to cough a little to clear. RT will continue to monitor and suction as needed.

## 2017-08-28 NOTE — Progress Notes (Signed)
Suctioned out large amount of thick yellow tan secretions. With yanker and ntsx patient tolerate well some desaturation appears more comfortable after, Neb also given placed back on NRB mask.

## 2017-08-29 ENCOUNTER — Inpatient Hospital Stay (HOSPITAL_COMMUNITY): Payer: Medicare Other

## 2017-08-29 ENCOUNTER — Encounter (HOSPITAL_COMMUNITY): Payer: Self-pay | Admitting: Primary Care

## 2017-08-29 DIAGNOSIS — Z515 Encounter for palliative care: Secondary | ICD-10-CM

## 2017-08-29 DIAGNOSIS — Z7189 Other specified counseling: Secondary | ICD-10-CM

## 2017-08-29 DIAGNOSIS — I351 Nonrheumatic aortic (valve) insufficiency: Secondary | ICD-10-CM

## 2017-08-29 LAB — CBC
HCT: 40.6 % (ref 39.0–52.0)
Hemoglobin: 11.9 g/dL — ABNORMAL LOW (ref 13.0–17.0)
MCH: 29.7 pg (ref 26.0–34.0)
MCHC: 29.3 g/dL — AB (ref 30.0–36.0)
MCV: 101.2 fL — ABNORMAL HIGH (ref 78.0–100.0)
PLATELETS: 352 10*3/uL (ref 150–400)
RBC: 4.01 MIL/uL — ABNORMAL LOW (ref 4.22–5.81)
RDW: 15.1 % (ref 11.5–15.5)
WBC: 11.9 10*3/uL — AB (ref 4.0–10.5)

## 2017-08-29 LAB — URINE CULTURE: CULTURE: NO GROWTH

## 2017-08-29 LAB — COMPREHENSIVE METABOLIC PANEL
ALBUMIN: 2.4 g/dL — AB (ref 3.5–5.0)
ALT: 252 U/L — ABNORMAL HIGH (ref 17–63)
ANION GAP: 9 (ref 5–15)
AST: 440 U/L — ABNORMAL HIGH (ref 15–41)
Alkaline Phosphatase: 76 U/L (ref 38–126)
BILIRUBIN TOTAL: 1 mg/dL (ref 0.3–1.2)
BUN: 30 mg/dL — ABNORMAL HIGH (ref 6–20)
CHLORIDE: 111 mmol/L (ref 101–111)
CO2: 25 mmol/L (ref 22–32)
Calcium: 8.5 mg/dL — ABNORMAL LOW (ref 8.9–10.3)
Creatinine, Ser: 1.75 mg/dL — ABNORMAL HIGH (ref 0.61–1.24)
GFR calc Af Amer: 40 mL/min — ABNORMAL LOW (ref >60)
GFR calc non Af Amer: 35 mL/min — ABNORMAL LOW (ref >60)
GLUCOSE: 117 mg/dL — AB (ref 65–99)
POTASSIUM: 4.5 mmol/L (ref 3.5–5.1)
SODIUM: 145 mmol/L (ref 135–145)
TOTAL PROTEIN: 6 g/dL — AB (ref 6.5–8.1)

## 2017-08-29 LAB — GLUCOSE, CAPILLARY
GLUCOSE-CAPILLARY: 112 mg/dL — AB (ref 65–99)
GLUCOSE-CAPILLARY: 127 mg/dL — AB (ref 65–99)
Glucose-Capillary: 106 mg/dL — ABNORMAL HIGH (ref 65–99)
Glucose-Capillary: 110 mg/dL — ABNORMAL HIGH (ref 65–99)

## 2017-08-29 LAB — HIV ANTIBODY (ROUTINE TESTING W REFLEX): HIV Screen 4th Generation wRfx: NONREACTIVE

## 2017-08-29 LAB — PROCALCITONIN: PROCALCITONIN: 0.68 ng/mL

## 2017-08-29 LAB — ECHOCARDIOGRAM COMPLETE
HEIGHTINCHES: 72 in
Weight: 3358.05 oz

## 2017-08-29 LAB — APTT
APTT: 180 s — AB (ref 24–36)
APTT: 81 s — AB (ref 24–36)

## 2017-08-29 LAB — AMMONIA: Ammonia: 25 umol/L (ref 9–35)

## 2017-08-29 LAB — HEPARIN LEVEL (UNFRACTIONATED): Heparin Unfractionated: >2.2 IU/mL — ABNORMAL HIGH (ref 0.30–0.70)

## 2017-08-29 LAB — LACTIC ACID, PLASMA: Lactic Acid, Venous: 1.6 mmol/L (ref 0.5–1.9)

## 2017-08-29 MED ORDER — HEPARIN (PORCINE) IN NACL 100-0.45 UNIT/ML-% IJ SOLN
800.0000 [IU]/h | INTRAMUSCULAR | Status: DC
  Administered 2017-08-29 (×2): 800 [IU]/h via INTRAVENOUS
  Filled 2017-08-29: qty 250

## 2017-08-29 MED ORDER — ORAL CARE MOUTH RINSE
15.0000 mL | Freq: Two times a day (BID) | OROMUCOSAL | Status: DC
  Administered 2017-08-30 – 2017-08-31 (×4): 15 mL via OROMUCOSAL

## 2017-08-29 NOTE — Progress Notes (Signed)
*  PRELIMINARY RESULTS* Echocardiogram 2D Echocardiogram has been performed.  Alejandro Diaz 08/29/2017, 4:24 PM

## 2017-08-29 NOTE — Consult Note (Signed)
Consultation Note Date: 08/29/2017   Patient Name: Alejandro Diaz  DOB: 08/05/1934  MRN: [id number]  Age / Sex: 83 y.o., male  PCP: Alejandro Gravel, MD Referring Physician: Rodena Goldmann, DO  Reason for Consultation: Establishing goals of care  HPI/Patient Profile: 83 y.o. male  with past medical history of atrial fibrillation on anticoagulation, coronary artery disease with stents in 06 and 09, COPD, history of embolic stroke with residual left hemiplegia, diabetes, high blood pressure, GERD, ischemic cardiomyopathy myopathy with EF of 40 to 45%, resident of SNF, [facility], for approximately 3 years admitted on 08/27/2017 with severe sepsis.   Clinical Assessment and Goals of Care: I have reviewed medical records including EPIC notes, labs and imaging, received report from nursing staff, assessed the patient and then met at the bedside along with daughter Alejandro Diaz, her husband Alejandro Diaz, and their minor child, to discuss diagnosis prognosis, GOC, EOL wishes, disposition and options.  I introduced Palliative Medicine as specialized medical care for people living with serious illness. It focuses on providing relief from the symptoms and stress of a serious illness. The goal is to improve quality of life for both the patient and the family.  We discussed a brief life review of the patient.  He worked Architect, and for Alejandro Diaz.  His wife died approximately 4 years ago, a brother he was very close to died 2 weeks ago, he is been a resident of SNF for approximately 3 years.  As far as functional and nutritional status, family states that he is wanting to spend more more time in bed.  Not wanting to get up or get a bath.   We discussed their current illness and what it means in the larger context of their on-going co-morbidities.  Natural disease trajectory and expectations at EOL were discussed.  I share a diagram of the  chronic illness pathway, what is normal and expected.    I attempted to elicit values and goals of care important to the patient.  Daughter Alejandro Diaz states that a couple weeks ago, Alejandro Diaz told her that he would like to go to the beach, roll his wheelchair into the water, and be "eaten by the sharks".  The difference between aggressive medical intervention and comfort care was considered in light of the patient's goals of care.  We talked in detail about DNR (treat the treatable), allowing natural death, and the concept of let nature take its course.  I gave an example of when it would be appropriate to let nature take its course, not illegal, not unethical, but some people have a moral problem with not doing everything.  Family states that they would not put Alejandro Diaz through hemodialysis  Advanced directives, concepts specific to code status, artifical feeding and hydration, and rehospitalization were considered and discussed.  We talked about the benefits of residential hospice, and briefly discussed in-home hospice (family is not able to accommodate Alejandro Diaz at home).  We talked about comfort and dignity at end of life.  Hospice  and Palliative Care services outpatient were explained and offered.  Alejandro Diaz is somewhat tearful stating that her mother was in hospice and they did not give her anything to eat or drink for 9 days.  We talked about what is normal and expected at end of life including sleeping more, eating and interacting less.  I shared that when people are asleep, they are not hungry.  Also share that if he was not awake enough, he could not take in food or liquid.  I share how to make choices for loved ones including 1) keeping them at the center of decision-making, 2) are we doing something to them or for them (can we change what is happening)  3) what would the person he was 10 years ago say about where he is now.  Questions and concerns were addressed.  The family was encouraged to call with  questions or concerns.  Family meeting scheduled for 6/11 at 10:30 AM.  Healthcare power of attorney HCPOA -daughter Alejandro Diaz tells me that she has legal healthcare power of attorney.  She produces a DURABLE Alejandro Diaz.  Discussed the difference with her.  Alejandro Diaz states she believes she also has healthcare power of attorney and will look for paperwork.   SUMMARY OF RECOMMENDATIONS   At this point, continue to treat the treatable, 24 hours for outcomes. Family meeting 6/11 at 10:30 AM.  Code Status/Advance Care Planning:  DNR  Symptom Management:   Per hospitalist, no additional needs at this time.  Palliative Prophylaxis:   Frequent Pain Assessment and Turn Reposition  Additional Recommendations (Limitations, Scope, Preferences):  Continue to treat the treatable but no CPR, no intubation.  Psycho-social/Spiritual:   Desire for further Chaplaincy support:no  Additional Recommendations: Caregiving  Support/Resources and Education on Hospice  Prognosis:   Unable to determine, based on outcomes, and family choices.   Discharge Planning: To Be Determined     Primary Diagnoses: Present on Admission: . RLL pneumonia (Alejandro Diaz) . Severe sepsis (Alejandro Diaz) . GERD (gastroesophageal reflux disease) . Essential hypertension . Pressure ulcer . Hemiplegia affecting left nondominant side (Alejandro Diaz) . COPD (chronic obstructive pulmonary disease) (Alejandro Diaz) . AF (atrial fibrillation) (Alejandro Diaz) . CAD (coronary artery disease) . Disease of thyroid gland   I have reviewed the medical record, interviewed the patient and family, and examined the patient. The following aspects are pertinent.  Past Medical History:  Diagnosis Date  . Arthritis   . Atrial fibrillation (Alejandro Diaz)   . Atypical chest pain 12/02/2015  . CAD (coronary artery disease)    Stents to unknown arteries in 2006,2009  . Cancer (Alejandro Diaz)    melanoma  . COPD (chronic obstructive pulmonary disease) (Alejandro Diaz)   . CVA (cerebral infarction)  04/6376   Embolic Left sided hemiplegia.   . Diabetes mellitus without complication (Alejandro Diaz)   . Dysphagia   . GERD (gastroesophageal reflux disease)   . Goals of care, counseling/discussion 12/02/2015  . Hemiplegia ([place])    left sided  . Hemiplegia affecting left nondominant side ([place]) 12/02/2015  . Hip pain 03/04/2016  . Hypertension   . Ischemic cardiomyopathy    EF of 40-45%  . MRSA bacteremia 12/02/2015  . Personal history of malignant melanoma of skin   . Rash and nonspecific skin eruption 12/02/2015  . Thyroid disease    Social History   Socioeconomic History  . Marital status: Married    Spouse name: Not on file  . Number of children: Not on file  . Years of education: Not  on file  . Highest education level: Not on file  Occupational History  . Not on file  Social Needs  . Financial resource strain: Not on file  . Food insecurity:    Worry: Not on file    Inability: Not on file  . Transportation needs:    Medical: Not on file    Non-medical: Not on file  Tobacco Use  . Smoking status: Former Smoker    Last attempt to quit: 03/22/2010    Years since quitting: 7.4  . Smokeless tobacco: Never Used  Substance and Sexual Activity  . Alcohol use: No    Alcohol/week: 0.0 oz  . Drug use: No  . Sexual activity: Not Currently  Lifestyle  . Physical activity:    Days per week: Not on file    Minutes per session: Not on file  . Stress: Not on file  Relationships  . Social connections:    Talks on phone: Not on file    Gets together: Not on file    Attends religious service: Not on file    Active member of club or organization: Not on file    Attends meetings of clubs or organizations: Not on file    Relationship status: Not on file  Other Topics Concern  . Not on file  Social History Narrative  . Not on file   Family History  Problem Relation Age of Onset  . Heart attack Father    Scheduled Meds: . Chlorhexidine Gluconate Cloth  6 each Topical Q0600  .  Chlorhexidine Gluconate Cloth  6 each Topical Daily  . dofetilide  250 mcg Oral BID  . insulin aspart  0-15 Units Subcutaneous TID AC & HS  . ipratropium-albuterol  3 mL Nebulization TID  . levothyroxine  75 mcg Intravenous QAC breakfast  . metoprolol tartrate  5 mg Intravenous Q3H  . mupirocin ointment  1 application Nasal BID  . sodium chloride flush  10-40 mL Intracatheter Q12H   Continuous Infusions: . sodium chloride 125 mL/hr at 08/29/17 1237  . diltiazem (CARDIZEM) infusion 5 mg/hr (08/29/17 0086)  . famotidine (PEPCID) IV Stopped (08/29/17 1123)  . heparin 800 Units/hr (08/29/17 1022)  . piperacillin-tazobactam (ZOSYN)  IV 3.375 g (08/29/17 1019)  . vancomycin Stopped (08/28/17 2142)   PRN Meds:.LORazepam, sodium chloride flush Medications Prior to Admission:  Prior to Admission medications   Medication Sig Start Date End Date Taking? Authorizing Provider  albuterol (PROVENTIL HFA;VENTOLIN HFA) 108 (90 BASE) MCG/ACT inhaler Inhale 1-2 puffs into the lungs every 6 (six) hours as needed for wheezing or shortness of breath. 06/16/13  Yes Carmin Muskrat, MD  alum & mag hydroxide-simeth (MAALOX REGULAR STRENGTH) 200-200-20 MG/5ML suspension Take 30 mLs by mouth every 6 (six) hours as needed for heartburn.   Yes [provider]  Amino Acids-Protein Hydrolys (FEEDING SUPPLEMENT, PRO-STAT SUGAR FREE 64,) LIQD Take 30 mLs by mouth 2 (two) times daily.   Yes [provider]  apixaban (ELIQUIS) 5 MG TABS tablet Take 5 mg by mouth 2 (two) times daily.    Yes [provider]  cholecalciferol (VITAMIN D) 1000 UNITS tablet Take 1,000 Units by mouth daily.    Yes [provider]  clotrimazole-betamethasone (LOTRISONE) cream Apply 1 application topically See admin instructions. Applied to face twice daily on MWF   Yes [provider]  dofetilide (TIKOSYN) 250 MCG capsule Take 250 mcg by mouth 2 (two) times daily.   Yes [provider]    famotidine (  PEPCID) 20 MG tablet Take 20 mg by mouth 2 (two) times daily.    Yes [provider]  ferrous sulfate 325 (65 FE) MG tablet Take 325 mg by mouth 2 (two) times daily with a meal.   Yes [provider]  HYDROcodone-acetaminophen (NORCO/VICODIN) 5-325 MG per tablet Take 1 tablet by mouth every 8 (eight) hours as needed for moderate pain.    Yes [provider]  ipratropium-albuterol (DUONEB) 0.5-2.5 (3) MG/3ML SOLN Take 3 mLs by nebulization 3 (three) times daily.   Yes [provider]  isosorbide mononitrate (IMDUR) 15 mg TB24 24 hr tablet Take 15 mg by mouth daily.   Yes [provider]  levothyroxine (SYNTHROID, LEVOTHROID) 137 MCG tablet Take 137 mcg by mouth every evening.    Yes [provider]  magnesium hydroxide (MILK OF MAGNESIA) 400 MG/5ML suspension Take 30 mLs by mouth daily as needed for mild constipation.   Yes [provider]  Melatonin 3 MG TABS Take 3 mg by mouth at bedtime.   Yes [provider]  metoprolol (LOPRESSOR) 100 MG tablet Take 100 mg by mouth 2 (two) times daily.    Yes [provider]  OXYGEN Inhale 2 L into the lungs daily as needed (for shortness of breath).    Yes [provider]  PARoxetine (PAXIL) 40 MG tablet Take 40 mg by mouth every evening.   Yes [provider]  phenol (CHLORASEPTIC) 1.4 % LIQD Use as directed 2 sprays in the mouth or throat every 4 (four) hours as needed for throat irritation / pain.   Yes [provider]  polyethylene glycol powder (MIRALAX) powder Take 17 g by mouth daily.    Yes [provider]  pravastatin (PRAVACHOL) 40 MG tablet Take 40 mg by mouth at bedtime.    Yes [provider]  sitaGLIPtin (JANUVIA) 50 MG tablet Take 50 mg by mouth daily.   Yes [provider]  traZODone (DESYREL) 50 MG tablet Take 50 mg by mouth at bedtime.    Yes [provider]   No Known Allergies Review  of Systems  Unable to perform ROS: Other    Physical Exam  Constitutional: He appears ill.  Appears acutely/chronically ill  HENT:  Head: Atraumatic.  Cardiovascular: Normal rate.  Pulmonary/Chest: Effort normal. No respiratory distress.  Abdominal: Soft. He exhibits no distension.  Neurological:  Will open eyes but not communicate  Skin: Skin is warm and dry.  Nursing note and vitals reviewed.   Vital Signs: BP 123/70   Pulse (!) 103   Temp 98.1 F (36.7 C)   Resp (!) 24   Ht 6' (1.829 m)   Wt 95.2 kg (209 lb 14.1 oz)   SpO2 100%   BMI 28.46 kg/m  Pain Scale: 0-10 POSS *See Group Information*: 2-Acceptable,Slightly drowsy, easily aroused Pain Score: 0-No pain   SpO2: SpO2: 100 % O2 Device:SpO2: 100 % O2 Flow Rate: .O2 Flow Rate (L/min): 3 L/min  IO: Intake/output summary:   Intake/Output Summary (Last 24 hours) at 08/29/2017 1332 Last data filed at 08/29/2017 0000 Gross per 24 hour  Intake 1977.81 ml  Output -  Net 1977.81 ml    LBM: Last BM Date: 08/27/17 Baseline Weight: Weight: 88 kg (194 lb) Most recent weight: Weight: 95.2 kg (209 lb 14.1 oz)     Palliative Assessment/Data:   Flowsheet Rows     Most Recent Value  Intake Tab  Referral Department  Hospitalist  Unit at  Time of Referral  Intermediate Care Unit  Date Notified  08/29/17  Palliative Care Type  New Palliative care  Reason for referral  Clarify Goals of Care  Date of Admission  08/27/17  Date first seen by Palliative Care  08/29/17  # of days Palliative referral response time  0 Day(s)  # of days IP prior to Palliative referral  2  Clinical Assessment  Psychosocial & Spiritual Assessment  Palliative Care Outcomes      Time In: 1250 Time Out: 1405 Time Total: 75 Greater than 50%  of this time was spent counseling and coordinating care related to the above assessment and plan.  Signed by: Drue Novel, NP   Please contact Palliative Medicine Team phone at [phone] for questions  and concerns.  For individual provider: See Shea Evans

## 2017-08-29 NOTE — Evaluation (Signed)
Clinical/Bedside Swallow Evaluation Patient Details  Name: Alejandro Diaz MRN: [id number] Date of Birth: [birth year]  Today's Date: 08/29/2017 Time: SLP Start Time (ACUTE ONLY): 1300 SLP Stop Time (ACUTE ONLY): 1324 SLP Time Calculation (min) (ACUTE ONLY): 24 min  Past Medical History:  Past Medical History:  Diagnosis Date  . Arthritis   . Atrial fibrillation ([place])   . Atypical chest pain 12/02/2015  . CAD (coronary artery disease)    Stents to unknown arteries in 2006,2009  . Cancer ([place])    melanoma  . COPD (chronic obstructive pulmonary disease) ([place])   . CVA (cerebral infarction) 0/0867   Embolic Left sided hemiplegia.   . Diabetes mellitus without complication ([employer])   . Dysphagia   . GERD (gastroesophageal reflux disease)   . Goals of care, counseling/discussion 12/02/2015  . Hemiplegia ([place])    left sided  . Hemiplegia affecting left nondominant side ([place]) 12/02/2015  . Hip pain 03/04/2016  . Hypertension   . Ischemic cardiomyopathy    EF of 40-45%  . MRSA bacteremia 12/02/2015  . Personal history of malignant melanoma of skin   . Rash and nonspecific skin eruption 12/02/2015  . Thyroid disease    Past Surgical History:  Past Surgical History:  Procedure Laterality Date  . BACK SURGERY    . BRAIN SURGERY    . CARDIAC SURGERY    . CORONARY STENT PLACEMENT    . PERCUTANEOUS ENDOSCOPIC GASTROSTOMY (PEG) REMOVAL N/A 03/19/2015   Procedure: PERCUTANEOUS ENDOSCOPIC GASTROSTOMY (PEG) REMOVAL;  Surgeon: Danie Binder, MD;  Location: [facility];  Service: Endoscopy;  Laterality: N/A;  1200  . SPLENECTOMY     HPI:  Alejandro Diaz is a 83 y.o. male with a history of atrial fibrillation on anticoagulation, CAD with stents in 2006 and 2009, COPD, history of embolic stroke with residual left hemiplegia, diabetes, GERD, hypertension, ischemic cardiomyopathy with EF of 40 to 45%, hypothyroidism.  Patient resides at [facility] .  He presented with worsening dyspnea and was thought to be  volume overloaded.  He was actually admitted with severe sepsis with right lower lobe pneumonia suspected secondary to aspiration.  He is a DNR and patient's family do not wish for him to have any aggressive measures.  He has been placed on IV vancomycin and Zosyn for treatment and has had worsening hypotension and oliguria overnight for which he has received some IV fluid and albumin.   Assessment / Plan / Recommendation Clinical Impression  Clinical swallow evaluation completed at bedside with family present in room. Previous swallow history reviewed with Pt's daughter. Pt had a basal ganglia stroke in 2016 and required PEG and altered textures. He had the PEG removed and consumed a soft diet with thin liquids for the past couple of years at [employer] [facility] [employer]. His daughter reports a general decline at [facility] in recent past- he no longer gets up from bed and goes to dining room for meals. 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   Pt leaning to the right in bed and has residual left hemiparesis (also note that Pt has RLL PNA). Pt with prolonged oral phase across consistencies and suspected delay in swallow initiation. Pt with delayed congested cough following sips of water (although Pt with congested cough prior to trials). Pt appears fatigued and with reduced ability to generate strong cough. This, along with previous h/o dysphagia, COPD, and  PNA increases Pt's risk for aspiration. Recommend NPO except for po meds in applesauce and OK for ice chips/small sips of water following oral care and plan to complete MBSS tomorrow. SLP discussed with Pt's daughter and RN who are in agreement with plan of care. SLP will follow tomorrow.   SLP Visit Diagnosis: Dysphagia, unspecified (R13.10)    Aspiration Risk  Moderate aspiration risk    Diet Recommendation NPO except meds;Ice chips PRN after oral care   Medication  Administration: Whole meds with puree    Other  Recommendations Oral Care Recommendations: Oral care prior to ice chip/H20;Staff/trained caregiver to provide oral care;Oral care QID   Follow up Recommendations 24 hour supervision/assistance;Skilled Nursing facility      Frequency and Duration min 2x/week  1 week       Prognosis Prognosis for Safe Diet Advancement: Fair Barriers to Reach Goals: Severity of deficits      Swallow Study   General Date of Onset: 08/27/17 HPI: Alejandro Diaz is a 83 y.o. male with a history of atrial fibrillation on anticoagulation, CAD with stents in 2006 and 2009, COPD, history of embolic stroke with residual left hemiplegia, diabetes, GERD, hypertension, ischemic cardiomyopathy with EF of 40 to 45%, hypothyroidism.  Patient resides at [facility] .  He presented with worsening dyspnea and was thought to be volume overloaded.  He was actually admitted with severe sepsis with right lower lobe pneumonia suspected secondary to aspiration.  He is a DNR and patient's family do not wish for him to have any aggressive measures.  He has been placed on IV vancomycin and Zosyn for treatment and has had worsening hypotension and oliguria overnight for which he has received some IV fluid and albumin. Type of Study: Bedside Swallow Evaluation Previous Swallow Assessment: MBSS in 2016 with rec for D3/NTL Diet Prior to this Study: NPO Temperature Spikes Noted: No Respiratory Status: Nasal cannula History of Recent Intubation: No Behavior/Cognition: Alert;Cooperative;Pleasant mood Oral Cavity Assessment: Within Functional Limits Oral Care Completed by SLP: Recent completion by staff Oral Cavity - Dentition: Missing dentition Vision: Functional for self-feeding Self-Feeding Abilities: Needs assist Patient Positioning: Upright in bed Baseline Vocal Quality: Low vocal intensity Volitional Cough: Weak Volitional Swallow: Able to elicit    Oral/Motor/Sensory Function Overall Oral  Motor/Sensory Function: Within functional limits   Ice Chips Ice chips: Impaired Presentation: Spoon Pharyngeal Phase Impairments: Suspected delayed Swallow   Thin Liquid Thin Liquid: Impaired Presentation: Cup;Straw Pharyngeal  Phase Impairments: Suspected delayed Swallow;Cough - Delayed    Nectar Thick Nectar Thick Liquid: Not tested   Honey Thick Honey Thick Liquid: Not tested   Puree Puree: Within functional limits Presentation: Spoon   Solid   Thank you,  Genene Churn, [place]    Solid: Not tested        PORTER,DABNEY 08/29/2017,1:45 PM

## 2017-08-29 NOTE — Progress Notes (Signed)
PROGRESS NOTE    Alejandro Diaz  UMP:[id number] DOB: 20-Feb-1935 DOA: 08/27/2017 PCP: Alejandro Gravel, MD   Brief Narrative:   Alejandro Diaz is a 83 y.o. male with a history of atrial fibrillation on anticoagulation, CAD with stents in 2006 and 2009, COPD, history of embolic stroke with residual left hemiplegia, diabetes, GERD, hypertension, ischemic cardiomyopathy with EF of 40 to 45%, hypothyroidism.  Patient resides at [facility] .  He presented with worsening dyspnea and was thought to be volume overloaded.  He was actually admitted with severe sepsis with right lower lobe pneumonia suspected secondary to aspiration.  He is a DNR and patient's family do not wish for him to have any aggressive measures.  He has been placed on IV vancomycin and Zosyn for treatment and has had worsening hypotension and oliguria overnight for which he has received some IV fluid and albumin.   Assessment & Plan:   Principal Problem:   Severe sepsis ([place]) Active Problems:   AF (atrial fibrillation) (HCC)   Essential hypertension   Pressure ulcer   Anticoagulated   Hemiplegia affecting left nondominant side (HCC)   CAD (coronary artery disease)   COPD (chronic obstructive pulmonary disease) (HCC)   Disease of thyroid gland   DM type 2 (diabetes mellitus, type 2) (HCC)   GERD (gastroesophageal reflux disease)   RLL pneumonia ([place])   1. Severe sepsis secondary to right lower lobe pneumonia-suspect aspiration.  Continue vancomycin and Zosyn and follow cultures.  Leukocytosis and lactic acid levels appear to have improved.  Continue IV fluid.  Urine and blood cultures with no growth noted thus far.  Palliative care to be consulted due to poor prognosis. Has a PICC line. 2. Acute metabolic encephalopathy secondary to above.  He is more alert, but confused.  Will check ammonia levels.  Speech therapy to see patient and will consider diet initiation after evaluation. 3. Transaminitis.  This is likely secondary to liver shock  from above processes.  Will recheck a.m. labs. 4. Acute hypoxemic respiratory failure.  This is likely related to the pneumonia process as well as some acute lung injury which is likely taken place.  Again family members do not wish for any life support measures. 5. Atrial fibrillation with some RVR-improving.  Patient has been maintained on IV metoprolol pushes, but this does not appear adequate.  Continue Cardizem and heparin drips for now. 6. COPD.  Nebulizer treatments with Xopenex/ipratropium due to A. fib with RVR. 7. Diabetes.  Monitor on sliding scale insulin.  8. GERD.  Maintain on IV Pepcid. 9. Hypertension.  Currently with soft blood pressure readings.  Hold antihypertensives except for IV metoprolol pushes and will add IV Cardizem drip with close monitoring.   DVT prophylaxis:Heparin drip Code Status: DNR Family Communication: Daughter at bedside Disposition Plan: Continue treatment of sepsis with IV abx and close monitoring; poor prognosis   Consultants:   None  Procedures:   None  Antimicrobials:   Vancomycin and Zosyn 6/8->   Subjective: Patient seen and evaluated today.  No acute events noted overnight and he is more arousable, but confused.  Family members seem to think that he is improving.  PICC line has been placed yesterday.  Objective: Vitals:   08/29/17 0800 08/29/17 0900 08/29/17 1000 08/29/17 1100  BP: 106/66 (!) 104/56 123/70   Pulse: 78 77 (!) 101 (!) 103  Resp: 19 20 (!) 22 (!) 24  Temp: 97.9 F (36.6 C) (!) 97.3 F (36.3 C) 97.7 F (36.5  C) 98.1 F (36.7 C)  TempSrc:      SpO2: 100% 100% 100% 100%  Weight:      Height:        Intake/Output Summary (Last 24 hours) at 08/29/2017 1243 Last data filed at 08/29/2017 0000 Gross per 24 hour  Intake 1977.81 ml  Output -  Net 1977.81 ml   Filed Weights   08/27/17 1819 08/28/17 0500  Weight: 88 kg (194 lb) 95.2 kg (209 lb 14.1 oz)    Examination:  General exam: Arousable, but  somnolent. Respiratory system: Clear to auscultation. Respiratory effort normal.  On nonrebreather with high oxygen requirements. Cardiovascular system: Irregular. No JVD, murmurs, rubs, gallops or clicks. No pedal edema. Gastrointestinal system: Abdomen is nondistended, soft and nontender. No organomegaly or masses felt. Normal bowel sounds heard. Extremities: Cannot be fully assessed at this time. Skin: No rashes, lesions or ulcers    Data Reviewed: I have personally reviewed following labs and imaging studies  CBC: Recent Labs  Lab 08/27/17 1838 08/28/17 0525 08/29/17 0455  WBC 10.2 16.4* 11.9*  NEUTROABS 7.6  --   --   HGB 13.9 13.3 11.9*  HCT 46.5 46.8 40.6  MCV 99.8 103.3* 101.2*  PLT 382 310 213   Basic Metabolic Panel: Recent Labs  Lab 08/27/17 1838 08/28/17 0525 08/29/17 0455  NA 142 144 145  K 5.5* 5.5* 4.5  CL 100* 109 111  CO2 32 25 25  GLUCOSE 161* 126* 117*  BUN 21* 22* 30*  CREATININE 1.57* 1.57* 1.75*  CALCIUM 9.8 8.8* 8.5*   GFR: Estimated Creatinine Clearance: 39.6 mL/min (A) (by C-G formula based on SCr of 1.75 mg/dL (H)). Liver Function Tests: Recent Labs  Lab 08/27/17 1838 08/29/17 0455  AST 57* 440*  ALT 25 252*  ALKPHOS 88 76  BILITOT 1.0 1.0  PROT 7.7 6.0*  ALBUMIN 2.8* 2.4*   No results for input(s): LIPASE, AMYLASE in the last 168 hours. No results for input(s): AMMONIA in the last 168 hours. Coagulation Profile: No results for input(s): INR, PROTIME in the last 168 hours. Cardiac Enzymes: Recent Labs  Lab 08/27/17 1838  TROPONINI <0.03   BNP (last 3 results) No results for input(s): PROBNP in the last 8760 hours. HbA1C: No results for input(s): HGBA1C in the last 72 hours. CBG: Recent Labs  Lab 08/28/17 1158 08/28/17 1630 08/28/17 2003 08/29/17 0805 08/29/17 1144  GLUCAP 108* 103* 113* 106* 112*   Lipid Profile: No results for input(s): CHOL, HDL, LDLCALC, TRIG, CHOLHDL, LDLDIRECT in the last 72 hours. Thyroid  Function Tests: No results for input(s): TSH, T4TOTAL, FREET4, T3FREE, THYROIDAB in the last 72 hours. Anemia Panel: No results for input(s): VITAMINB12, FOLATE, FERRITIN, TIBC, IRON, RETICCTPCT in the last 72 hours. Sepsis Labs: Recent Labs  Lab 08/27/17 2057 08/28/17 0012 08/28/17 0525 08/28/17 0733 08/28/17 1039 08/29/17 0455  PROCALCITON 0.23  --  0.51  --   --  0.68  LATICACIDVEN 2.4* 2.9*  --  2.4* 2.4* 1.6    Recent Results (from the past 240 hour(s))  Urine culture     Status: None   Collection Time: 08/27/17  6:38 PM  Result Value Ref Range Status   Specimen Description   Final    URINE, CATHETERIZED Performed at [facility], [address], [place], [place] [address]    Special Requests   Final    NONE Performed at [facility], [address], [place], [place] [address]    Culture   Final  NO GROWTH Performed at [facility], [place] [address] [place], [place] [address]    Report Status 08/29/2017 FINAL  Final  Blood Culture (routine x 2)     Status: None (Preliminary result)   Collection Time: 08/27/17  6:50 PM  Result Value Ref Range Status   Specimen Description BLOOD RIGHT HAND  Final   Special Requests   Final    BOTTLES DRAWN AEROBIC ONLY Blood Culture results may not be optimal due to an inadequate volume of blood received in culture bottles   Culture   Final    NO GROWTH 2 DAYS Performed at [facility], [address], [place], [place] [address]    Report Status PENDING  Incomplete  MRSA PCR Screening     Status: Abnormal   Collection Time: 08/27/17  9:28 PM  Result Value Ref Range Status   MRSA by PCR POSITIVE (A) NEGATIVE Final    Comment:        The GeneXpert MRSA Assay (FDA approved for NASAL specimens only), is one component of a comprehensive MRSA colonization surveillance program. It is not intended to diagnose MRSA infection nor to guide or monitor treatment for MRSA infections. RESULT CALLED TO, READ BACK BY AND VERIFIED  WITH: WAGNER,R @ 0217 ON 08/28/17 BY JUW Performed at [facility], [address] [place],  [address]          Radiology Studies: Dg Chest [facility] 1 View  Result Date: 08/27/2017 CLINICAL DATA:  Cough and shortness of breath. Sepsis. Possible aspiration. EXAM: PORTABLE CHEST 1 VIEW COMPARISON:  09/24/2014 FINDINGS: Mild cardiomegaly and tortuosity of thoracic aorta remains stable. New infiltrate is seen in the right lower lung, suspicious for pneumonia. Mild right pleural thickening versus tiny pleural effusion also noted. IMPRESSION: Right lower lung infiltrate, suspicious for pneumonia. Tiny right pleural effusion versus pleural thickening. Electronically Signed   By: Earle Gell M.D.   On: 08/27/2017 19:02        Scheduled Meds: . Chlorhexidine Gluconate Cloth  6 each Topical Q0600  . Chlorhexidine Gluconate Cloth  6 each Topical Daily  . dofetilide  250 mcg Oral BID  . insulin aspart  0-15 Units Subcutaneous TID AC & HS  . ipratropium-albuterol  3 mL Nebulization TID  . levothyroxine  75 mcg Intravenous QAC breakfast  . metoprolol tartrate  5 mg Intravenous Q3H  . mupirocin ointment  1 application Nasal BID  . sodium chloride flush  10-40 mL Intracatheter Q12H   Continuous Infusions: . sodium chloride 125 mL/hr at 08/29/17 1237  . diltiazem (CARDIZEM) infusion 5 mg/hr (08/29/17 9323)  . famotidine (PEPCID) IV Stopped (08/29/17 1123)  . heparin 800 Units/hr (08/29/17 1022)  . piperacillin-tazobactam (ZOSYN)  IV 3.375 g (08/29/17 1019)  . vancomycin Stopped (08/28/17 2142)     LOS: 2 days    Time spent: 30 minutes    Allis Quirarte Darleen Crocker, DO [facility] Pager [phone]  If 7PM-7AM, please contact night-coverage [email or web address] Password [facility] 08/29/2017, 12:43 PM

## 2017-08-29 NOTE — Progress Notes (Signed)
ANTICOAGULATION CONSULT NOTE - Initial Consult  Pharmacy Consult for Heparin Indication: atrial fibrillation  No Known Allergies  Patient Measurements: Height: 6' (182.9 cm) Weight: 209 lb 14.1 oz (95.2 kg) IBW/kg (Calculated) : 77.6 HEPARIN DW (KG): 88  Vital Signs: Temp: 98.1 F (36.7 C) (06/10 1100) BP: 123/70 (06/10 1000) Pulse Rate: 103 (06/10 1100)  Labs: Recent Labs    08/27/17 1838 08/28/17 0525  08/28/17 1314 08/28/17 2136 08/29/17 0455 08/29/17 1630  HGB 13.9 13.3  --   --   --  11.9*  --   HCT 46.5 46.8  --   --   --  40.6  --   PLT 382 310  --   --   --  352  --   APTT  --   --    < > 40* >200* 180* 81*  HEPARINUNFRC  --   --   --  >2.20*  --  >2.20*  --   CREATININE 1.57* 1.57*  --   --   --  1.75*  --   TROPONINI <0.03  --   --   --   --   --   --    < > = values in this interval not displayed.    Estimated Creatinine Clearance: 39.6 mL/min (A) (by C-G formula based on SCr of 1.75 mg/dL (H)).   Medical History: Past Medical History:  Diagnosis Date  . Arthritis   . Atrial fibrillation ([place])   . Atypical chest pain 12/02/2015  . CAD (coronary artery disease)    Stents to unknown arteries in 2006,2009  . Cancer ([place])    melanoma  . COPD (chronic obstructive pulmonary disease) ([place])   . CVA (cerebral infarction) 10/9379   Embolic Left sided hemiplegia.   . Diabetes mellitus without complication ([place])   . Dysphagia   . GERD (gastroesophageal reflux disease)   . Goals of care, counseling/discussion 12/02/2015  . Hemiplegia ([place])    left sided  . Hemiplegia affecting left nondominant side ([place]) 12/02/2015  . Hip pain 03/04/2016  . Hypertension   . Ischemic cardiomyopathy    EF of 40-45%  . MRSA bacteremia 12/02/2015  . Personal history of malignant melanoma of skin   . Rash and nonspecific skin eruption 12/02/2015  . Thyroid disease     Medications:  Medications Prior to Admission  Medication Sig Dispense Refill Last Dose  . albuterol  (PROVENTIL HFA;VENTOLIN HFA) 108 (90 BASE) MCG/ACT inhaler Inhale 1-2 puffs into the lungs every 6 (six) hours as needed for wheezing or shortness of breath. 1 Inhaler 0 unknown  . alum & mag hydroxide-simeth (MAALOX REGULAR STRENGTH) 200-200-20 MG/5ML suspension Take 30 mLs by mouth every 6 (six) hours as needed for heartburn.   unknown  . Amino Acids-Protein Hydrolys (FEEDING SUPPLEMENT, PRO-STAT SUGAR FREE 64,) LIQD Take 30 mLs by mouth 2 (two) times daily.   08/27/2017 at Unknown time  . apixaban (ELIQUIS) 5 MG TABS tablet Take 5 mg by mouth 2 (two) times daily.    08/27/2017 at [place]  . cholecalciferol (VITAMIN D) 1000 UNITS tablet Take 1,000 Units by mouth daily.    08/27/2017 at Unknown time  . clotrimazole-betamethasone (LOTRISONE) cream Apply 1 application topically See admin instructions. Applied to face twice daily on MWF   08/26/2017 at Unknown time  . dofetilide (TIKOSYN) 250 MCG capsule Take 250 mcg by mouth 2 (two) times daily.   08/27/2017 at [place]  . famotidine (PEPCID) 20 MG tablet Take 20 mg  by mouth 2 (two) times daily.    08/27/2017 at Unknown time  . ferrous sulfate 325 (65 FE) MG tablet Take 325 mg by mouth 2 (two) times daily with a meal.   08/27/2017 at Unknown time  . HYDROcodone-acetaminophen (NORCO/VICODIN) 5-325 MG per tablet Take 1 tablet by mouth every 8 (eight) hours as needed for moderate pain.    unknown  . ipratropium-albuterol (DUONEB) 0.5-2.5 (3) MG/3ML SOLN Take 3 mLs by nebulization 3 (three) times daily.   08/27/2017 at 1200  . isosorbide mononitrate (IMDUR) 15 mg TB24 24 hr tablet Take 15 mg by mouth daily.   08/27/2017 at Unknown time  . levothyroxine (SYNTHROID, LEVOTHROID) 137 MCG tablet Take 137 mcg by mouth every evening.    08/26/2017 at Unknown time  . magnesium hydroxide (MILK OF MAGNESIA) 400 MG/5ML suspension Take 30 mLs by mouth daily as needed for mild constipation.   unknown  . Melatonin 3 MG TABS Take 3 mg by mouth at bedtime.   08/26/2017 at Unknown time  . metoprolol  (LOPRESSOR) 100 MG tablet Take 100 mg by mouth 2 (two) times daily.    08/27/2017 at [place]  . OXYGEN Inhale 2 L into the lungs daily as needed (for shortness of breath).    unknown  . PARoxetine (PAXIL) 40 MG tablet Take 40 mg by mouth every evening.   08/26/2017 at Unknown time  . phenol (CHLORASEPTIC) 1.4 % LIQD Use as directed 2 sprays in the mouth or throat every 4 (four) hours as needed for throat irritation / pain.   unknown  . polyethylene glycol powder (MIRALAX) powder Take 17 g by mouth daily.    08/27/2017 at Unknown time  . pravastatin (PRAVACHOL) 40 MG tablet Take 40 mg by mouth at bedtime.    08/26/2017 at Unknown time  . sitaGLIPtin (JANUVIA) 50 MG tablet Take 50 mg by mouth daily.   08/27/2017 at Unknown time  . traZODone (DESYREL) 50 MG tablet Take 50 mg by mouth at bedtime.    08/26/2017 at Unknown time    Assessment: 83 yo male that presented with severe sepsis 6/8. Patient with acute hypoxemic respiratory failure, and acute metabolic encephalopathy. Due to inadequate oral intake and encephalopathy. Patient is chronically anticoagulated for afib with eliquis, but unable to take at this time. Last dose 6/8 at 0800. Pharmacy asked to start heparin. Will not load, d/t eliquis. Will monitor APTT for adjustments, d/t likelihood that anti-Xa levels will be altered.  ptt therapeutic at 61. Will monitor again in AM along with heparin level to make sure they correlate.  Goal of Therapy:  Heparin level 0.3-0.7 units/ml aPTT 66-102 seconds Monitor platelets by anticoagulation protocol: Yes   Plan:  Continue heparin infusion @ 800 units/hr Check anti-Xa level daily while on heparin Continue to monitor H&H and platelets.   Margot Ables, PharmD Clinical Pharmacist 08/29/2017 5:04 PM

## 2017-08-29 NOTE — Progress Notes (Signed)
Family requested Palliative Care consult to discuss options for after hospitalization. Quinn Axe, NP notified and will follow.

## 2017-08-29 NOTE — Progress Notes (Signed)
Dr. Manuella Ghazi made aware of PTT level of 180. Pharmacy to adjust.

## 2017-08-29 NOTE — Progress Notes (Signed)
RN walked in room and noted that pts family was feeding him a cheeseburger. Pts grandson came out to desk and stated that pt was probably going to comfort care at [facility] and that is why he was eating. Yolanda Bonine stated that pts daughter thought this RN was giving her the "evil eye" because she was feeding pt. This RN stated that as of now pt is not comfort care- if pt aspirates on food it will make his condition worse and there is a Palliative meeting tomorrow to probably discuss pts status- but as of now pt is NPO status.  RN educated on s/s of aspiration. Grandson verbalized understanding.

## 2017-08-29 NOTE — Progress Notes (Signed)
ANTICOAGULATION CONSULT NOTE -follow up [place] for Heparin Indication: atrial fibrillation  No Known Allergies  Patient Measurements: Height: 6' (182.9 cm) Weight: 209 lb 14.1 oz (95.2 kg) IBW/kg (Calculated) : 77.6 HEPARIN DW (KG): 88  Vital Signs: Temp: 97.7 F (36.5 C) (06/10 0530) Temp Source: Axillary (06/10 0057) BP: 112/59 (06/10 0530) Pulse Rate: 63 (06/10 0530)  Labs: Recent Labs    08/27/17 1838 08/28/17 0525 08/28/17 1314 08/28/17 2136 08/29/17 0455  HGB 13.9 13.3  --   --  11.9*  HCT 46.5 46.8  --   --  40.6  PLT 382 310  --   --  352  APTT  --   --  40* >200* 180*  HEPARINUNFRC  --   --  >2.20*  --   --   CREATININE 1.57* 1.57*  --   --  1.75*  TROPONINI <0.03  --   --   --   --     Estimated Creatinine Clearance: 39.6 mL/min (A) (by C-G formula based on SCr of 1.75 mg/dL (H)).   Medical History: Past Medical History:  Diagnosis Date  . Arthritis   . Atrial fibrillation ([place])   . Atypical chest pain 12/02/2015  . CAD (coronary artery disease)    Stents to unknown arteries in 2006,2009  . Cancer ([place])    melanoma  . COPD (chronic obstructive pulmonary disease) ([place])   . CVA (cerebral infarction) 08/2374   Embolic Left sided hemiplegia.   . Diabetes mellitus without complication ([place])   . Dysphagia   . GERD (gastroesophageal reflux disease)   . Goals of care, counseling/discussion 12/02/2015  . Hemiplegia ([place])    left sided  . Hemiplegia affecting left nondominant side ([place]) 12/02/2015  . Hip pain 03/04/2016  . Hypertension   . Ischemic cardiomyopathy    EF of 40-45%  . MRSA bacteremia 12/02/2015  . Personal history of malignant melanoma of skin   . Rash and nonspecific skin eruption 12/02/2015  . Thyroid disease     Medications:  Medications Prior to Admission  Medication Sig Dispense Refill Last Dose  . albuterol (PROVENTIL HFA;VENTOLIN HFA) 108 (90 BASE) MCG/ACT inhaler Inhale 1-2 puffs into the lungs every 6 (six)  hours as needed for wheezing or shortness of breath. 1 Inhaler 0 unknown  . alum & mag hydroxide-simeth (MAALOX REGULAR STRENGTH) 200-200-20 MG/5ML suspension Take 30 mLs by mouth every 6 (six) hours as needed for heartburn.   unknown  . Amino Acids-Protein Hydrolys (FEEDING SUPPLEMENT, PRO-STAT SUGAR FREE 64,) LIQD Take 30 mLs by mouth 2 (two) times daily.   08/27/2017 at Unknown time  . apixaban (ELIQUIS) 5 MG TABS tablet Take 5 mg by mouth 2 (two) times daily.    08/27/2017 at [place]  . cholecalciferol (VITAMIN D) 1000 UNITS tablet Take 1,000 Units by mouth daily.    08/27/2017 at Unknown time  . clotrimazole-betamethasone (LOTRISONE) cream Apply 1 application topically See admin instructions. Applied to face twice daily on MWF   08/26/2017 at Unknown time  . dofetilide (TIKOSYN) 250 MCG capsule Take 250 mcg by mouth 2 (two) times daily.   08/27/2017 at [place]  . famotidine (PEPCID) 20 MG tablet Take 20 mg by mouth 2 (two) times daily.    08/27/2017 at Unknown time  . ferrous sulfate 325 (65 FE) MG tablet Take 325 mg by mouth 2 (two) times daily with a meal.   08/27/2017 at Unknown time  . HYDROcodone-acetaminophen (NORCO/VICODIN) 5-325 MG per tablet  Take 1 tablet by mouth every 8 (eight) hours as needed for moderate pain.    unknown  . ipratropium-albuterol (DUONEB) 0.5-2.5 (3) MG/3ML SOLN Take 3 mLs by nebulization 3 (three) times daily.   08/27/2017 at 1200  . isosorbide mononitrate (IMDUR) 15 mg TB24 24 hr tablet Take 15 mg by mouth daily.   08/27/2017 at Unknown time  . levothyroxine (SYNTHROID, LEVOTHROID) 137 MCG tablet Take 137 mcg by mouth every evening.    08/26/2017 at Unknown time  . magnesium hydroxide (MILK OF MAGNESIA) 400 MG/5ML suspension Take 30 mLs by mouth daily as needed for mild constipation.   unknown  . Melatonin 3 MG TABS Take 3 mg by mouth at bedtime.   08/26/2017 at Unknown time  . metoprolol (LOPRESSOR) 100 MG tablet Take 100 mg by mouth 2 (two) times daily.    08/27/2017 at [place]  . OXYGEN Inhale  2 L into the lungs daily as needed (for shortness of breath).    unknown  . PARoxetine (PAXIL) 40 MG tablet Take 40 mg by mouth every evening.   08/26/2017 at Unknown time  . phenol (CHLORASEPTIC) 1.4 % LIQD Use as directed 2 sprays in the mouth or throat every 4 (four) hours as needed for throat irritation / pain.   unknown  . polyethylene glycol powder (MIRALAX) powder Take 17 g by mouth daily.    08/27/2017 at Unknown time  . pravastatin (PRAVACHOL) 40 MG tablet Take 40 mg by mouth at bedtime.    08/26/2017 at Unknown time  . sitaGLIPtin (JANUVIA) 50 MG tablet Take 50 mg by mouth daily.   08/27/2017 at Unknown time  . traZODone (DESYREL) 50 MG tablet Take 50 mg by mouth at bedtime.    08/26/2017 at Unknown time    Assessment: 83 yo male that presented with severe sepsis 6/8. Patient with acute hypoxemic respiratory failure, and acute metabolic encephalopathy. Due to inadequate oral intake and encephalopathy. Patient is chronically anticoagulated for afib with eliquis, but unable to take at this time. Last dose 6/8 at 0800. Pharmacy asked to start heparin. Will not load, d/t eliquis. Will monitor APTT for adjustments, d/t likelihood that anti-Xa levels will be altered. APTT =180  Goal of Therapy:  Heparin level 0.3-0.7 units/ml aPTT 66-102 seconds Monitor platelets by anticoagulation protocol: Yes   Plan:  Hold heparin for 1 hour, then decrease heparin infusion at 800 units/hr Check APTT and anti-Xa level in 8 hours and daily while on heparin Continue to monitor H&H and platelets Isac Sarna, BS Vena [place], BCPS Clinical Pharmacist Pager [phone] 08/29/2017,8:07 AM

## 2017-08-29 NOTE — Care Management Important Message (Signed)
Important Message  Patient Details  Name: Alejandro Diaz MRN: [id number] Date of Birth: 1934-09-22   Medicare Important Message Given:  Yes    [name] 08/29/2017, 11:19 AM

## 2017-08-30 ENCOUNTER — Inpatient Hospital Stay (HOSPITAL_COMMUNITY): Payer: Medicare Other

## 2017-08-30 LAB — PHOSPHORUS: Phosphorus: 2.2 mg/dL — ABNORMAL LOW (ref 2.5–4.6)

## 2017-08-30 LAB — COMPREHENSIVE METABOLIC PANEL
ALBUMIN: 2.2 g/dL — AB (ref 3.5–5.0)
ALT: 189 U/L — ABNORMAL HIGH (ref 17–63)
ANION GAP: 7 (ref 5–15)
AST: 215 U/L — ABNORMAL HIGH (ref 15–41)
Alkaline Phosphatase: 71 U/L (ref 38–126)
BILIRUBIN TOTAL: 0.9 mg/dL (ref 0.3–1.2)
BUN: 27 mg/dL — AB (ref 6–20)
CALCIUM: 8.3 mg/dL — AB (ref 8.9–10.3)
CO2: 25 mmol/L (ref 22–32)
Chloride: 111 mmol/L (ref 101–111)
Creatinine, Ser: 1.52 mg/dL — ABNORMAL HIGH (ref 0.61–1.24)
GFR calc Af Amer: 48 mL/min — ABNORMAL LOW (ref >60)
GFR, EST NON AFRICAN AMERICAN: 41 mL/min — AB (ref >60)
Glucose, Bld: 143 mg/dL — ABNORMAL HIGH (ref 65–99)
POTASSIUM: 3.8 mmol/L (ref 3.5–5.1)
SODIUM: 143 mmol/L (ref 135–145)
TOTAL PROTEIN: 5.6 g/dL — AB (ref 6.5–8.1)

## 2017-08-30 LAB — APTT: aPTT: 71 seconds — ABNORMAL HIGH (ref 24–36)

## 2017-08-30 LAB — CBC
HEMATOCRIT: 37.8 % — AB (ref 39.0–52.0)
HEMOGLOBIN: 11.4 g/dL — AB (ref 13.0–17.0)
MCH: 30.2 pg (ref 26.0–34.0)
MCHC: 30.2 g/dL (ref 30.0–36.0)
MCV: 100 fL (ref 78.0–100.0)
PLATELETS: 331 10*3/uL (ref 150–400)
RBC: 3.78 MIL/uL — AB (ref 4.22–5.81)
RDW: 15.2 % (ref 11.5–15.5)
WBC: 11.3 10*3/uL — AB (ref 4.0–10.5)

## 2017-08-30 LAB — GLUCOSE, CAPILLARY
GLUCOSE-CAPILLARY: 116 mg/dL — AB (ref 65–99)
GLUCOSE-CAPILLARY: 104 mg/dL — AB (ref 65–99)
Glucose-Capillary: 115 mg/dL — ABNORMAL HIGH (ref 65–99)

## 2017-08-30 LAB — MAGNESIUM: MAGNESIUM: 1.9 mg/dL (ref 1.7–2.4)

## 2017-08-30 LAB — LACTIC ACID, PLASMA: LACTIC ACID, VENOUS: 1.1 mmol/L (ref 0.5–1.9)

## 2017-08-30 LAB — TROPONIN I
Troponin I: <0.03 ng/mL (ref <0.03)
Troponin I: <0.03 ng/mL (ref <0.03)

## 2017-08-30 LAB — HEPARIN LEVEL (UNFRACTIONATED): Heparin Unfractionated: >2.2 IU/mL — ABNORMAL HIGH (ref 0.30–0.70)

## 2017-08-30 MED ORDER — IPRATROPIUM-ALBUTEROL 0.5-2.5 (3) MG/3ML IN SOLN: 3.0000 mL | Freq: Four times a day (QID) | RESPIRATORY_TRACT | Status: DC | PRN

## 2017-08-30 MED ORDER — ONDANSETRON HCL 4 MG/2ML IJ SOLN
4.0000 mg | Freq: Four times a day (QID) | INTRAMUSCULAR | Status: DC | PRN
  Administered 2017-08-30: 4 mg via INTRAVENOUS
  Filled 2017-08-30: qty 2

## 2017-08-30 MED ORDER — LORAZEPAM 2 MG/ML IJ SOLN
0.5000 mg | INTRAMUSCULAR | Status: DC | PRN
  Administered 2017-08-31 (×2): 0.5 mg via INTRAVENOUS
  Filled 2017-08-30 (×2): qty 1

## 2017-08-30 MED ORDER — MORPHINE SULFATE (CONCENTRATE) 10 MG/0.5ML PO SOLN
5.0000 mg | ORAL | Status: DC | PRN
  Administered 2017-08-30 – 2017-08-31 (×3): 5 mg via SUBLINGUAL
  Filled 2017-08-30 (×3): qty 0.5

## 2017-08-30 MED ORDER — POTASSIUM PHOSPHATES 15 MMOLE/5ML IV SOLN
10.0000 mmol | Freq: Once | INTRAVENOUS | Status: DC
  Administered 2017-08-30: 10 mmol via INTRAVENOUS
  Filled 2017-08-30: qty 3.33

## 2017-08-30 NOTE — Progress Notes (Signed)
PROGRESS NOTE    Saron Tweed Heyne  ONG:[id number] DOB: [birth year] DOA: 08/27/2017 PCP: Jani Gravel, MD   Brief Narrative:   Alejandro Diaz is a 83 y.o. male with a history of atrial fibrillation on anticoagulation, CAD with stents in 2006 and 2009, COPD, history of embolic stroke with residual left hemiplegia, diabetes, GERD, hypertension, ischemic cardiomyopathy with EF of 40 to 45%, hypothyroidism.  Patient resides at [facility] .  He presented with worsening dyspnea and was thought to be volume overloaded.  He was actually admitted with severe sepsis with right lower lobe pneumonia suspected secondary to aspiration.  He is a DNR and patient's family do not wish for him to have any aggressive measures.  He has been placed on IV vancomycin and Zosyn for treatment and has had worsening hypotension and oliguria overnight for which he has received some IV fluid and albumin.  He has had some continued improvement with treatment but overall has a very poor prognosis and has been seen by palliative care.  Discussions between palliative care and family members revealed that family does not want the patient to have any prolonged suffering and therefore, they have elected comfort measures only on 6/11.  He has been placed on comfort medications with antibiotics discontinued.  He is awaiting placement to residential hospice facility in [place].   Assessment & Plan:   Principal Problem:   Severe sepsis ([place]) Active Problems:   AF (atrial fibrillation) (HCC)   Essential hypertension   Pressure ulcer   Anticoagulated   Hemiplegia affecting left nondominant side (HCC)   CAD (coronary artery disease)   COPD (chronic obstructive pulmonary disease) (HCC)   Disease of thyroid gland   DM type 2 (diabetes mellitus, type 2) (HCC)   GERD (gastroesophageal reflux disease)   RLL pneumonia ([place])   Palliative care by specialist   Encounter for hospice care discussion   1. Severe sepsis secondary to right lower lobe  pneumonia-suspect aspiration.   2. Acute metabolic encephalopathy secondary to above.  He is more alert, but confused.  3. Severe systolic dysfunction. Related to above. LVEF 25% on echo. 4. Transaminitis.  This is likely secondary to liver shock from above processes.   5. Acute hypoxemic respiratory failure.  This is likely related to the pneumonia process as well as some acute lung injury which is likely taken place.   6. Atrial fibrillation with some RVR. 7. COPD.  Nebulizer treatments with Xopenex/ipratropium prn. 8. Diabetes.   9. GERD.   10. Hypertension.  Plan: Continue on comfort measures.  DVT prophylaxis:None Code Status: DNR/Comfort measures only Family Communication: Daughter at bedside Disposition Plan: Comfort measures; plan to DC to [facility] residential hospice once bed available; hopefully in am.   Consultants:   None  Procedures:   None  Antimicrobials:   Vancomycin and Zosyn 6/8->6/11   Subjective: Patient seen and evaluated today.  No acute events noted overnight. Family members have elected comfort measures only after significant discussion with palliative care.  Objective: Vitals:   08/30/17 1300 08/30/17 1421 08/30/17 1432 08/30/17 1607  BP: 131/70     Pulse: 89     Resp: 17     Temp: 97.9 F (36.6 C)   98.1 F (36.7 C)  TempSrc:    Bladder  SpO2: 100% 100% 100%   Weight:      Height:        Intake/Output Summary (Last 24 hours) at 08/30/2017 1628 Last data filed at 08/30/2017 1500 Gross per  24 hour  Intake 3897.34 ml  Output 550 ml  Net 3347.34 ml   Filed Weights   08/27/17 1819 08/28/17 0500 08/30/17 0500  Weight: 88 kg (194 lb) 95.2 kg (209 lb 14.1 oz) 99.7 kg (219 lb 12.8 oz)    Examination:  General exam: Arousable, but somnolent. Respiratory system: Clear to auscultation. Respiratory effort normal.  On nonrebreather with high oxygen requirements. Cardiovascular system: Irregular. No JVD, murmurs, rubs, gallops or clicks. No  pedal edema. Gastrointestinal system: Abdomen is nondistended, soft and nontender. No organomegaly or masses felt. Normal bowel sounds heard. Extremities: Cannot be fully assessed at this time. Skin: No rashes, lesions or ulcers    Data Reviewed: I have personally reviewed following labs and imaging studies  CBC: Recent Labs  Lab 08/27/17 1838 08/28/17 0525 08/29/17 0455 08/30/17 0204  WBC 10.2 16.4* 11.9* 11.3*  NEUTROABS 7.6  --   --   --   HGB 13.9 13.3 11.9* 11.4*  HCT 46.5 46.8 40.6 37.8*  MCV 99.8 103.3* 101.2* 100.0  PLT 382 310 352 409   Basic Metabolic Panel: Recent Labs  Lab 08/27/17 1838 08/28/17 0525 08/29/17 0455 08/30/17 0204  NA 142 144 145 143  K 5.5* 5.5* 4.5 3.8  CL 100* 109 111 111  CO2 32 25 25 25   GLUCOSE 161* 126* 117* 143*  BUN 21* 22* 30* 27*  CREATININE 1.57* 1.57* 1.75* 1.52*  CALCIUM 9.8 8.8* 8.5* 8.3*  MG  --   --   --  1.9  PHOS  --   --   --  2.2*   GFR: Estimated Creatinine Clearance: 46.6 mL/min (A) (by C-G formula based on SCr of 1.52 mg/dL (H)). Liver Function Tests: Recent Labs  Lab 08/27/17 1838 08/29/17 0455 08/30/17 0204  AST 57* 440* 215*  ALT 25 252* 189*  ALKPHOS 88 76 71  BILITOT 1.0 1.0 0.9  PROT 7.7 6.0* 5.6*  ALBUMIN 2.8* 2.4* 2.2*   No results for input(s): LIPASE, AMYLASE in the last 168 hours. Recent Labs  Lab 08/29/17 1251  AMMONIA 25   Coagulation Profile: No results for input(s): INR, PROTIME in the last 168 hours. Cardiac Enzymes: Recent Labs  Lab 08/27/17 1838 08/30/17 0204 08/30/17 0733  TROPONINI <0.03 <0.03 <0.03   BNP (last 3 results) No results for input(s): PROBNP in the last 8760 hours. HbA1C: No results for input(s): HGBA1C in the last 72 hours. CBG: Recent Labs  Lab 08/29/17 1705 08/29/17 2129 08/30/17 0350 08/30/17 0754 08/30/17 1137  GLUCAP 110* 127* 115* 116* 104*   Lipid Profile: No results for input(s): CHOL, HDL, LDLCALC, TRIG, CHOLHDL, LDLDIRECT in the last 72  hours. Thyroid Function Tests: No results for input(s): TSH, T4TOTAL, FREET4, T3FREE, THYROIDAB in the last 72 hours. Anemia Panel: No results for input(s): VITAMINB12, FOLATE, FERRITIN, TIBC, IRON, RETICCTPCT in the last 72 hours. Sepsis Labs: Recent Labs  Lab 08/27/17 2057  08/28/17 0525 08/28/17 0733 08/28/17 1039 08/29/17 0455 08/30/17 0204  PROCALCITON 0.23  --  0.51  --   --  0.68  --   LATICACIDVEN 2.4*   < >  --  2.4* 2.4* 1.6 1.1   < > = values in this interval not displayed.    Recent Results (from the past 240 hour(s))  Urine culture     Status: None   Collection Time: 08/27/17  6:38 PM  Result Value Ref Range Status   Specimen Description   Final    URINE, CATHETERIZED Performed at  [facility], [address], [place], [place] [address]    Special Requests   Final    NONE Performed at [facility], [address], [place], [place] [address]    Culture   Final    NO GROWTH Performed at [facility], [place] [address], [place], [place] [address]    Report Status 08/29/2017 FINAL  Final  Blood Culture (routine x 2)     Status: None (Preliminary result)   Collection Time: 08/27/17  6:50 PM  Result Value Ref Range Status   Specimen Description BLOOD RIGHT HAND  Final   Special Requests   Final    BOTTLES DRAWN AEROBIC ONLY Blood Culture results may not be optimal due to an inadequate volume of blood received in culture bottles   Culture   Final    NO GROWTH 3 DAYS Performed at [facility], [address] [place], [place] [address]    Report Status PENDING  Incomplete  MRSA PCR Screening     Status: Abnormal   Collection Time: 08/27/17  9:28 PM  Result Value Ref Range Status   MRSA by PCR POSITIVE (A) NEGATIVE Final    Comment:        The GeneXpert MRSA Assay (FDA approved for NASAL specimens only), is one component of a comprehensive MRSA colonization surveillance program. It is not intended to diagnose MRSA infection nor to guide or monitor  treatment for MRSA infections. RESULT CALLED TO, READ BACK BY AND VERIFIED WITH: WAGNER,R @ 0217 ON 08/28/17 BY JUW Performed at [facility], [address], [place], [place] [address]          Radiology Studies: Dg Chest [facility] 1 View  Result Date: 08/30/2017 CLINICAL DATA:  Pneumonia.  Chest pain. EXAM: PORTABLE CHEST 1 VIEW COMPARISON:  Radiographs 08/27/2017 FINDINGS: Right upper extremity PICC with tip in the mid SVC. Worsening right basilar opacity. Right pleural effusion versus pleural thickening. Unchanged heart size and mediastinal contours with mild cardiomegaly. Mild left basilar atelectasis and possible tiny left pleural effusion. There is vascular congestion. No pneumothorax. IMPRESSION: 1. Worsening right lung base opacity, suspicious for worsening pneumonia. Again seen right pleural effusion versus pleural thickening. Recommend radiographic follow-up to resolution. 2. Increasing left basilar atelectasis. Possible small left pleural effusion. 3. Developing vascular congestion. Electronically Signed   By: Jeb Levering M.D.   On: 08/30/2017 02:40        Scheduled Meds: . mouth rinse  15 mL Mouth Rinse BID   Continuous Infusions: . sodium chloride 10 mL/hr at 08/30/17 1600     LOS: 3 days    Time spent: 30 minutes    Nelli Swalley Darleen Crocker, DO [facility] Pager [phone]  If 7PM-7AM, please contact night-coverage [email or web address] Password [facility] 08/30/2017, 4:28 PM

## 2017-08-30 NOTE — Progress Notes (Addendum)
Night shift floor coverage note.  The patient was seen last night due to chest pain.  He stated he was not similar to his ACS type of pain.  Most recent vital signs at that time temperature 98.2, pulse 87, respirations 20, blood pressure 114/69 mmHg and O2 sat 100% on nasal cannula oxygen.  General: Looks chronically ill, in NAD.  Looks depressed. Heent: Normocephalic, no icterus. Neck: Supple, no JVD. Lungs: Absent breath sounds RLL, decreased breath sounds on LLL.  Mild rhonchi bilaterally.  No wheezing. Cardiovascular: S1, S2, trace pitting edema lower extremities. Abdomen: Nondistended, soft, nontender. Extremities: No clubbing, no cyanosis. Neuro: Left sided hemiparesis/plegia    EKG Vent. rate 91 BPM PR interval * ms QRS duration 90 ms QT/QTc 370/455 ms P-R-T axes * -20 123 Atrial fibrillation with premature ventricular or aberrantly conducted complexes Inferior infarct , age undetermined Possible Anterior infarct , age undetermined Abnormal ECG   Collected: 08/30/17 0204  Updated: 08/30/17 0236   Specimen Type: Blood    Troponin I <0.03 ng/mL  Magnesium [[id number]] Collected: 08/30/17 0204  Updated: 08/30/17 0236   Specimen Type: Blood    Magnesium 1.9 mg/dL  Phosphorus [[id number]] (Abnormal) Collected: 08/30/17 0204  Updated: 08/30/17 0236   Specimen Type: Blood    Phosphorus 2.2Low  mg/dL  Comprehensive metabolic panel [[id number]] (Abnormal) Collected: 08/30/17 0204  Updated: 08/30/17 0231   Specimen Type: Blood    Sodium 143 mmol/L   Potassium 3.8 mmol/L   Chloride 111 mmol/L   CO2 25 mmol/L   Glucose, Bld 143High  mg/dL   BUN 27High  mg/dL   Creatinine, Ser 1.52High  mg/dL   Calcium 8.3Low  mg/dL   Total Protein 5.6Low  g/dL   Albumin 2.2Low  g/dL   AST 215High  U/L   ALT 189High  U/L   Alkaline Phosphatase 71 U/L   Total Bilirubin 0.9 mg/dL   GFR calc non Af Amer 41Low  mL/min   GFR calc Af Amer 48Low  mL/min   Anion gap 7  Lactic acid, plasma  [[id number]] Collected: 08/30/17 0204  Updated: 08/30/17 0229   Specimen Type: Blood    Lactic Acid, Venous 1.1 mmol/L  CBC [[id number]] (Abnormal) Collected: 08/30/17 0204  Updated: 08/30/17 0219   Specimen Type: Blood    WBC 11.3High  K/uL   RBC 3.78Low  MIL/uL   Hemoglobin 11.4Low  g/dL   HCT 37.8Low  %   MCV 100.0 fL   MCH 30.2 pg   MCHC 30.2 g/dL   RDW 15.2 %   Platelets 331 K/uL   PORTABLE CHEST 1 VIEW  CLINICAL DATA:  Pneumonia.  Chest pain.  COMPARISON:  Radiographs 08/27/2017  FINDINGS: Right upper extremity PICC with tip in the mid SVC. Worsening right basilar opacity. Right pleural effusion versus pleural thickening. Unchanged heart size and mediastinal contours with mild cardiomegaly. Mild left basilar atelectasis and possible tiny left pleural effusion. There is vascular congestion. No pneumothorax.  IMPRESSION: 1. Worsening right lung base opacity, suspicious for worsening pneumonia. Again seen right pleural effusion versus pleural thickening. Recommend radiographic follow-up to resolution. 2. Increasing left basilar atelectasis. Possible small left pleural effusion. 3. Developing vascular congestion. --------------------------------------------------------------------------------------------------------------------------- Assessment:  Chest Pain. Likely noncardiac. Trend troponin levels. Continue current treatment. Case and prognosis discussed at length with his daughter and son-in-law.  Hypophosphatemia Patient is currently n.p.o. No IV potassium phosphate available at this time. Parental replacement ordered for later this morning when the infusion can be prepared.  Tennis Must, MD  About 45 minutes of critical care time were used during this emergent event.  This document was prepared using Dragon voice recognition software may contain some unintended errors.

## 2017-08-30 NOTE — Clinical Social Work Note (Signed)
Patient Information   Patient Name Alejandro Diaz, Alejandro Diaz ([id number]) Sex Male DOB 1934-10-29 SSN [id number]   Room Bed  IC05 IC05-01  Patient Demographics   Address [place] [address] Phone [phone] (Home)  Patient Ethnicity & Race   Ethnic Group Patient Race  Not Hispanic or Latino White or Caucasian  Emergency Contact(s)   Name Relation Home Work [place] Son [phone]  [phone]  [place] Daughter [phone]  [phone]  Documents on File    Status Date Received Description  Documents for the Patient  [place] Not Received    [place] E-Signature HIPAA Notice of Privacy Received 03/50/09   Driver's License Not Received    Insurance Card Not Received    Advance Directives/Living Will/HCPOA/POA Not Received    Insurance Card  06/16/13 BCBS MCR  HIM ROI Authorization  06/22/13   Release of Information  06/23/13   Other Photo ID Not Received    AMB Outside Hospital Record  09/22/14 [facility]  AMB Correspondence  09/03/14 SOAP NOTE KIM MD, J  HIM ROI Authorization  03/13/15   HIM ROI Authorization (Expired) 11/14/15 [facility] [place] [facility]  AMB Correspondence  11/06/15 SOAP NOTE KIM MD, J  AMB HH/NH/Hospice  11/29/15 ORDER SUMMARY REPORT [facility] AT [place]  AMB Correspondence (Deleted) 09/22/14 SOAP NOTE KIM MD, J  Documents for the Encounter  AOB (Assignment of Insurance Benefits) Not Received    E-signature AOB Signed 08/27/17   MEDICARE RIGHTS Not Received    E-signature Medicare Rights Signed 08/27/17   ED Patient Billing Extract   ED PB Billing Extract  Cardiac Monitoring Strip Shift Summary Received 08/27/17   Cardiac Monitoring Strip Received 08/28/17   EKG Received 08/29/17   Admission Information   Attending Provider Admitting Provider Admission Type Admission Date/Time  Rodena Goldmann, DO Truett Mainland, DO Emergency 08/27/17 1818  Discharge Date  Hospital Service Auth/Cert Status Service Area   Internal Medicine Incomplete [facility]  Unit Room/Bed Admission Status   AP-ICCUP NURSING IC05/IC05-01 Admission (Confirmed)   Admission   Complaint  .  Hospital Account   Name Acct ID Class Status Primary Coverage  Diaz, Alejandro Sokolov [id number] Inpatient Open MEDICARE - MEDICARE PART A AND B      Guarantor Account (for Hospital Account [id number])   Name Relation to Pt Service Area Active? Acct Type  Diaz, Alejandro Hoops Self CHSA Yes Personal/Family  Address Phone    [address] [place], [place] [address] [phone])        Coverage Information (for Hospital Account [id number])   1. [place] PART A AND B   F/O Payor/Plan Precert #  MEDICARE/MEDICARE PART A AND B   Subscriber Subscriber #  Diaz, Alejandro Maciolek [id number]  Address Phone  [address] [place], [place] [address]   2. MEDICAID [place]MEDICA[place]   F/O Payor/Plan Precert #  MEDICAID [place]/MEDICAID OF [place]   Subscriber Subscriber #  Diaz, Alejandro Statz [id number]  Address Phone  [address] [place], [place] [address] [phone]

## 2017-08-30 NOTE — Progress Notes (Signed)
Palliative: Alejandro Diaz is lying quietly in bed.  He will briefly open his eyes, but not make eye contact.  He seems mildly uncomfortable, but is unable to verbalize any discomfort at this time.  Present today at bedside is daughter Alejandro Diaz and her husband Alejandro Diaz, son Alejandro Diaz and his wife Alejandro Diaz.  We go to my office for family meeting. We talked about Alejandro Diaz acute and chronic health history in detail.  We review his labs and images in detail.  We talked about his life over the last 3 years, in the SNF.  Family states that he looks the same but "tired".  We talked about the current treatment plan in detail.  Family states that they would not accept PEG tube for their father. Share a diagram of the chronic illness pathway, what is normal and expected.  We talked about how to make choices for loved ones including 1) keeping them at the center of decision-making, 2) are we doing something for them or to them, can we change what is happening 3) what would the person Alejandro Diaz was 10 years ago say about where he is now.   Family states that he has been seeing his wife (passed approximately 4 years ago) since he has had the stroke, but over the last few days he shares that his wife and he will be traveling, she has packed a suitcase.  Family also states that he is reaching up toward the ceiling. Family states that they see his suffering, Alejandro Diaz states that she questions now whether she should have allowed the surgery to remove the blood clot/stroke 3 years ago.  We talked about residential hospice in detail.  Family is endorsing residential hospice with Alejandro Diaz for comfort and dignity at end of life.  We talked in detail about what is and is not provided in residential hospice.  Alejandro Diaz states that she has a cousin with experience in residential hospice.  We talked about modern medicine extending life but also extending the dying process and thereby suffering.  I encouraged family to be together, consider choices.   I will follow-up in an hour or 2.  All questions answered. I returned to Alejandro Diaz room later.  Family endorses comfort and dignity, full comfort care, residential hospice with Alejandro Diaz. Hospitalist and Education officer, museum notified. Extended meeting [id number]  100 minutes Alejandro Axe, NP Palliative Medicine Team Team Phone # [phone]

## 2017-08-30 NOTE — Clinical Social Work Note (Signed)
Per palliative care, NP request referral sent to [facility].     Soley Harriss, Clydene Pugh, LCSW r

## 2017-08-30 NOTE — Clinical Social Work Note (Signed)
Patient's referral is being reviewed by [facility] who indicate that they will contact LCSW once a decision has been made.    Austina Constantin, Clydene Pugh, LCSW

## 2017-08-30 NOTE — Progress Notes (Signed)
Pt c/o nauseaTylene Fantasia, NP paged and made aware. Waiting for orders/call back. Will continue to monitor pt

## 2017-08-30 NOTE — Progress Notes (Signed)
ANTICOAGULATION CONSULT NOTE  Pharmacy Consult for Heparin Indication: atrial fibrillation  No Known Allergies  Patient Measurements: Height: 6' (182.9 cm) Weight: 219 lb 12.8 oz (99.7 kg) IBW/kg (Calculated) : 77.6 HEPARIN DW (KG): 88  Vital Signs: Temp: 97.3 F (36.3 C) (06/11 0615) BP: 99/54 (06/11 0615) Pulse Rate: 85 (06/11 0615)  Labs: Recent Labs    08/27/17 1838 08/28/17 0525 08/28/17 1314  08/29/17 0455 08/29/17 1630 08/30/17 0204  HGB 13.9 13.3  --   --  11.9*  --  11.4*  HCT 46.5 46.8  --   --  40.6  --  37.8*  PLT 382 310  --   --  352  --  331  APTT  --   --  40*   < > 180* 81* 71*  HEPARINUNFRC  --   --  >2.20*  --  >2.20*  --  >2.20*  CREATININE 1.57* 1.57*  --   --  1.75*  --  1.52*  TROPONINI <0.03  --   --   --   --   --  <0.03   < > = values in this interval not displayed.   Estimated Creatinine Clearance: 46.6 mL/min (A) (by C-G formula based on SCr of 1.52 mg/dL (H)).  Assessment: 83 yo male that presented with severe sepsis 6/8. Patient with acute hypoxemic respiratory failure, and acute metabolic encephalopathy. Due to inadequate oral intake and encephalopathy. Patient is chronically anticoagulated for afib with eliquis, but unable to take at this time. Last dose 6/8 at 0800.  Monitor aPTT for adjustments, anti-Xa levels still elevated.  aPTT therapeutic at 71. No bleeding noted.  Goal of Therapy:  Heparin level 0.3-0.7 units/ml aPTT 66-102 seconds Monitor platelets by anticoagulation protocol: Yes   Plan:  Continue  heparin infusion at 800 units/hr Check APTT and anti-Xa level in AM Continue to monitor H&H and platelets Monitor aPTT until aPTTs and Heparin levels correlate. Pricilla Larsson, [facility]  08/30/2017 8:01 AM

## 2017-08-30 NOTE — Progress Notes (Signed)
Pt c/o headache and chest pain 9/10- Dr. Olevia Bowens paged and made aware. Pt currently has nothing ordered for pain. HR and BP WNL. Waiting for orders/call back. Will continue to monitor pt

## 2017-08-30 NOTE — Progress Notes (Signed)
Pt c/o CP- After paging Dr. Olevia Bowens he came to room. EKG done. MD in room with pt. Waiting for orders.

## 2017-08-31 DIAGNOSIS — R6520 Severe sepsis without septic shock: Secondary | ICD-10-CM

## 2017-08-31 DIAGNOSIS — J189 Pneumonia, unspecified organism: Secondary | ICD-10-CM

## 2017-08-31 DIAGNOSIS — K219 Gastro-esophageal reflux disease without esophagitis: Secondary | ICD-10-CM

## 2017-08-31 DIAGNOSIS — E079 Disorder of thyroid, unspecified: Secondary | ICD-10-CM

## 2017-08-31 DIAGNOSIS — J181 Lobar pneumonia, unspecified organism: Secondary | ICD-10-CM

## 2017-08-31 DIAGNOSIS — I4891 Unspecified atrial fibrillation: Secondary | ICD-10-CM

## 2017-08-31 DIAGNOSIS — J449 Chronic obstructive pulmonary disease, unspecified: Secondary | ICD-10-CM

## 2017-08-31 DIAGNOSIS — A419 Sepsis, unspecified organism: Principal | ICD-10-CM

## 2017-08-31 DIAGNOSIS — I10 Essential (primary) hypertension: Secondary | ICD-10-CM

## 2017-08-31 MED ORDER — ATROPINE SULFATE 1 % OP SOLN
1.0000 [drp] | OPHTHALMIC | Status: DC | PRN
  Administered 2017-08-31: 1 [drp] via SUBLINGUAL
  Filled 2017-08-31: qty 2

## 2017-08-31 MED ORDER — ATROPINE SULFATE 1 % OP SOLN: 1.0000 [drp] | OPHTHALMIC | 3 refills | Status: AC | PRN

## 2017-08-31 MED ORDER — BISACODYL 10 MG RE SUPP: 10.0000 mg | RECTAL | 0 refills | Status: AC | PRN

## 2017-08-31 MED ORDER — ONDANSETRON HCL 4 MG/2ML IJ SOLN: 4.0000 mg | Freq: Four times a day (QID) | INTRAMUSCULAR | 0 refills | Status: AC | PRN

## 2017-08-31 MED ORDER — MORPHINE SULFATE (CONCENTRATE) 10 MG/0.5ML PO SOLN: 5.0000 mg | ORAL | 0 refills | Status: AC | PRN

## 2017-08-31 MED ORDER — LORAZEPAM 2 MG/ML IJ SOLN: 0.5000 mg | INTRAMUSCULAR | 0 refills | Status: AC | PRN

## 2017-08-31 NOTE — Care Management Important Message (Signed)
Important Message  Patient Details  Name: Alejandro Diaz MRN: [id number] Date of Birth: Mar 26, 1934   Medicare Important Message Given:  Yes    [name] 08/31/2017, 11:56 AM

## 2017-08-31 NOTE — Progress Notes (Signed)
Palliative: Alejandro Diaz is lying quietly in bed, he appears comfortable.  He does not open his eyes when I touch his arm or speak gently.  Present today at bedside his daughter Alejandro Diaz and son-in-law Alejandro Diaz.  They share that [name] had a difficult night, Alejandro Diaz states that she did not understand the benefits/need for morphine.  She shares that nursing staff has helped her understand the benefit of this medication for comfort and symptom management. We continue a life review for [name].  Alejandro Diaz endorses that when medications and treatments were stopped yesterday, she questioned herself.  She states that she realized that that comfort and dignity at end of life is the correct choice.  Eddie states that, in fact, [name] life ended 3 years ago after the stroke.  Alejandro Diaz states that her mother spent 9 days in hospice at [facility].  She continues to share her feelings and experiences with losing her mother in hospice.  Prognosis discussed with family with permission.  Two weeks or less would not be surprising based on frailty, functional decline, aspiration PNE, poor po intake (sips and bites only), admission for sepsis, and family's desire to focus on comfort and dignity only, let nature take it's course.   PPS score 6 mo. Ago was 50% PPS score now 20%  35 minutes Quinn Axe, NP Palliative Medicine Team Team Phone # [phone]
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
## 2017-08-31 NOTE — Progress Notes (Signed)
Nutrition Brief Note  Chart reviewed. Pt now transitioning to comfort care.  No further nutrition interventions warranted at this time.  Please re-consult as needed.   Lynn Caylan Schifano MS,RD,CSG,LDN Office: #[phone] Pager: #[phone]    

## 2017-08-31 NOTE — Clinical Social Work Note (Signed)
Patient accepted at [facility]. Facility request the EMS be arranged for 4:00 p.m . EMS transport arranged. LCSW notified attending and RN. Hospice nurse to call RN to get report.   LCSW signing off.     Fuller Makin, Clydene Pugh, LCSW

## 2017-08-31 NOTE — Progress Notes (Signed)
Provided spiritual and emotional support to patient and family. Family are in agreement with doctor finding regarding end of life. Children are in acceptance.

## 2017-09-01 LAB — CULTURE, BLOOD (ROUTINE X 2): Culture: NO GROWTH

## 2017-09-06 ENCOUNTER — Other Ambulatory Visit: Payer: Self-pay | Admitting: *Deleted

## 2017-09-06 NOTE — Patient Outreach (Signed)
Patient triggered [facility] on [place] Discharge Dashboard, notification sent to:  Sherrin Daisy, RN

## 2017-09-06 NOTE — Patient Outreach (Addendum)
[facility]) Care Management  09/06/2017  Alejandro Diaz 10-Mar-1935 [id number]  Referral via [facility] EMMI -General discharge-Day#1 09/02/17; Day#4. 09/03/2107: Reasons: Day 1-Got discharge papers-"I don't know";  Day 2-Lost of interest in things-"yes"  Per hx review-Patient had hospital admission 6/8-6/02/2018 Was discharged & transferred to [facility] for comfort and end of life care.  Telephone call to [facility]; spoke with Pamala Hurry in medical records that patient had been under their services and was deceased as of 10/01/2017.  Plan: Case closure.  Sherrin Daisy, RN BSN [place] Management Coordinator [facility] Care Management  [phone]

## 2017-09-19 DEATH — deceased
